# Patient Record
Sex: Male | Born: 1950
Health system: Southern US, Community
[De-identification: ages and names within clinical notes are randomized; demographics above are authoritative.]

## PROBLEM LIST (undated history)

## (undated) DIAGNOSIS — Z8709 Personal history of other diseases of the respiratory system: Secondary | ICD-10-CM

## (undated) DIAGNOSIS — I700 Atherosclerosis of aorta: Secondary | ICD-10-CM

## (undated) DIAGNOSIS — I10 Essential (primary) hypertension: Secondary | ICD-10-CM

## (undated) DIAGNOSIS — I8390 Asymptomatic varicose veins of unspecified lower extremity: Secondary | ICD-10-CM

## (undated) DIAGNOSIS — K7689 Other specified diseases of liver: Secondary | ICD-10-CM

## (undated) DIAGNOSIS — K458 Other specified abdominal hernia without obstruction or gangrene: Secondary | ICD-10-CM

## (undated) DIAGNOSIS — K219 Gastro-esophageal reflux disease without esophagitis: Secondary | ICD-10-CM

## (undated) DIAGNOSIS — I219 Acute myocardial infarction, unspecified: Secondary | ICD-10-CM

## (undated) DIAGNOSIS — M1610 Unilateral primary osteoarthritis, unspecified hip: Secondary | ICD-10-CM

## (undated) DIAGNOSIS — I255 Ischemic cardiomyopathy: Secondary | ICD-10-CM

## (undated) DIAGNOSIS — I2510 Atherosclerotic heart disease of native coronary artery without angina pectoris: Secondary | ICD-10-CM

## (undated) DIAGNOSIS — M4306 Spondylolysis, lumbar region: Secondary | ICD-10-CM

## (undated) DIAGNOSIS — K5730 Diverticulosis of large intestine without perforation or abscess without bleeding: Secondary | ICD-10-CM

## (undated) DIAGNOSIS — C801 Malignant (primary) neoplasm, unspecified: Secondary | ICD-10-CM

## (undated) DIAGNOSIS — M5136 Other intervertebral disc degeneration, lumbar region: Secondary | ICD-10-CM

## (undated) DIAGNOSIS — L719 Rosacea, unspecified: Secondary | ICD-10-CM

## (undated) DIAGNOSIS — E785 Hyperlipidemia, unspecified: Secondary | ICD-10-CM

## (undated) HISTORY — PX: OTHER SURGICAL HISTORY: SHX169

## (undated) HISTORY — PX: CHOLECYSTECTOMY: SHX55

## (undated) HISTORY — DX: Rosacea, unspecified: L71.9

## (undated) HISTORY — DX: Hyperlipidemia, unspecified: E78.5

## (undated) HISTORY — DX: Essential (primary) hypertension: I10

## (undated) HISTORY — PX: TONSILLECTOMY: SUR1361

## (undated) HISTORY — DX: Atherosclerotic heart disease of native coronary artery without angina pectoris: I25.10

## (undated) HISTORY — PX: HAND SURGERY: SHX662

---

## 1998-04-05 ENCOUNTER — Ambulatory Visit (HOSPITAL_COMMUNITY): Admission: RE | Admit: 1998-04-05 | Discharge: 1998-04-05 | Payer: Self-pay | Admitting: Specialist

## 1998-04-05 ENCOUNTER — Encounter: Payer: Self-pay | Admitting: Specialist

## 1998-10-31 ENCOUNTER — Encounter: Payer: Self-pay | Admitting: Family Medicine

## 1998-10-31 ENCOUNTER — Encounter: Admission: RE | Admit: 1998-10-31 | Discharge: 1998-10-31 | Payer: Self-pay | Admitting: Family Medicine

## 2001-07-29 ENCOUNTER — Other Ambulatory Visit: Admission: RE | Admit: 2001-07-29 | Discharge: 2001-07-29 | Payer: Self-pay | Admitting: Obstetrics and Gynecology

## 2002-01-03 ENCOUNTER — Ambulatory Visit (HOSPITAL_BASED_OUTPATIENT_CLINIC_OR_DEPARTMENT_OTHER): Admission: RE | Admit: 2002-01-03 | Discharge: 2002-01-03 | Payer: Self-pay | Admitting: *Deleted

## 2002-09-10 ENCOUNTER — Encounter: Payer: Self-pay | Admitting: Emergency Medicine

## 2002-09-10 ENCOUNTER — Inpatient Hospital Stay (HOSPITAL_COMMUNITY): Admission: EM | Admit: 2002-09-10 | Discharge: 2002-09-11 | Payer: Self-pay | Admitting: Emergency Medicine

## 2002-09-10 ENCOUNTER — Encounter: Payer: Self-pay | Admitting: General Surgery

## 2002-09-10 ENCOUNTER — Encounter (INDEPENDENT_AMBULATORY_CARE_PROVIDER_SITE_OTHER): Payer: Self-pay | Admitting: *Deleted

## 2005-06-08 ENCOUNTER — Emergency Department (HOSPITAL_COMMUNITY): Admission: EM | Admit: 2005-06-08 | Discharge: 2005-06-09 | Payer: Self-pay | Admitting: Emergency Medicine

## 2007-01-08 DIAGNOSIS — I219 Acute myocardial infarction, unspecified: Secondary | ICD-10-CM

## 2007-01-08 HISTORY — DX: Acute myocardial infarction, unspecified: I21.9

## 2007-06-08 HISTORY — PX: CORONARY ANGIOPLASTY WITH STENT PLACEMENT: SHX49

## 2007-06-19 ENCOUNTER — Inpatient Hospital Stay (HOSPITAL_COMMUNITY): Admission: EM | Admit: 2007-06-19 | Discharge: 2007-06-23 | Payer: Self-pay | Admitting: Emergency Medicine

## 2007-06-19 ENCOUNTER — Ambulatory Visit: Payer: Self-pay | Admitting: Cardiology

## 2007-06-23 ENCOUNTER — Encounter: Payer: Self-pay | Admitting: Cardiology

## 2007-06-23 ENCOUNTER — Ambulatory Visit: Payer: Self-pay | Admitting: Vascular Surgery

## 2007-07-07 ENCOUNTER — Ambulatory Visit: Payer: Self-pay | Admitting: Cardiology

## 2007-07-09 ENCOUNTER — Encounter (HOSPITAL_COMMUNITY): Admission: RE | Admit: 2007-07-09 | Discharge: 2007-10-14 | Payer: Self-pay | Admitting: Cardiology

## 2007-08-18 ENCOUNTER — Ambulatory Visit: Payer: Self-pay | Admitting: Cardiology

## 2007-08-18 LAB — CONVERTED CEMR LAB
ALT: 21 units/L (ref 0–53)
AST: 20 units/L (ref 0–37)
Albumin: 4 g/dL (ref 3.5–5.2)
Alkaline Phosphatase: 59 units/L (ref 39–117)
Bilirubin, Direct: 0.1 mg/dL (ref 0.0–0.3)
Cholesterol: 123 mg/dL (ref 0–200)
HDL: 40 mg/dL (ref >39.0)
LDL Cholesterol: 69 mg/dL (ref 0–99)
Total Bilirubin: 1 mg/dL (ref 0.3–1.2)
Total CHOL/HDL Ratio: 3.1
Total Protein: 6.4 g/dL (ref 6.0–8.3)
Triglycerides: 68 mg/dL (ref 0–149)
VLDL: 14 mg/dL (ref 0–40)

## 2007-10-13 ENCOUNTER — Encounter: Payer: Self-pay | Admitting: Cardiology

## 2007-10-13 ENCOUNTER — Ambulatory Visit: Payer: Self-pay

## 2007-10-15 ENCOUNTER — Encounter (HOSPITAL_COMMUNITY): Admission: RE | Admit: 2007-10-15 | Discharge: 2007-11-07 | Payer: Self-pay | Admitting: Cardiology

## 2008-03-08 ENCOUNTER — Encounter: Payer: Self-pay | Admitting: Cardiology

## 2008-03-08 ENCOUNTER — Ambulatory Visit: Payer: Self-pay | Admitting: Cardiology

## 2008-03-08 DIAGNOSIS — I10 Essential (primary) hypertension: Secondary | ICD-10-CM | POA: Insufficient documentation

## 2008-03-08 DIAGNOSIS — E785 Hyperlipidemia, unspecified: Secondary | ICD-10-CM | POA: Insufficient documentation

## 2008-03-08 DIAGNOSIS — L719 Rosacea, unspecified: Secondary | ICD-10-CM | POA: Insufficient documentation

## 2008-03-08 DIAGNOSIS — I2510 Atherosclerotic heart disease of native coronary artery without angina pectoris: Secondary | ICD-10-CM | POA: Insufficient documentation

## 2008-04-20 ENCOUNTER — Encounter: Payer: Self-pay | Admitting: Cardiology

## 2008-09-06 ENCOUNTER — Telehealth: Payer: Self-pay | Admitting: Cardiology

## 2008-10-03 ENCOUNTER — Encounter (INDEPENDENT_AMBULATORY_CARE_PROVIDER_SITE_OTHER): Payer: Self-pay | Admitting: *Deleted

## 2009-02-01 ENCOUNTER — Encounter (INDEPENDENT_AMBULATORY_CARE_PROVIDER_SITE_OTHER): Payer: Self-pay | Admitting: *Deleted

## 2009-02-19 IMAGING — CR DG CHEST 1V PORT
1 series · 1 of 1 positions shown · non-contrast
Comparison: None available.

CLINICAL DATA: Chest pain.

PORTABLE CHEST - 1 VIEW

[view not recorded]
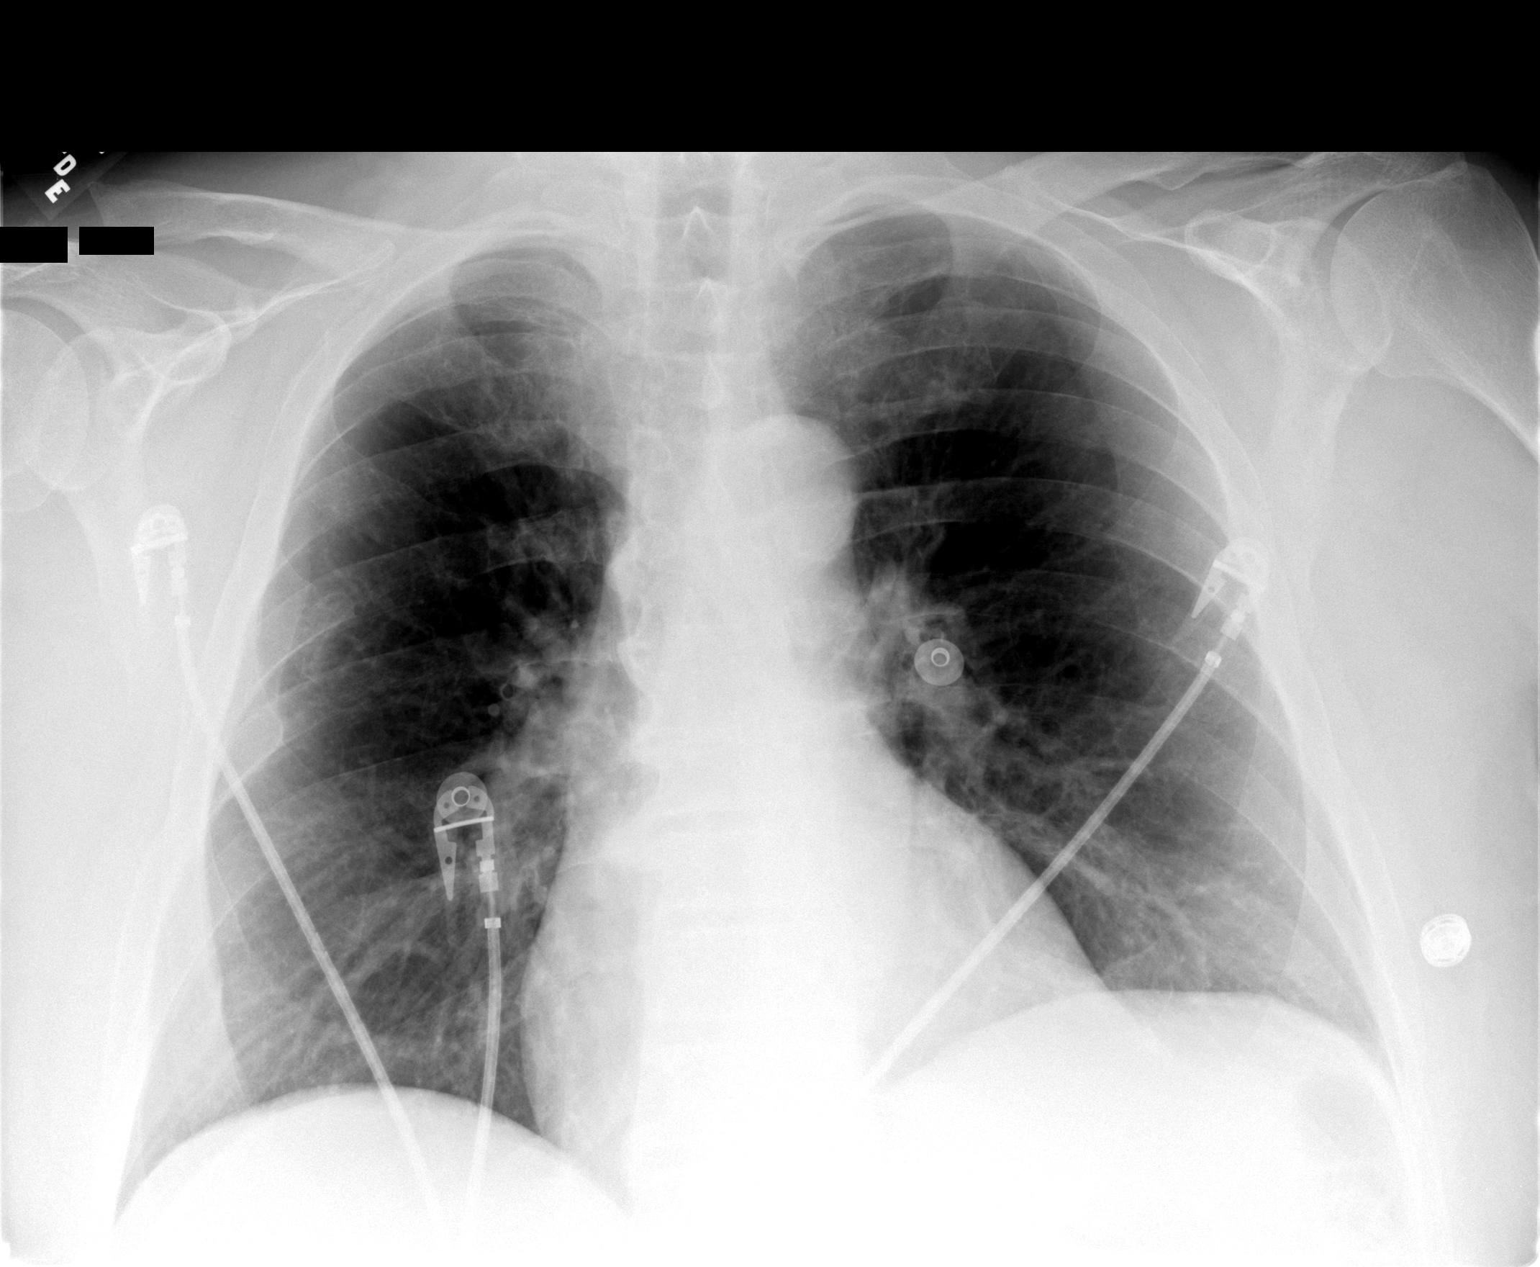

[1 of 1 positions shown; findings below may reference images not displayed]

FINDINGS: Lungs are clear.  Heart size is normal.  No pleural
effusion or focal bony abnormality.
IMPRESSION: No acute disease.

## 2009-03-14 ENCOUNTER — Telehealth: Payer: Self-pay | Admitting: Cardiology

## 2009-05-17 ENCOUNTER — Encounter (INDEPENDENT_AMBULATORY_CARE_PROVIDER_SITE_OTHER): Payer: Self-pay | Admitting: *Deleted

## 2009-05-30 ENCOUNTER — Ambulatory Visit: Payer: Self-pay | Admitting: Cardiology

## 2009-05-31 ENCOUNTER — Encounter (INDEPENDENT_AMBULATORY_CARE_PROVIDER_SITE_OTHER): Payer: Self-pay | Admitting: *Deleted

## 2009-05-31 LAB — CONVERTED CEMR LAB
ALT: 25 units/L (ref 0–53)
AST: 24 units/L (ref 0–37)
Albumin: 4.2 g/dL (ref 3.5–5.2)
Alkaline Phosphatase: 61 units/L (ref 39–117)
BUN: 19 mg/dL (ref 6–23)
Bilirubin, Direct: 0.1 mg/dL (ref 0.0–0.3)
CO2: 28 meq/L (ref 19–32)
Calcium: 9.3 mg/dL (ref 8.4–10.5)
Chloride: 107 meq/L (ref 96–112)
Cholesterol: 132 mg/dL (ref 0–200)
Creatinine, Ser: 0.9 mg/dL (ref 0.4–1.5)
GFR calc non Af Amer: 94.38 mL/min (ref >60)
Glucose, Bld: 110 mg/dL — ABNORMAL HIGH (ref 70–99)
HDL: 54 mg/dL (ref >39.00)
LDL Cholesterol: 70 mg/dL (ref 0–99)
Potassium: 5 meq/L (ref 3.5–5.1)
Sodium: 141 meq/L (ref 135–145)
Total Bilirubin: 0.8 mg/dL (ref 0.3–1.2)
Total CHOL/HDL Ratio: 2
Total Protein: 6.5 g/dL (ref 6.0–8.3)
Triglycerides: 39 mg/dL (ref 0.0–149.0)
VLDL: 7.8 mg/dL (ref 0.0–40.0)

## 2009-06-01 ENCOUNTER — Ambulatory Visit: Payer: Self-pay | Admitting: Cardiology

## 2009-06-12 ENCOUNTER — Ambulatory Visit: Payer: Self-pay | Admitting: Cardiology

## 2009-06-14 LAB — CONVERTED CEMR LAB
BUN: 19 mg/dL (ref 6–23)
CO2: 31 meq/L (ref 19–32)
Calcium: 9.3 mg/dL (ref 8.4–10.5)
Chloride: 106 meq/L (ref 96–112)
Creatinine, Ser: 0.9 mg/dL (ref 0.4–1.5)
GFR calc non Af Amer: 87.45 mL/min (ref >60)
Glucose, Bld: 83 mg/dL (ref 70–99)
Potassium: 4.3 meq/L (ref 3.5–5.1)
Sodium: 142 meq/L (ref 135–145)

## 2009-08-10 ENCOUNTER — Telehealth: Payer: Self-pay | Admitting: Cardiology

## 2009-11-24 ENCOUNTER — Telehealth: Payer: Self-pay | Admitting: Cardiology

## 2009-12-15 ENCOUNTER — Telehealth: Payer: Self-pay | Admitting: Cardiology

## 2010-01-22 ENCOUNTER — Telehealth: Payer: Self-pay | Admitting: Cardiology

## 2010-02-06 NOTE — Progress Notes (Signed)
Summary: rx refill   Phone Note Refill Request Call back at [phone] Message from:  Patient  Refills Requested: Medication #1:  PLAVIX 75 MG TABS 1 tab qd   Supply Requested: 6 months needs refill   [facility]   Method Requested: Fax to Local Pharmacy Initial call taken by: Burnard Leigh,  September 06, 2008 9:47 AM  Follow-up for Phone Call        called pt to let him no i received the mess Follow-up by: Kem Parkinson,  September 06, 2008 12:14 PM    Prescriptions: PLAVIX 75 MG TABS (CLOPIDOGREL BISULFATE) 1 tab qd  #30 x 12   Entered by:   Kem Parkinson   Authorized by:   Ferman Hamming, MD, [facility]   Signed by:   Kem Parkinson on 09/06/2008   Method used:   [facility]        [facility] [address] [phone]* (retail)       [address]       [place], [place]  [address]       Ph: [phone] or [phone]       Fax: [phone]   RxID:   [id number]

## 2010-02-06 NOTE — Progress Notes (Signed)
Summary: medication   Phone Note Call from Patient Call back at Home Phone [phone]   Caller: Patient Summary of Call: pt needs to know if he can take a medication. wouldnt tell me which one. Initial call taken by: Edman Circle,  December 15, 2009 4:59 PM  Follow-up for Phone Call        spoke to pt, he has a severe contusion of his left leg. it is swollen and dr Lequita Halt is unsure of what to give him because of the plavix. wanted to know what dr Jens Som would recommend. will discuss with dr Jens Som Deliah Goody, RN  December 15, 2009 5:54 PM  discussed with dr Jens Som, pt given okay to take aleve twice daily for 7 days at the most. pt voiced understanding Deliah Goody, RN  December 15, 2009 6:00 PM

## 2010-02-06 NOTE — Assessment & Plan Note (Signed)
Summary: Cardiology Office Visit   Chief Complaint:  6 month f/u..no complaints.  History of Present Illness: 60 year old male with past medical history of coronary artery disease. In June of 2009 the patient had drug-eluting stents to his LAD and right coronary artery. His ejection fraction at that time was 40%. His last echocardiogram was performed on October 13, 2007. His LV function was normal. Since he was last seen he is doing well symptomatically. He denies any dyspnea, chest pain, palpitations or syncope no pedal edema.    Updated Prior Medication List: [facility] ASP[facility]IN EC 325 MG TBEC (ASPIRIN) 1 tab qd PLAVIX 75 MG TABS (CLOPIDOGREL BISULFATE) 1 tab qd LISINOPRIL 2.5 MG TABS (LISINOPRIL) 1 tab qd LIPITOR 40 MG TABS (ATORVASTATIN CALCIUM) 1 tab qd COREG 3.125 MG TABS (CARVEDILOL) 1 by mouth bid   Past Medical History:    Reviewed history from 03/08/2008 and no changes required:       CAD       Hyperlipidemia       Myocardial Infarction (S/P DES to LAD and RCA 06/22/07)       rosacea  Family History:    Reviewed history from 03/08/2008 and no changes required:       Father:alive coronary artery disease  stent       Mother:decease dementia       Siblings:alive and well    Vital Signs:  Patient Profile:   60 Years Old Male Height:     70 inches Weight:      229 pounds Pulse rate:   58 / minute BP sitting:   158 / 82  (left arm)  Vitals Entered By: Danielle Rankin, CMA (March 08, 2008 11:24 AM)                Physical Exam  Head:     HEENT normal with normal eyelids. Neck:     supple Lungs:     clear to auscultation Heart:     regular rate and rhythm Abdomen:     soft Extremities:     no edema    Impression & Recommendations:  Problem # 1:  CAD, NATIVE VESSEL (ICD-414.01) the patient has had no chest pain or shortness of breath. We will continue with medical therapy including his aspirin but I will decrease this to 81 mg p.o. daily. He will continue on his  Plavix, beta blocker, ACE inhibitor and statin.    [facility] Aspirin Ec 325 Mg Tbec (Aspirin) .[name]... 1 tab qd    Plavix 75 Mg Tabs (Clopidogrel bisulfate) .[name]... 1 tab qd    Lisinopril 2.5 Mg Tabs (Lisinopril) .[name]... 1 tab qd    Coreg 3.125 Mg Tabs (Carvedilol) .[name]... 1 by mouth bid   Problem # 2:  HYPERLIPIDEMIA-MIXED (ICD-272.4) the patient will continue on his Lipitor and we will check lipids and liver and adjust as indicated. His updated medication list for this problem includes:    Lipitor 40 Mg Tabs (Atorvastatin calcium) .[name]... 1 tab qd   Problem # 3:  HYPERTENSION, BENIGN (ICD-401.1) the patient's blood pressure is elevated today. We will increase his lisinopril to 10 mg p.o. daily. We will check a BMET in one week to follow his potassium and renal function. His updated medication list for this problem includes:    [facility] Aspirin Ec 325 Mg Tbec (Aspirin) .[name]... 1 tab qd    Lisinopril 2.5 Mg Tabs (Lisinopril) .[name]... 1 tab qd    Coreg 3.125 Mg Tabs (Carvedilol) .Marland KitchenMarland KitchenMarland KitchenMarland Kitchen 1  by mouth bid
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
## 2010-02-06 NOTE — Miscellaneous (Signed)
  Clinical Lists Changes  Medications: Changed medication from LISINOPRIL 2.5 MG TABS (LISINOPRIL) 1 tab qd to LISINOPRIL 10 MG TABS (LISINOPRIL) take one tablet  once daily - Signed Rx of LISINOPRIL 10 MG TABS (LISINOPRIL) take one tablet  once daily;  #90 x 1;  Signed;  Entered by: Oswald Hillock;  Authorized by: Ferman Hamming, MD, [facility];  Method used: Faxed to [facility]  [facility] [address] [phone]*, [address], [place], [place], [place]  [address], Ph: [phone] or [phone], Fax: [phone]    Prescriptions: LISINOPRIL 10 MG TABS (LISINOPRIL) take one tablet  once daily  #90 x 1   Entered by:   Oswald Hillock   Authorized by:   Ferman Hamming, MD, [facility]   Signed by:   Oswald Hillock on 02/01/2009   Method used:   Faxed to ...       [facility]  [facility] [address] [phone]* (retail)       [address]       [place], [place]  [address]       Ph: [phone] or [phone]       Fax: [phone]   RxID:   [id number]

## 2010-02-06 NOTE — Letter (Signed)
Summary: Appointment - Reminder 2  [facility], Main Office  [address]   [place], [place] [address]   Phone: [phone]  Fax: [phone]     October 03, 2008 MRN: [id number]   [facility] [address] [place], [place]  [address]   Dear Jeffery [name],  Our records indicate that it is time to schedule a follow-up appointment.Dr.CRENSHAW recommended that you follow up with [place] in DECEMBER,2010. It is very important that we reach you to schedule this appointment. We look forward to participating in your health care needs. Please contact us at the number listed above at your earliest convenience to schedule your appointment.  If you are unable to make an appointment at this time, give [place] a call so we can update our records.     Sincerely, GESILA,DAVIS  Glass blower/designer

## 2010-02-06 NOTE — Progress Notes (Signed)
Summary: meds   Phone Note Call from Patient Call back at Home Phone [phone] Call back at [phone]   Caller: Patient Reason for Call: Talk to Nurse Summary of Call: pt going deep sea fishing and needs script for patches for sea sickness [facility]  Initial call taken by: Edman Circle,  August 10, 2009 1:40 PM  Follow-up for Phone Call        spoke with pt, he is going fishing and dr Jens Som had mentioned he would give him the patches for motion sickness. will ask dr Jens Som Deliah Goody, RN  August 10, 2009 5:28 PM  Pt goes fishing on the 11th Follow-up by: Kem Parkinson,  August 11, 2009 1:48 PM  Additional Follow-up for Phone Call Additional follow up Details #1::        scopolamine patch as directed (1 patch only) Ferman Hamming, MD, [facility]  August 13, 2009 4:04 PM      Appended Document: meds Left message to call back

## 2010-02-06 NOTE — Letter (Signed)
Summary: Custom - Lipid  [facility], Main Office  [address]   [place], [place] [address]   Phone: [phone]  Fax: [phone]     May 31, 2009 MRN: [id number]   [facility] [address] [place], [place]  [address]   Dear Mr. Maugeri,  We have reviewed your cholesterol results.  They are as follows:     Total Cholesterol:    132 (Desirable: less than 200)       HDL  Cholesterol:     54.00  (Desirable: greater than 40 for men and 50 for women)       LDL Cholesterol:       70  (Desirable: less than 100 for low risk and less than 70 for moderate to high risk)       Triglycerides:       39.0  (Desirable: less than 150)  Our recommendations include:These numbers look good. Continue on the same medicine. Sodium, potassium, kidney and Liver function are normal. Take care, Dr. Darel Hong.    Call our office at the number listed above if you have any questions.  Lowering your LDL cholesterol is important, but it is only one of a large number of "risk factors" that may indicate that you are at risk for heart disease, stroke or other complications of hardening of the arteries.  Other risk factors include:   A.  Cigarette Smoking* B.  High Blood Pressure* C.  Obesity* D.   Low HDL Cholesterol (see yours above)* E.   Diabetes Mellitus (higher risk if your is uncontrolled) F.  Family history of premature heart disease G.  Previous history of stroke or cardiovascular disease    *These are risk factors YOU HAVE CONTROL OVER.  For more information, visit .  There is now evidence that lowering the TOTAL CHOLESTEROL AND LDL CHOLESTEROL can reduce the risk of heart disease.  The American Heart Association recommends the following guidelines for the treatment of elevated cholesterol:  1.  If there is now current heart disease and less than two risk factors, TOTAL CHOLESTEROL should be less than 200 and LDL CHOLESTEROL should be less than 100. 2.  If there is  current heart disease or two or more risk factors, TOTAL CHOLESTEROL should be less than 200 and LDL CHOLESTEROL should be less than 70.  A diet low in cholesterol, saturated fat, and calories is the cornerstone of treatment for elevated cholesterol.  Cessation of smoking and exercise are also important in the management of elevated cholesterol and preventing vascular disease.  Studies have shown that 30 to 60 minutes of physical activity most days can help lower blood pressure, lower cholesterol, and keep your weight at a healthy level.  Drug therapy is used when cholesterol levels do not respond to therapeutic lifestyle changes (smoking cessation, diet, and exercise) and remains unacceptably high.  If medication is started, it is important to have you levels checked periodically to evaluate the need for further treatment options.  Thank you,    [facility] Team

## 2010-02-06 NOTE — Progress Notes (Signed)
Summary: ok to take alleve?   Phone Note Call from Patient   Caller: Patient [phone] Reason for Call: Talk to Nurse Summary of Call: pt calling to see if ok to take alleve to due med he is taking ,takes 1 every twelve hours as directed Initial call taken by: Glynda Jaeger,  November 24, 2009 4:53 PM  Follow-up for Phone Call        Called patient back. He states that he is seeing Dr. Lequita Halt for knee problems and the PA who saw him thought he should not take Aleve due to possible bleeding risks because he takes Plavix and Asa. I advised him that he should not take Aleve as stated by Pa at [facility]. Advised he can take EX Strength Tylenol or Tylenol arthritis instead. Patient verbalized understanding.     Layne Benton, RN, BSN  November 24, 2009 5:16 PM

## 2010-02-06 NOTE — Letter (Signed)
Summary: Appointment - Missed  [facility], Main Office  [address]   [place], [place] [address]   Phone: [phone]  Fax: [phone]     May 17, 2009 MRN: [id number]   [facility] [address] [place], [place]  [address]   Dear [name],  Our records indicate you missed your appointment on 05/11/09  with Dr. Daleen Squibb  . It is very important that we reach you to reschedule this appointment. We look forward to participating in your health care needs. Please contact us at the number listed above at your earliest convenience to reschedule this appointment.     Sincerely,  [name]  [facility] Scheduling Team

## 2010-02-06 NOTE — Progress Notes (Signed)
Summary: lipitor from 80 mg to 40 mg   Medications Added LIPITOR 80 MG TABS (ATORVASTATIN CALCIUM) Take one tablet by mouth daily.       Phone Note Call from Patient Call back at Home Phone [phone] Call back at [phone]   Caller: Patient Reason for Call: Talk to Nurse Details for Reason: Per pt calling, why was lipitor was changed from 80 mg to 40 mg.   Initial call taken by: [name],  March 14, 2009 2:03 PM  Follow-up for Phone Call        spoke with pt, he has been taking 80mg  of lipitor. he will have labs prior to appt in may with dr Jens Som. Deliah Goody, RN  March 14, 2009 3:53 PM     New/Updated Medications: LIPITOR 80 MG TABS (ATORVASTATIN CALCIUM) Take one tablet by mouth daily. Prescriptions: LIPITOR 80 MG TABS (ATORVASTATIN CALCIUM) Take one tablet by mouth daily.  #30 x 12   Entered by:   Deliah Goody, RN   Authorized by:   Ferman Hamming, MD, [facility]   Signed by:   Deliah Goody, RN on 03/14/2009   Method used:   Electronically to        [facility] [address] [phone]* (retail)       [address]       [place], [place]  [address]       Ph: [phone] or [phone]       Fax: [phone]   RxID:   [id number]

## 2010-02-08 NOTE — Progress Notes (Signed)
Summary:  can take generic lipitor?-out of med and needs to pick up rx    Phone Note Call from Patient   Caller: Patient [phone] Reason for Call: Talk to Nurse Summary of Call: lipitor was changed to generic-pt states pharmacy wanted him to ok this with dr that he take generic-pt out of med and needs to know asap so he can pick it up today Initial call taken by: Glynda Jaeger,  January 22, 2010 3:43 PM  Follow-up for Phone Call        ok per D mathis to give generic  lipitor which is Atorvastatin Calcium..called and spoke with pharmacist gave his 1 year supply Follow-up by: Kem Parkinson,  January 22, 2010 4:07 PM
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
## 2010-05-22 NOTE — Cardiovascular Report (Signed)
NAME:  Jeffery [name], Jeffery [name] NO.:  [id number]   MEDICAL RECORD NO.:  [id number]          PATIENT TYPE:  INP   LOCATION:  6531                         FACILITY:  [facility]   PHYSICIAN:  Arturo Morton. Riley Kill, MD, FACCDATE OF BIRTH:  August 24, 1950   DATE OF PROCEDURE:  06/22/2007  DATE OF DISCHARGE:                            CARDIAC CATHETERIZATION   INDICATIONS:  Jeffery [name] is a 60 year old who underwent stenting of the  LAD for ruptured plaque in the mid LAD.  He had residual disease in the  right coronary.  We discussed medical therapy versus percutaneous  intervention, but the lesion appeared to be hazy and disrupted.  As a  result, he was brought back to the catheterization laboratory for relook  and possible percutaneous coronary intervention.  Risks, benefits, and  alternatives were discussed extensively with the patient and his family.   PROCEDURE:  1. Percutaneous stenting of the distal right coronary artery.  2. Selective coronary angiography of the left coronary artery.   DESCRIPTION OF PROCEDURE:  The patient was brought to the cath lab and  prepped and draped in the usual fashion.  Through an anterior puncture  using a Smart needle, the left femoral artery was entered.  A 6-French  sheath was then placed.  Following this, bivalirudin was given according  to protocol.  A left diagnostic L4 diagnostic catheter was utilized to  reevaluate the LAD.  The stent appeared excellent without any recoil, or  residual haziness.  There was no evidence of edge tear.  As a result, we  proceeded on to the right coronary.  The right coronary was initially  engaged using a JR-4 guiding catheter with side holes.  We used a  Prowater wire to get down into the distal right coronary.  We then  placed a 20 mm x 2.25 balloon distally to try to measure the length and  distance of the stent required.  Predilatation was then done at the most  severe lesion.  Following this, we attempted to pass a  28 x 2.5 Promus  drug-eluting stent.  We could not get down beyond the calcified  midvessel.  A buddy wire, BMW wire was placed into the distal right  coronary artery.  Using the buddy wire, we were able to get the stent  down to the distal lesion.  We spent considerable time trying to place  the stent appropriately to cover both the distal and more proximal  lesions adequately.  The distal lesion appeared to be 2.5 at maximum,  and it was felt that 2.75 would be oversized.  The stent was then  deployed at approximately 14-15 atmospheres.  There was marked  improvement in the appearance of the artery.  A 3.0 Quantum Maverick was  then passed, and we ultimately were able to get down into the distal  vessel at which time we did post-dilatations throughout.  We made  especially sure to stay proximal to the distal edge of the stent to  avoid oversizing.  Post-dilatations were done up to 12-13 atmospheres  using 3-0 semi-compliant balloon.  There  was improvement in the  appearance of the artery.  After this, we gave intracoronary  nitroglycerin.  There was evidence of disease at the distal end of the  stent as it bifurcates into the PDA and PLA, both of which have some  disease as well.  However, the lumen does not appear to be compromised  and the taper from the stent appears to be satisfactory.  As a result,  the procedure was completed.  The wire was then removed and final  angiographic views were obtained.  There were no major complications.  Following this, we prepped the left groin and gowns and gloves were  changed.  Angio-Seal device was deployed in the left femoral artery with  excellent hemostasis.  Bivalirudin had been discontinued.   ANGIOGRAPHIC DATA:  The right coronary artery is a moderately diseased  vessel.  At the proximal mid junction, there was about 20%-30% eccentric  plaquing.  This was less prominent after administration of intracoronary  nitroglycerin.  There was a  calcified focal mid lesion that appears to  be about 50%.  It does not appear to be highly obstructive.  There was a  third lesion of about 50% which is eccentric in the distal portion of  the midvessel.  Following this, there appears to be a disrupted lesion  with probably 80% narrowing and then a second lesion with about 80%  narrowing.  The drug-eluting stent was placed across both of these  areas.  There is disease distal to the stent placement site as it leads  into the posterolateral and posterior descending branches.  The area of  diffuse disease was reduced from 80% to 0%.   The left anterior descending artery courses to the apex.  The previous  area of high-grade thrombotic eccentric plaque distal to the septal  perforator now was widely patent without evidence of significant  renarrowing.  There was 30%-40% narrowing in the LAD just past the  diagonal branch and the distal LAD wraps the apex and the area of  previously noted distal disease appears to be somewhat improved.   CONCLUSION:  1. Continued patency of the LAD stent from the Friday procedure.  2. Successful percutaneous stenting of the distal right coronary      artery.   DISPOSITION:  The patient will be treated with aspirin and Plavix.  Given the two-vessel disease, I would be inclined to keep him on this.  The patient does have residual disease in the circumflex coronary artery  as well.  He will be followed by Dr. Jens Som.      Arturo Morton. Riley Kill, MD, [facility]  Electronically Signed     TDS/MEDQ  D:  06/22/2007  T:  06/23/2007  Job:  [id number]

## 2010-05-22 NOTE — Cardiovascular Report (Signed)
NAME:  Jeffery [name], AHLERS NO.:  [id number]   MEDICAL RECORD NO.:  [id number]          PATIENT TYPE:  INP   LOCATION:  6531                         FACILITY:  [facility]   PHYSICIAN:  Arturo Morton. Riley Kill, MD, FACCDATE OF BIRTH:  [birth year]   DATE OF PROCEDURE:  06/22/2007  DATE OF DISCHARGE:                            CARDIAC CATHETERIZATION   INDICATIONS:  The patient is a 60 year old who was brought from the  emergency room with a non-ST-elevation myocardial infarction.  Enzymes  were positive.  His pain is now relieved.  He was brought to the cath  lab for further evaluation.   PROCEDURES:  1. Left heart catheterization.  2. Selective coronary arteriography.  3. Selective left ventriculography.  4. Intravascular ultrasound-guided percutaneous coronary intervention      of left anterior descending artery with a drug-eluting stent.   DESCRIPTION OF PROCEDURE:  The patient was brought to the cath lab and  prepped and draped in usual fashion.  Through an anterior puncture, the  femoral artery was entered.  A 5-French sheath was then placed.  Following this, views of the left and right coronary arteries were  obtained in multiple angiographic projections.  Central aortic and left  ventricular pressures were measured.  Pigtail ventriculography was  performed in the RAO projection.  There was disease of the distal right  and moderate disease of the circumflex marginal and disrupted lesion in  the mid left anterior descending artery.  There was also evidence of  wall motion abnormality in this territory.  After discussion of the  options, percutaneous coronary intervention was recommended.  With this,  we then used a JL-4 guiding catheter, eptifibatide and heparin were  utilized.  ACT was checked and found to be appropriate.  The lesion was  crossed with a Promus wire and then predilated.  Intravascular  ultrasound was then used to guide both the length and the size of the  stent.  Following this, a 3 x 18 Promus drug-eluting stent was deployed  at high pressure.  The stent was deployed at about 15-16 atmospheres.  Following this, we used a 3.5 Quantum Maverick to post-dilate the stent.  Following this, intravascular ultrasound was performed which  demonstrated excellent apposition.  There was 1 small strap proximally  extending into a larger angiographic area, but the overall stent  appeared to be well deployed.  There was an excellent angiographic  result.  All catheters were then subsequently removed and the femoral  sheath was sewn into place.  There were no major complications.   HEMODYNAMIC DATA:  1. Central aortic pressure was 130-121/70, mean 90.  2. Left ventricular pressure was 133/40.  3. There was no gradient pullback across the aortic valve.   ANGIOGRAPHIC DATA:  1. The left main was free of critical disease.  2. The LAD tapered and then there was a 90% eccentric shelf after a      septal perforator.  This appeared to be disrupted.  Distal to the      major diagonal is about 40% narrowing and the second area of about  50% narrowing.  3. The circumflex provides a first marginal that has some segmental      plaque of about 75%.  This is a small-to-moderate sized branch.      The remainder of the AV circumflex is without critical disease.  4. The right coronary artery has about 20% narrowing proximally.      There is a 50% area with calcified focal disease in the midportion      and about 50% to 60% eccentric plaque distally at the distal      portion of the mid vessel.  Distally, there are tandem lesions in      the midportion of the distal vessel and also just before the PDA      that appeared to be potentially disrupted.  These measured between      50% and 70%.  There is also about 50% to 70% narrowing leading into      the posterolateral branch.  There is a small amount of posterior      descending disease as well.   Ventriculography  in the RAO projection reveals a wall motion abnormality  involving the mid distal anterolateral apical segment.  Ejection  fraction can be estimated at 40%.   Following percutaneous stenting, the LAD is reduced from 90% to 0%.  There is no evidence of edge tear.  There is smooth angiographic result.   Intracoronary ultrasound demonstrated a disrupted lesion that was  measured, this was post-PCI.  Following stent deployment, there was no  evidence of significant stent mal-apposition.  There was 1 small strap  at the proximal end of the stent noted, but otherwise the stent is fully  expanded.      Arturo Morton. Riley Kill, MD, [facility]  Electronically Signed     TDS/MEDQ  D:  06/22/2007  T:  06/23/2007  Job:  [id number]

## 2010-05-22 NOTE — Assessment & Plan Note (Signed)
[name]                            CARDIOLOGY OFFICE NOTE   NAME:Jeffery, [name]                        MRN:          [id number]  DATE:07/07/2007                            DOB:          05/16/1950    [name] is a very pleasant 60 year old gentleman who was recently  admitted to [name] on June 19, 2007, with chest pain.  The  patient ruled in for myocardial infarction with a peak troponin I of  17.46.  He was taken urgently to cardiac catheterization lab.  He was  found to have a 50-70% stenosis in the right coronary artery.  He had  initial PCI of his LAD with a promise drug-eluting stent.  His ejection  fraction was 40%.  He was placed on medications and ultimately underwent  stage procedure to the right coronary lesion with a drug-eluting stent  as well.  Since then, he has done well.  There is no dyspnea, chest  pain, palpitations, or syncope.  There is no pedal edema.  Note, he does  not smoke.   MEDICATIONS:  1. Aspirin 325 mg p.o. daily.  2. Plavix 75 mg p.o. daily.  3. Lisinopril 2.5 mg p.o. daily.  4. Coreg 3.125 mg p.o. b.i.d.   PHYSICAL EXAMINATION:  VITAL SIGNS:  Blood pressure 132/73 and his pulse  is 61.  He weighs 239 pounds.  HEENT:  Normal.  NECK:  Supple with no bruits.  CHEST:  Clear.  CARDIOVASCULAR:  Regular rate and rhythm.  ABDOMEN:  No tenderness.  His groin showed no hematoma and no bruit  bilaterally.  EXTREMITIES:  No edema.   His electrocardiogram shows sinus rhythm at a rate of 59.  There is left  axis deviation.  There are nonspecific lateral T-wave changes.   DIAGNOSES:  1. Coronary artery disease - [name] is doing well with no chest pain      or shortness of breath.  He will continue on his aspirin, Plavix,      Coreg, and ACE inhibitor.  I am hesitant to advance his Coreg as he      was bradycardic in the [name].  Note, there is some confusion      about his statin.  We will begin Zocor  40 mg p.o. daily as well.      We will check lipids and liver in 6 weeks and adjust with a goal      LDL of less than 70.  2. Status post drug-eluting stent to the right coronary artery as well      as his LAD - He will need indefinite aspirin and Plavix use.  3. Remote history of tobacco use.  4. Hyperlipidemia - We will add Zocor and check lipids and liver in 6      weeks.  5. History of rosacea.   We will also plan to repeat an echocardiogram in 3 months to re-quantify  his LV function.  I have cleared him to return back to work.  I have  also stressed him diet and exercise.  We  will see him back in 6 months.     Madolyn Frieze Jens Som, MD, [facility]  Electronically Signed    BSC/MedQ  DD: 07/07/2007  DT: 07/08/2007  Job #: [id number]   cc:   C. Duane Lope, M.D.
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
## 2010-05-25 NOTE — Op Note (Signed)
NAME:  Jeffery [name], Jeffery [name] NO.:  [id number]   MEDICAL RECORD NO.:  [id number]                   PATIENT TYPE:  INP   LOCATION:  5733                                 FACILITY:  [facility]   PHYSICIAN:  Sharlet Salina T. Hoxworth, M.D.          DATE OF BIRTH:  04/04/1950   DATE OF PROCEDURE:  09/10/2002  DATE OF DISCHARGE:                                 OPERATIVE REPORT   PREOPERATIVE DIAGNOSIS:  Cholelithiasis and acute cholecystitis.   POSTOPERATIVE DIAGNOSIS:  Cholelithiasis and acute cholecystitis.   PROCEDURE:  Laparoscopic cholecystectomy.   SURGEON:  [name]. Hoxworth, M.D.   ASSISTANT:  Adolph Pollack, M.D.   ANESTHESIA:  General.   INDICATIONS FOR PROCEDURE:  [name] is a 60 year old white male who  presents with 24 hours of persistent, severe epigastric  pain. He has had a  workup including  a gallbladder  ultrasound showing cholelithiasis and a  thickened gallbladder wall. He is quite tender in the right upper quadrant.  A laparoscopic cholecystectomy  for acute cholecystitis has been recommended  and accepted. The nature of the procedure, indications, risks of  bleeding,  infection, bile leak and bile duct injury were discussed  and understood  preoperatively. He is now brought to the operating room for this procedure.   DESCRIPTION OF PROCEDURE:  The patient was brought to the operating room and  placed in the supine position on the operating table and general  endotracheal anesthesia was induced. The abdomen was sterilely prepped and  draped. He was already on broad-spectrum antibiotics. Local anesthesia was  used to infiltrate the trocar sites prior to the incision.   A 1-cm incision was made at the umbilicus and dissection was carried down to  the midline  fascia which was sharply incised for 1 cm and the peritoneum  was entered under direct vision. Through a mattress suture of 0 Vicryl the  Hasson trocar was placed and  pneumoperitoneum was established. Under direct  vision a 10-mm trocar was placed in the subxiphoid  area and two 5-mm  trocars along the right subcostal margin.   The gallbladder was visualized which was tensely distended and edematous and  acutely inflamed. Some omental adhesions were taken down and the gallbladder  was  decompressed with the needle aspirator. The fundus was grasped and  elevated out of the liver. Fibrofatty tissue  was stripped down from the  neck  of the gallbladder to the porta hepatis. The distal gallbladder  was  thoroughly dissected. Calot's triangle was completely dissected.   The cystic duct and cystic artery were identified  and a nice window was  created in Calot's triangle. When the anatomy was cleared the cystic artery  and cystic duct were doubly clipped, proximally clipped distally and  divided. The gallbladder  was then dissected free from its bed using hook  cautery and removed through the umbilicus.  The right upper quadrant was thoroughly irrigated and suctioned and complete  hemostasis was obtained in the gallbladder  bed. The trocars were removed  under direct vision. All CO2 was evacuated from the peritoneal cavity. The  mattress suture was secured to the umbilicus. The skin incisions were closed  with another subcuticular Monocryl and Steri-Strips.   All sponge, instrument and needle counts were correct. A dry sterile  dressing was applied.  The patient was taken to the recovery room in good condition.                                                [name]. Hoxworth, M.D.    Tory Emerald  D:  09/10/2002  T:  09/11/2002  Job:  [id number]

## 2010-06-04 ENCOUNTER — Other Ambulatory Visit: Payer: Self-pay | Admitting: Cardiology

## 2010-06-06 NOTE — Telephone Encounter (Signed)
Lisinopril  20 [facility] [address] [phone].

## 2010-08-18 ENCOUNTER — Other Ambulatory Visit: Payer: Self-pay | Admitting: Cardiology

## 2010-09-18 ENCOUNTER — Other Ambulatory Visit: Payer: Self-pay | Admitting: *Deleted

## 2010-09-18 MED ORDER — CLOPIDOGREL BISULFATE 75 MG PO TABS: 75.0000 mg | ORAL_TABLET | Freq: Every day | ORAL | Status: DC

## 2010-10-04 LAB — DIFFERENTIAL
Basophils Absolute: 0
Basophils Relative: 0
Eosinophils Absolute: 0.3
Eosinophils Relative: 4
Lymphocytes Relative: 22
Lymphs Abs: 1.7
Monocytes Absolute: 0.5
Monocytes Relative: 6
Neutro Abs: 5.3
Neutrophils Relative %: 68

## 2010-10-04 LAB — POCT I-STAT, CHEM 8
BUN: 13
Calcium, Ion: 1.25
Chloride: 104
Creatinine, Ser: 0.9
Glucose, Bld: 97
HCT: 34 — ABNORMAL LOW
Hemoglobin: 11.6 — ABNORMAL LOW
Potassium: 3.9
Sodium: 139
TCO2: 25

## 2010-10-04 LAB — BASIC METABOLIC PANEL
BUN: 13
BUN: 19
BUN: 9
CO2: 23
CO2: 27
CO2: 28
Calcium: 8.4
Calcium: 9.7
Calcium: 8.7
Chloride: 102
Chloride: 107
Chloride: 107
Creatinine, Ser: 0.75
Creatinine, Ser: 0.96
Creatinine, Ser: 0.85
GFR calc Af Amer: >60
GFR calc Af Amer: >60
GFR calc Af Amer: >60
GFR calc non Af Amer: >60
GFR calc non Af Amer: >60
GFR calc non Af Amer: >60
Glucose, Bld: 130 — ABNORMAL HIGH
Glucose, Bld: 131 — ABNORMAL HIGH
Glucose, Bld: 99
Potassium: 3.5
Potassium: 3.9
Potassium: 4
Sodium: 138
Sodium: 138
Sodium: 141

## 2010-10-04 LAB — POCT CARDIAC MARKERS
CKMB, poc: 2.2
CKMB, poc: 6.7
Myoglobin, poc: 228
Myoglobin, poc: 375
Operator id: 234501
Operator id: 234501
Troponin i, poc: 0.36 — ABNORMAL HIGH
Troponin i, poc: <0.05

## 2010-10-04 LAB — CK TOTAL AND CKMB (NOT AT ARMC)
CK, MB: 45.8 — ABNORMAL HIGH
CK, MB: 71 — ABNORMAL HIGH
CK, MB: 75.7 — ABNORMAL HIGH
CK, MB: 98.3 — ABNORMAL HIGH
Relative Index: 17.8 — ABNORMAL HIGH
Relative Index: 19.3 — ABNORMAL HIGH
Relative Index: 19.4 — ABNORMAL HIGH
Relative Index: 20.1 — ABNORMAL HIGH
Total CK: 257 — ABNORMAL HIGH
Total CK: 367 — ABNORMAL HIGH
Total CK: 391 — ABNORMAL HIGH
Total CK: 490 — ABNORMAL HIGH

## 2010-10-04 LAB — CBC
HCT: 35.5 — ABNORMAL LOW
HCT: 43.2
HCT: 33.9 — ABNORMAL LOW
Hemoglobin: 12.2 — ABNORMAL LOW
Hemoglobin: 14.8
Hemoglobin: 12.2 — ABNORMAL LOW
MCHC: 34.3
MCHC: 34.3
MCHC: 35.9
MCV: 92.3
MCV: 92.5
MCV: 91
Platelets: 176
Platelets: 245
Platelets: 188
RBC: 3.85 — ABNORMAL LOW
RBC: 4.67
RBC: 3.72 — ABNORMAL LOW
RDW: 12.5
RDW: 12.9
RDW: 12.8
WBC: 6.9
WBC: 7.8
WBC: 6.8

## 2010-10-04 LAB — TROPONIN I
Troponin I: 11.43
Troponin I: 17.46
Troponin I: 4.26
Troponin I: 9.51

## 2010-10-04 LAB — RAPID URINE DRUG SCREEN, HOSP PERFORMED
Amphetamines: NOT DETECTED
Barbiturates: NOT DETECTED
Benzodiazepines: POSITIVE — AB
Cocaine: NOT DETECTED
Opiates: NOT DETECTED
Tetrahydrocannabinol: NOT DETECTED

## 2010-10-04 LAB — LIPID PANEL
Cholesterol: 170
HDL: 43
LDL Cholesterol: 113 — ABNORMAL HIGH
Total CHOL/HDL Ratio: 4
Triglycerides: 68
VLDL: 14

## 2010-10-04 LAB — PROTIME-INR
INR: 1
Prothrombin Time: 13.4

## 2010-10-04 LAB — B-NATRIURETIC PEPTIDE (CONVERTED LAB): Pro B Natriuretic peptide (BNP): 68

## 2010-10-04 LAB — TSH: TSH: 0.713

## 2010-10-04 LAB — APTT: aPTT: 179 — ABNORMAL HIGH

## 2010-10-11 ENCOUNTER — Telehealth: Payer: Self-pay | Admitting: Cardiology

## 2010-10-11 DIAGNOSIS — Z79899 Other long term (current) drug therapy: Secondary | ICD-10-CM

## 2010-10-11 DIAGNOSIS — E782 Mixed hyperlipidemia: Secondary | ICD-10-CM

## 2010-10-11 DIAGNOSIS — I10 Essential (primary) hypertension: Secondary | ICD-10-CM

## 2010-10-11 DIAGNOSIS — I2510 Atherosclerotic heart disease of native coronary artery without angina pectoris: Secondary | ICD-10-CM

## 2010-10-11 NOTE — Telephone Encounter (Signed)
Pt set up appt for Nov. 14 and wants to know if he will need to fast and get blood work. Please return pt call to discuss further.

## 2010-10-11 NOTE — Telephone Encounter (Signed)
Patient has a yearly office visit on 11/21/10 with Dr. Jens Som. Patient states has blood work done prior visit. An appointment was made for 11/19/10 for fasting lipid, LFT, BMET. Patient aware.

## 2010-10-12 ENCOUNTER — Other Ambulatory Visit: Payer: Self-pay | Admitting: *Deleted

## 2010-10-12 MED ORDER — CLOPIDOGREL BISULFATE 75 MG PO TABS: 75.0000 mg | ORAL_TABLET | Freq: Every day | ORAL | Status: DC

## 2010-10-15 ENCOUNTER — Telehealth: Payer: Self-pay | Admitting: Cardiology

## 2010-10-15 MED ORDER — CLOPIDOGREL BISULFATE 75 MG PO TABS: 75.0000 mg | ORAL_TABLET | Freq: Every day | ORAL | Status: DC

## 2010-10-15 NOTE — Telephone Encounter (Signed)
Pt needs a refill on plavix [facility] [address] he is completely out

## 2010-10-16 ENCOUNTER — Other Ambulatory Visit: Payer: Self-pay | Admitting: *Deleted

## 2010-10-16 MED ORDER — CLOPIDOGREL BISULFATE 75 MG PO TABS: 75.0000 mg | ORAL_TABLET | Freq: Every day | ORAL | Status: DC

## 2010-11-12 ENCOUNTER — Telehealth: Payer: Self-pay | Admitting: Cardiology

## 2010-11-12 ENCOUNTER — Other Ambulatory Visit: Payer: Self-pay | Admitting: Cardiology

## 2010-11-12 MED ORDER — CLOPIDOGREL BISULFATE 75 MG PO TABS: 75.0000 mg | ORAL_TABLET | Freq: Every day | ORAL | Status: DC

## 2010-11-12 NOTE — Telephone Encounter (Signed)
Pt needs refill generic plavix 75mg  pt out needs asap, uses [facility] [address]

## 2010-11-19 ENCOUNTER — Encounter: Payer: Self-pay | Admitting: Cardiology

## 2010-11-19 ENCOUNTER — Encounter: Payer: Self-pay | Admitting: *Deleted

## 2010-11-19 ENCOUNTER — Other Ambulatory Visit (INDEPENDENT_AMBULATORY_CARE_PROVIDER_SITE_OTHER): Payer: BC Managed Care – PPO | Admitting: *Deleted

## 2010-11-19 DIAGNOSIS — Z79899 Other long term (current) drug therapy: Secondary | ICD-10-CM

## 2010-11-19 DIAGNOSIS — I2510 Atherosclerotic heart disease of native coronary artery without angina pectoris: Secondary | ICD-10-CM

## 2010-11-19 DIAGNOSIS — E782 Mixed hyperlipidemia: Secondary | ICD-10-CM

## 2010-11-19 DIAGNOSIS — I10 Essential (primary) hypertension: Secondary | ICD-10-CM

## 2010-11-20 ENCOUNTER — Encounter: Payer: Self-pay | Admitting: *Deleted

## 2010-11-20 ENCOUNTER — Other Ambulatory Visit: Payer: Self-pay

## 2010-11-20 LAB — BASIC METABOLIC PANEL
BUN: 17 mg/dL (ref 6–23)
CO2: 30 mEq/L (ref 19–32)
Calcium: 8.9 mg/dL (ref 8.4–10.5)
Chloride: 108 mEq/L (ref 96–112)
Creatinine, Ser: 0.9 mg/dL (ref 0.4–1.5)
GFR: 97.74 mL/min (ref >60.00)
Glucose, Bld: 98 mg/dL (ref 70–99)
Potassium: 4.4 mEq/L (ref 3.5–5.1)
Sodium: 142 mEq/L (ref 135–145)

## 2010-11-20 LAB — LIPID PANEL
Cholesterol: 112 mg/dL (ref 0–200)
HDL: 47.2 mg/dL (ref >39.00)
LDL Cholesterol: 53 mg/dL (ref 0–99)
Total CHOL/HDL Ratio: 2
Triglycerides: 58 mg/dL (ref 0.0–149.0)
VLDL: 11.6 mg/dL (ref 0.0–40.0)

## 2010-11-20 LAB — HEPATIC FUNCTION PANEL
ALT: 21 U/L (ref 0–53)
AST: 23 U/L (ref 0–37)
Albumin: 3.7 g/dL (ref 3.5–5.2)
Alkaline Phosphatase: 74 U/L (ref 39–117)
Bilirubin, Direct: 0 mg/dL (ref 0.0–0.3)
Total Bilirubin: 0.7 mg/dL (ref 0.3–1.2)
Total Protein: 6.4 g/dL (ref 6.0–8.3)

## 2010-11-21 ENCOUNTER — Encounter: Payer: Self-pay | Admitting: Cardiology

## 2010-11-21 ENCOUNTER — Ambulatory Visit (INDEPENDENT_AMBULATORY_CARE_PROVIDER_SITE_OTHER): Payer: BC Managed Care – PPO | Admitting: Cardiology

## 2010-11-21 DIAGNOSIS — E785 Hyperlipidemia, unspecified: Secondary | ICD-10-CM

## 2010-11-21 DIAGNOSIS — I10 Essential (primary) hypertension: Secondary | ICD-10-CM

## 2010-11-21 DIAGNOSIS — I2510 Atherosclerotic heart disease of native coronary artery without angina pectoris: Secondary | ICD-10-CM

## 2010-11-21 MED ORDER — ATORVASTATIN CALCIUM 80 MG PO TABS: 80.0000 mg | ORAL_TABLET | Freq: Every day | ORAL | Status: DC

## 2010-11-21 NOTE — Assessment & Plan Note (Signed)
Continue aspirin and statin but discontinue Plavix. Schedule stress Myoview for risk stratification.

## 2010-11-21 NOTE — Assessment & Plan Note (Signed)
Continue statin. 

## 2010-11-21 NOTE — Assessment & Plan Note (Signed)
Blood pressure controlled. Continue present medications. 

## 2010-11-21 NOTE — Progress Notes (Signed)
ZOX:WRUEAVWU gentleman with past medical history of coronary artery disease. In June of 2009 the patient had drug-eluting stents to his LAD and right coronary artery. His ejection fraction at that time was 40%. His last echocardiogram was performed on October 13, 2007. His LV function was normal. I last saw him in May of 2011. Since then, he denies any dyspnea, chest pain, palpitations or syncope no pedal edema.   Current Outpatient Prescriptions  Medication Sig Dispense Refill  . aspirin 81 MG tablet Take 81 mg by mouth daily.        [name] atorvastatin (LIPITOR) 80 MG tablet Take 80 mg by mouth daily.        . carvedilol (COREG) 3.125 MG tablet TAKE 1 TABLET TWICE DAILY  180 tablet  2  . clopidogrel (PLAVIX) 75 MG tablet Take 1 tablet (75 mg total) by mouth daily.  30 tablet  12  . lisinopril (PRINIVIL,ZESTRIL) 20 MG tablet TAKE 1 TABLET BY MOUTH EVERY DAY  30 tablet  11  . NITROSTAT 0.4 MG SL tablet 1 TABLET UNDER TONGUE AT ONSET OF CHEST PAIN YOU MAY REPEAT EVERY 5 MINUTES FOR UP TO 3 DOSES.  25 tablet  4     Past Medical History  Diagnosis Date  . HYPERTENSION, BENIGN   . HYPERLIPIDEMIA-MIXED   . CAD, NATIVE VESSEL   . Rosacea     Past Surgical History  Procedure Date  . Cholecystectomy   . Hand surgery     History   Social History  . Marital Status: Married    Spouse Name: N/A    Number of Children: N/A  . Years of Education: N/A   Occupational History  . Not on file.   Social History Main Topics  . Smoking status: Former Games developer  . Smokeless tobacco: Not on file  . Alcohol Use: Not on file  . Drug Use: Not on file  . Sexually Active: Not on file   Other Topics Concern  . Not on file   Social History Narrative  . No narrative on file    ROS: no fevers or chills, productive cough, hemoptysis, dysphasia, odynophagia, melena, hematochezia, dysuria, hematuria, rash, seizure activity, orthopnea, PND, pedal edema, claudication. Remaining systems are negative.  Physical  Exam: Well-developed obese in no acute distress.  Skin is warm and dry.  HEENT is normal.  Neck is supple. No thyromegaly.  Chest is clear to auscultation with normal expansion.  Cardiovascular exam is regular rate and rhythm.  Abdominal exam nontender or distended. No masses palpated. Extremities show no edema. neuro grossly intact  ECG sinus bradycardia at a rate of 56. Incomplete right bundle branch block.

## 2010-11-21 NOTE — Patient Instructions (Signed)
Your physician has requested that you have en exercise stress myoview. For further information please visit [email or web address] Please follow instruction sheet, as given.  Your physician has recommended you make the following change in your medication:  1) Stop Plavix.  Your physician wants you to follow-up in: 1 year with Dr. Jens Som. You will receive a reminder letter in the mail two months in advance. If you don't receive a letter, please call our office to schedule the follow-up appointment.

## 2010-12-05 ENCOUNTER — Ambulatory Visit (HOSPITAL_COMMUNITY): Payer: BC Managed Care – PPO | Attending: Cardiology | Admitting: Radiology

## 2010-12-05 DIAGNOSIS — E669 Obesity, unspecified: Secondary | ICD-10-CM | POA: Insufficient documentation

## 2010-12-05 DIAGNOSIS — I10 Essential (primary) hypertension: Secondary | ICD-10-CM

## 2010-12-05 DIAGNOSIS — E785 Hyperlipidemia, unspecified: Secondary | ICD-10-CM

## 2010-12-05 DIAGNOSIS — I4510 Unspecified right bundle-branch block: Secondary | ICD-10-CM | POA: Insufficient documentation

## 2010-12-05 DIAGNOSIS — I2510 Atherosclerotic heart disease of native coronary artery without angina pectoris: Secondary | ICD-10-CM

## 2010-12-05 DIAGNOSIS — Z87891 Personal history of nicotine dependence: Secondary | ICD-10-CM | POA: Insufficient documentation

## 2010-12-05 DIAGNOSIS — I4949 Other premature depolarization: Secondary | ICD-10-CM

## 2010-12-05 MED ORDER — TECHNETIUM TC 99M TETROFOSMIN IV KIT
33.0000 | PACK | Freq: Once | INTRAVENOUS | Status: AC | PRN
  Administered 2010-12-05: 33 via INTRAVENOUS

## 2010-12-05 MED ORDER — TECHNETIUM TC 99M TETROFOSMIN IV KIT
11.0000 | PACK | Freq: Once | INTRAVENOUS | Status: AC | PRN
  Administered 2010-12-05: 11 via INTRAVENOUS

## 2010-12-05 NOTE — Progress Notes (Signed)
[facility] SITE 3 NUCLEAR MED [address] [place] [address] [phone]  Cardiology Nuclear Med Jeffery [name] is a 60 y.o. male [id number] [birth year]   Nuclear Med Background Indication for Stress Test:  Evaluation for Ischemia and PTCA/Stent Patency  History:  6/09 NSTEMI>PTCA/Stent-LAD/RCA, EF=40%; 10/09 Echo:EF=60% Cardiac Risk Factors: History of Smoking, Hypertension, Lipids, Obesity and RBBB  Symptoms:  No cardiac complaints.   Nuclear Pre-Procedure Caffeine/Decaff Intake:  None NPO After: 6:30pm   Lungs:  Clear. IV 0.9% NS with Angio Cath:  20g  IV Site: R Antecubital x 1, tolerated well IV Started by:  Irean Hong, RN  Chest Size (in):  50 Cup Size: n/a  Height: 5' 9.5" (1.765 m)  Weight:  272 lb (123.378 kg)  BMI:  Body mass index is 39.59 kg/(m^2). Tech Comments:  Held Coreg x 24 hours    Nuclear Med Study 1 or 2 day study: 1 day  Stress Test Type:  Stress  Reading MD: Arvilla Meres, MD  Order Authorizing Provider:  Olga Millers, MD  Resting Radionuclide: Technetium 35m Tetrofosmin  Resting Radionuclide Dose: 11 mCi   Stress Radionuclide:  Technetium 4m Tetrofosmin  Stress Radionuclide Dose: 33 mCi           Stress Protocol Rest HR: 52 Stress HR: 176  Rest BP: Sitting:116/59   Standing:127/68 Stress BP:  <203/?  Exercise Time (min): 8:04 METS: 10.1   Predicted Max HR: 160 bpm % Max HR: 110 bpm Rate Pressure Product: 46962   Dose of Adenosine (mg):  n/a Dose of Lexiscan: n/a mg  Dose of Atropine (mg): n/a Dose of Dobutamine: n/a mcg/kg/min (at max HR)  Stress Test Technologist: Smiley Houseman, CMA-N  Nuclear Technologist:  Domenic Polite, CNMT     Rest Procedure:  Myocardial perfusion imaging was performed at rest 45 minutes following the intravenous administration of Technetium 62m Tetrofosmin.  Rest ECG: IRBBB  Stress Procedure:  The patient exercised for 8:04 on the treadmill utilizing the Bruce protocol.   The patient stopped due to leg fatigue and denied any chest pain.  There were nonspecific ST-T wave changes and occasional PVC's with couplets.  Technetium 69m Tetrofosmin was injected at peak exercise and myocardial perfusion imaging was performed after a brief delay.  Stress ECG: 1 mm ST depression in lateral leads at peak and into recovery.  QPS Raw Data Images:  Normal; no motion artifact; normal heart/lung ratio. Stress Images:  There is decreased uptake in the inferior wall. Rest Images:  There is decreased uptake in the inferior wall. Subtraction (SDS):  There is mild to moderately decreased uptake in the inferior and inferolateral walls which may be slightly worse with stress on some images. Transient Ischemic Dilatation (Normal <1.22):  .66 Lung/Heart Ratio (Normal <0.45):  .37  Quantitative Gated Spect Images QGS EDV:  125 ml QGS ESV:  62 ml QGS cine images:  Mild inferior HK QGS EF: 50%  Impression Exercise Capacity:  Fair exercise capacity. BP Response:  Hypertensive blood pressure response. Clinical Symptoms:  There is dyspnea. ECG Impression:  Significant ST abnormalities consistent with ischemia. 1mm ST depression in lateral leads at peak and into recovery Comparison with Prior Nuclear Study: No previous nuclear study performed  Overall Impression:  Abnormal exercise stress test. The exercise ECG is positive. Perfusion images show decreased uptake in the inferior and infero-lateral walls at rest and stress. Slightly worse with stress on some images suggestive of previous infract with possible very  mild peri-infarct ischemia.    Jeffery [name] BensimhonMD 4:07 PM

## 2011-01-28 ENCOUNTER — Other Ambulatory Visit: Payer: Self-pay | Admitting: Gastroenterology

## 2011-05-06 ENCOUNTER — Telehealth: Payer: Self-pay | Admitting: Cardiology

## 2011-05-06 NOTE — Telephone Encounter (Signed)
New msg Pt wants to talk to you about his history. He is filling out form for bcbs and needs some info. Please call him back

## 2011-05-06 NOTE — Telephone Encounter (Signed)
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 Information given to pt. Pt wanted Dr. Jens Som to know how much he appreciated him as a doctor! Mylo Red RN

## 2011-05-30 ENCOUNTER — Encounter (HOSPITAL_COMMUNITY): Payer: Self-pay | Admitting: General Practice

## 2011-05-30 ENCOUNTER — Inpatient Hospital Stay (HOSPITAL_COMMUNITY)
Admission: AD | Admit: 2011-05-30 | Discharge: 2011-05-31 | DRG: 124 | Disposition: A | Discharge status: Home / Self Care | Payer: BC Managed Care – PPO | Source: Other Acute Inpatient Hospital | Attending: Internal Medicine | Admitting: Internal Medicine

## 2011-05-30 ENCOUNTER — Telehealth: Payer: Self-pay | Admitting: Cardiology

## 2011-05-30 DIAGNOSIS — I200 Unstable angina: Secondary | ICD-10-CM

## 2011-05-30 DIAGNOSIS — I10 Essential (primary) hypertension: Secondary | ICD-10-CM | POA: Insufficient documentation

## 2011-05-30 DIAGNOSIS — E785 Hyperlipidemia, unspecified: Secondary | ICD-10-CM | POA: Insufficient documentation

## 2011-05-30 DIAGNOSIS — I249 Acute ischemic heart disease, unspecified: Secondary | ICD-10-CM

## 2011-05-30 DIAGNOSIS — E782 Mixed hyperlipidemia: Secondary | ICD-10-CM | POA: Diagnosis present

## 2011-05-30 DIAGNOSIS — I2510 Atherosclerotic heart disease of native coronary artery without angina pectoris: Principal | ICD-10-CM | POA: Insufficient documentation

## 2011-05-30 HISTORY — DX: Personal history of other diseases of the respiratory system: Z87.09

## 2011-05-30 HISTORY — DX: Ischemic cardiomyopathy: I25.5

## 2011-05-30 LAB — CARDIAC PANEL(CRET KIN+CKTOT+MB+TROPI)
CK, MB: 2.6 ng/mL (ref 0.3–4.0)
Relative Index: 0.4 (ref 0.0–2.5)
Total CK: 580 U/L — ABNORMAL HIGH (ref 7–232)
Troponin I: <0.3 ng/mL (ref <0.30)

## 2011-05-30 MED ORDER — ONDANSETRON HCL 4 MG/2ML IJ SOLN: 4.0000 mg | Freq: Four times a day (QID) | INTRAMUSCULAR | Status: DC | PRN

## 2011-05-30 MED ORDER — HEPARIN BOLUS VIA INFUSION
4000.0000 [IU] | Freq: Once | INTRAVENOUS | Status: AC
  Administered 2011-05-30: 4000 [IU] via INTRAVENOUS
  Filled 2011-05-30: qty 4000

## 2011-05-30 MED ORDER — LISINOPRIL 20 MG PO TABS
20.0000 mg | ORAL_TABLET | Freq: Every day | ORAL | Status: DC
  Administered 2011-05-31: 20 mg via ORAL
  Filled 2011-05-30: qty 1

## 2011-05-30 MED ORDER — ASPIRIN EC 81 MG PO TBEC
81.0000 mg | DELAYED_RELEASE_TABLET | Freq: Every day | ORAL | Status: DC
  Filled 2011-05-30: qty 1

## 2011-05-30 MED ORDER — ACETAMINOPHEN 325 MG PO TABS
650.0000 mg | ORAL_TABLET | ORAL | Status: DC | PRN
  Administered 2011-05-30 (×2): 650 mg via ORAL
  Filled 2011-05-30 (×2): qty 2

## 2011-05-30 MED ORDER — TRAZODONE HCL 100 MG PO TABS
100.0000 mg | ORAL_TABLET | Freq: Every day | ORAL | Status: DC
  Administered 2011-05-30: 100 mg via ORAL
  Filled 2011-05-30 (×2): qty 1

## 2011-05-30 MED ORDER — ALPRAZOLAM 0.25 MG PO TABS: 0.2500 mg | ORAL_TABLET | Freq: Two times a day (BID) | ORAL | Status: DC | PRN

## 2011-05-30 MED ORDER — SODIUM CHLORIDE 0.9 % IV SOLN: 250.0000 mL | INTRAVENOUS | Status: DC | PRN

## 2011-05-30 MED ORDER — NITROGLYCERIN 0.4 MG SL SUBL: 0.4000 mg | SUBLINGUAL_TABLET | SUBLINGUAL | Status: DC | PRN

## 2011-05-30 MED ORDER — HEPARIN (PORCINE) IN NACL 100-0.45 UNIT/ML-% IJ SOLN
1350.0000 [IU]/h | INTRAMUSCULAR | Status: DC
  Administered 2011-05-30: 1350 [IU]/h via INTRAVENOUS
  Filled 2011-05-30 (×5): qty 250

## 2011-05-30 MED ORDER — OMEGA-3-ACID ETHYL ESTERS 1 G PO CAPS
2.0000 g | ORAL_CAPSULE | Freq: Every day | ORAL | Status: DC
  Administered 2011-05-31: 2 g via ORAL
  Filled 2011-05-30: qty 2

## 2011-05-30 MED ORDER — CARVEDILOL 3.125 MG PO TABS
3.1250 mg | ORAL_TABLET | Freq: Two times a day (BID) | ORAL | Status: DC
  Administered 2011-05-31 (×2): 3.125 mg via ORAL
  Filled 2011-05-30 (×4): qty 1

## 2011-05-30 MED ORDER — SODIUM CHLORIDE 0.9 % IJ SOLN: 3.0000 mL | INTRAMUSCULAR | Status: DC | PRN

## 2011-05-30 MED ORDER — OMEGA-3 FATTY ACIDS 1000 MG PO CAPS: 2.0000 g | ORAL_CAPSULE | Freq: Every day | ORAL | Status: DC

## 2011-05-30 MED ORDER — SODIUM CHLORIDE 0.9 % IJ SOLN
3.0000 mL | Freq: Two times a day (BID) | INTRAMUSCULAR | Status: DC
  Administered 2011-05-31: 3 mL via INTRAVENOUS

## 2011-05-30 MED ORDER — ATORVASTATIN CALCIUM 80 MG PO TABS
80.0000 mg | ORAL_TABLET | Freq: Every day | ORAL | Status: DC
  Administered 2011-05-30: 80 mg via ORAL
  Filled 2011-05-30 (×2): qty 1

## 2011-05-30 NOTE — Telephone Encounter (Signed)
New problem:  Patient calling C/O not feeling right, tightness in chest. May be ingestion. Pt would like to be seen today if possible.

## 2011-05-30 NOTE — Telephone Encounter (Signed)
Patient called because he is having some tightness on his chest started  last night and and again  today. He has some sweating , feels anxious, nervous like feelings. These symptoms are  like when he started having the heart attack before, not as bad, But he does not feel right. He would like to be seen today. He is on the road driving to [place]. Patient has not taken NTG. Dr.Crenshaw recommended for pt. To go to the closes hospital ER to be seen. Patient states he is now in [facility]. He will go to a Hospital ER there.
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
## 2011-05-31 ENCOUNTER — Encounter (HOSPITAL_COMMUNITY): Payer: Self-pay | Admitting: Nurse Practitioner

## 2011-05-31 ENCOUNTER — Encounter (HOSPITAL_COMMUNITY)
Admission: AD | Disposition: A | Discharge status: Home / Self Care | Payer: Self-pay | Source: Other Acute Inpatient Hospital | Attending: Internal Medicine

## 2011-05-31 ENCOUNTER — Ambulatory Visit (HOSPITAL_COMMUNITY): Admit: 2011-05-31 | Payer: Self-pay | Admitting: Cardiovascular Disease

## 2011-05-31 ENCOUNTER — Encounter (HOSPITAL_COMMUNITY): Payer: Self-pay

## 2011-05-31 DIAGNOSIS — I200 Unstable angina: Secondary | ICD-10-CM

## 2011-05-31 DIAGNOSIS — I2510 Atherosclerotic heart disease of native coronary artery without angina pectoris: Secondary | ICD-10-CM

## 2011-05-31 HISTORY — PX: LEFT HEART CATHETERIZATION WITH CORONARY ANGIOGRAM: SHX5451

## 2011-05-31 HISTORY — PX: CARDIAC CATHETERIZATION: SHX172

## 2011-05-31 LAB — PROTIME-INR
INR: 1.04 (ref 0.00–1.49)
Prothrombin Time: 13.8 seconds (ref 11.6–15.2)

## 2011-05-31 LAB — CARDIAC PANEL(CRET KIN+CKTOT+MB+TROPI)
CK, MB: 2.2 ng/mL (ref 0.3–4.0)
CK, MB: 2.1 ng/mL (ref 0.3–4.0)
Relative Index: 0.5 (ref 0.0–2.5)
Relative Index: 0.6 (ref 0.0–2.5)
Total CK: 437 U/L — ABNORMAL HIGH (ref 7–232)
Total CK: 363 U/L — ABNORMAL HIGH (ref 7–232)
Troponin I: <0.3 ng/mL (ref <0.30)
Troponin I: <0.3 ng/mL (ref <0.30)

## 2011-05-31 LAB — LIPID PANEL
Cholesterol: 90 mg/dL (ref 0–200)
HDL: 46 mg/dL (ref >39)
LDL Cholesterol: 37 mg/dL (ref 0–99)
Total CHOL/HDL Ratio: 2 RATIO
Triglycerides: 35 mg/dL (ref <150)
VLDL: 7 mg/dL (ref 0–40)

## 2011-05-31 LAB — CBC
HCT: 40.2 % (ref 39.0–52.0)
Hemoglobin: 13.6 g/dL (ref 13.0–17.0)
MCH: 31.6 pg (ref 26.0–34.0)
MCHC: 33.8 g/dL (ref 30.0–36.0)
MCV: 93.3 fL (ref 78.0–100.0)
Platelets: 163 10*3/uL (ref 150–400)
RBC: 4.31 MIL/uL (ref 4.22–5.81)
RDW: 12.8 % (ref 11.5–15.5)
WBC: 8.4 10*3/uL (ref 4.0–10.5)

## 2011-05-31 LAB — BASIC METABOLIC PANEL
BUN: 18 mg/dL (ref 6–23)
CO2: 25 mEq/L (ref 19–32)
Calcium: 8.8 mg/dL (ref 8.4–10.5)
Chloride: 104 mEq/L (ref 96–112)
Creatinine, Ser: 0.86 mg/dL (ref 0.50–1.35)
GFR calc Af Amer: >90 mL/min (ref >90)
GFR calc non Af Amer: >90 mL/min (ref >90)
Glucose, Bld: 117 mg/dL — ABNORMAL HIGH (ref 70–99)
Potassium: 3.7 mEq/L (ref 3.5–5.1)
Sodium: 139 mEq/L (ref 135–145)

## 2011-05-31 LAB — HEPARIN LEVEL (UNFRACTIONATED)
Heparin Unfractionated: 0.46 IU/mL (ref 0.30–0.70)
Heparin Unfractionated: 0.64 IU/mL (ref 0.30–0.70)

## 2011-05-31 SURGERY — LEFT HEART CATHETERIZATION WITH CORONARY ANGIOGRAM
Anesthesia: LOCAL

## 2011-05-31 MED ORDER — ASPIRIN 81 MG PO CHEW
324.0000 mg | CHEWABLE_TABLET | ORAL | Status: DC
  Filled 2011-05-31: qty 4

## 2011-05-31 MED ORDER — DIAZEPAM 2 MG PO TABS
2.0000 mg | ORAL_TABLET | ORAL | Status: AC
  Administered 2011-05-31: 2 mg via ORAL
  Filled 2011-05-31: qty 1

## 2011-05-31 MED ORDER — FENTANYL CITRATE 0.05 MG/ML IJ SOLN
INTRAMUSCULAR | Status: AC
  Filled 2011-05-31: qty 2

## 2011-05-31 MED ORDER — NITROGLYCERIN 0.2 MG/ML ON CALL CATH LAB
INTRAVENOUS | Status: AC
  Filled 2011-05-31: qty 1

## 2011-05-31 MED ORDER — LIDOCAINE HCL (PF) 1 % IJ SOLN
INTRAMUSCULAR | Status: AC
  Filled 2011-05-31: qty 30

## 2011-05-31 MED ORDER — ACETAMINOPHEN 325 MG PO TABS: 650.0000 mg | ORAL_TABLET | ORAL | Status: DC | PRN

## 2011-05-31 MED ORDER — ONDANSETRON HCL 4 MG/2ML IJ SOLN: 4.0000 mg | Freq: Four times a day (QID) | INTRAMUSCULAR | Status: DC | PRN

## 2011-05-31 MED ORDER — SODIUM CHLORIDE 0.9 % IJ SOLN
3.0000 mL | Freq: Two times a day (BID) | INTRAMUSCULAR | Status: DC
  Administered 2011-05-31: 3 mL via INTRAVENOUS

## 2011-05-31 MED ORDER — HEPARIN (PORCINE) IN NACL 2-0.9 UNIT/ML-% IJ SOLN
INTRAMUSCULAR | Status: AC
  Filled 2011-05-31: qty 2000

## 2011-05-31 MED ORDER — SODIUM CHLORIDE 0.9 % IV SOLN: 250.0000 mL | INTRAVENOUS | Status: DC | PRN

## 2011-05-31 MED ORDER — HEPARIN SODIUM (PORCINE) 5000 UNIT/ML IJ SOLN: 5000.0000 [IU] | Freq: Three times a day (TID) | INTRAMUSCULAR | Status: DC

## 2011-05-31 MED ORDER — SODIUM CHLORIDE 0.9 % IJ SOLN: 3.0000 mL | INTRAMUSCULAR | Status: DC | PRN

## 2011-05-31 MED ORDER — ASPIRIN 81 MG PO CHEW
324.0000 mg | CHEWABLE_TABLET | ORAL | Status: AC
  Administered 2011-05-31: 324 mg via ORAL

## 2011-05-31 MED ORDER — MIDAZOLAM HCL 2 MG/2ML IJ SOLN
INTRAMUSCULAR | Status: AC
  Filled 2011-05-31: qty 2

## 2011-05-31 MED ORDER — SODIUM CHLORIDE 0.9 % IV SOLN: 1.0000 mL/kg/h | INTRAVENOUS | Status: DC

## 2011-05-31 MED ORDER — SODIUM CHLORIDE 0.9 % IV SOLN
INTRAVENOUS | Status: DC
  Administered 2011-05-31: 16:00:00 via INTRAVENOUS

## 2011-05-31 NOTE — Progress Notes (Signed)
ANTICOAGULATION CONSULT NOTE - Follow Up Consult  Pharmacy Consult for heparin Indication: chest pain/ACS  Labs:  Basename 05/31/11 0631 05/31/11 0005 05/31/11 0004 05/30/11 1839  HGB 13.6 -- -- --  HCT 40.2 -- -- --  PLT 163 -- -- --  APTT -- -- -- --  LABPROT -- -- -- --  INR -- -- -- --  HEPARINUNFRC 0.64 0.46 -- --  CREATININE -- 0.86 -- --  CKTOTAL 363* -- 437* 580*  CKMB 2.1 -- 2.2 2.6  TROPONINI <0.30 -- <0.30 <0.30    Assessment/Plan: 61yo male therapeutic on heparin.  Plan cath today. Continue heparin at current rate.  F/U after cath   Woodfin Ganja PharmD  05/31/2011,8:34 AM

## 2011-05-31 NOTE — Interval H&P Note (Signed)
History and Physical Interval Note:  05/31/2011 12:10 PM  Jeffery [name]  has presented today for surgery, with the diagnosis of chest pain  The various methods of treatment have been discussed with the patient and family. After consideration of risks, benefits and other options for treatment, the patient has consented to  Procedure(s) (LRB): LEFT HEART CATHETERIZATION WITH CORONARY ANGIOGRAM (N/A) as a surgical intervention .  The patients' history has been reviewed, patient examined, no change in status, stable for surgery.  I have reviewed the patients' chart and labs.  Questions were answered to the patient's satisfaction.     [name] [employer]

## 2011-05-31 NOTE — Discharge Instructions (Signed)

## 2011-05-31 NOTE — Progress Notes (Signed)
ANTICOAGULATION CONSULT NOTE - Follow Up Consult  Pharmacy Consult for heparin Indication: chest pain/ACS  Labs:  Basename 05/31/11 0005 05/30/11 1839  HGB -- --  HCT -- --  PLT -- --  APTT -- --  LABPROT -- --  INR -- --  HEPARINUNFRC 0.46 --  CREATININE -- --  CKTOTAL -- 580*  CKMB -- 2.6  TROPONINI -- <0.30    Assessment/Plan: 61yo male therapeutic on heparin with initial dosing for CP.  Will continue gtt at current rate and confirm stable with am labs.  Colleen Can PharmD BCPS 05/31/2011,1:05 AM

## 2011-05-31 NOTE — Care Management Note (Unsigned)
    Page 1 of 1   05/31/2011     10:34:44 AM   CARE MANAGEMENT NOTE 05/31/2011  Patient:  Jeffery [name], Jeffery [name]   Account Number:  [id number]  Date Initiated:  05/31/2011  Documentation initiated by:  SIMMONS,Anyssa Sharpless  Subjective/Objective Assessment:   ADMITTED WITH C/P; LIVES AT HOME WITH SON; IPTA;  OBTAINS MED FROM [facility] [address] WITHOUT DIFF.     Action/Plan:   DISCHARGE PLANNING INITIATED.  NO NEEDS IDENTIFIED AT THIS TIME.   Anticipated DC Date:  06/01/2011   Anticipated DC Plan:  HOME/SELF CARE      DC Planning Services  CM consult      Choice offered to / List presented to:             Status of service:  In process, will continue to follow Medicare Important Message given?   (If response is "NO", the following Medicare IM given date fields will be blank) Date Medicare IM given:   Date Additional Medicare IM given:    Discharge Disposition:    Per UR Regulation:  Reviewed for med. necessity/level of care/duration of stay  If discussed at Long Length of Stay Meetings, dates discussed:    Comments:  05/31/11  1034  Jafeth Mustin SIMMONS RN, BSN [phone]

## 2011-05-31 NOTE — Discharge Summary (Signed)
See progress notes and cath report. Jeffery [name]

## 2011-05-31 NOTE — Progress Notes (Signed)
Bedrest complete. Pt received fluids post-op and ready for discharge. D/ced Iv's tolerated well, no bleeding noted. Discharge instructions given to pt and pt verbalized understanding.

## 2011-05-31 NOTE — Discharge Summary (Signed)
Patient ID: Jeffery [name],  MRN: [id number], DOB/AGE: 1950-02-24 61 y.o.  Admit date: 05/30/2011 Discharge date: 05/31/2011  Primary Cardiologist: B. Crenshaw, MD  Discharge Diagnoses Principal Problem:  *Unstable angina Active Problems:  CAD, NATIVE VESSEL  HYPERLIPIDEMIA-MIXED  HYPERTENSION, BENIGN   Allergies No Known Allergies  Procedures  Cardiac Catheterization 05/31/2011  Hemodynamics:  AO 108/60  LV 118/18   Coronary angiography:  Coronary dominance: right  Left mainstem: Luminal irregularities.  Left anterior descending (LAD): Patent proximal LAD stent with 20% instent restenosis.  Left circumflex (LCx): 30-40% proximal OM1, otherwise luminal irregularities.  Right coronary artery (RCA): Patent stent. 30-40% mid RCA stenosis.  Left ventriculography: Left ventricular systolic function is normal, LVEF is estimated at 60-65%, there is no significant mitral regurgitation  Final Conclusions: No obstructive CAD, patent stents _____________  History of Present Illness  61 y/o male with prior h/o CAD s/p prior LAD and RCA stenting who was in his USOH until the night prior to admission when he had an episode of "indigestion" and chest tightness relieved with belching.  On the morning of admission, pt had recurrent chest tightness associated with mild dyspnea and diaphoresis.  He drove himself to the [facility] ER where ECG was non-acute and troponin was normal.  Given his history, he was transferred to [facility] for further evaluation.  He was pain free upon arrival.  Hospital Course  Following admission, pt r/o for MI.  He had no further chest pain.  His story was felt to be concerning for angina and decision was made to pursue diagnostic catheterization.  This was performed this AM and has shown non-obstructive dzs with patent LAD and RCA stents.  His EF is normal.  Post-procedure, he has been ambulating without recurrent symptoms or limitations and he will be discharged home  today in good condition.  Discharge Vitals Blood pressure 117/62, pulse 58, temperature 97.8 F (36.6 C), temperature source Oral, resp. rate 20, height 5\' 10"  (1.778 m), weight 244 lb 14.9 oz (111.1 kg), SpO2 97.00%.  Filed Weights   05/30/11 1621 05/31/11 0500 05/31/11 0609  Weight: 244 lb 0.8 oz (110.7 kg) 244 lb 14.9 oz (111.1 kg) 244 lb 14.9 oz (111.1 kg)   Labs  CBC  Basename 05/31/11 0631  WBC 8.4  NEUTROABS --  HGB 13.6  HCT 40.2  MCV 93.3  PLT 163   Basic Metabolic Panel  Basename 05/31/11 0005  NA 139  K 3.7  CL 104  CO2 25  GLUCOSE 117*  BUN 18  CREATININE 0.86  CALCIUM 8.8  MG --  PHOS --   Cardiac Enzymes  Basename 05/31/11 0631 05/31/11 0004 05/30/11 1839  CKTOTAL 363* 437* 580*  CKMB 2.1 2.2 2.6  CKMBINDEX -- -- --  TROPONINI <0.30 <0.30 <0.30   Fasting Lipid Panel  Basename 05/31/11 0631  CHOL 90  HDL 46  LDLCALC 37  TRIG 35  CHOLHDL 2.0  LDLDIRECT --   Disposition  Pt is being discharged home today in good condition.  Follow-up Plans & Appointments  Follow-up Information    Follow up with Nicolasa Ducking, NP on 06/17/2011. (8:00 AM - Dr. Ludwig Clarks Nurse Practitioner)    Contact information:   [address] [place] [address] [phone]          Discharge Medications  Medication List  As of 05/31/2011  3:02 PM   TAKE these medications         aspirin EC 81 MG tablet  Take 81 mg by mouth daily.      atorvastatin 80 MG tablet   Commonly known as: LIPITOR   Take 80 mg by mouth at bedtime.      carvedilol 3.125 MG tablet   Commonly known as: COREG   Take 3.125 mg by mouth 2 (two) times daily with a meal.      fish oil-omega-3 fatty acids 1000 MG capsule   Take 2 g by mouth daily.      lisinopril 20 MG tablet   Commonly known as: PRINIVIL,ZESTRIL   Take 20 mg by mouth daily.      nitroGLYCERIN 0.4 MG SL tablet   Commonly known as: NITROSTAT   Place 0.4 mg under the tongue  every 5 (five) minutes as needed. For chest pain.      traZODone 50 MG tablet   Commonly known as: DESYREL   Take 100 mg by mouth at bedtime.            Outstanding Labs/Studies  None  Duration of Discharge Encounter   Greater than 30 minutes including physician time.  Signed, Nicolasa Ducking NP 05/31/2011, 3:02 PM

## 2011-05-31 NOTE — H&P (View-Only) (Signed)
@   Subjective:  Denies CP or dyspnea   Objective:  Filed Vitals:   05/30/11 1945 05/30/11 2147 05/31/11 0500 05/31/11 0609  BP: 111/64 100/53  117/62  Pulse: 66 58  74  Temp: 98.4 F (36.9 C)   97.8 F (36.6 C)  TempSrc: Oral   Oral  Resp: 20   20  Height:      Weight:   111.1 kg (244 lb 14.9 oz) 111.1 kg (244 lb 14.9 oz)  SpO2: 97%   97%    Intake/Output from previous day:  Intake/Output Summary (Last 24 hours) at 05/31/11 0718 Last data filed at 05/30/11 2149  Gross per 24 hour  Intake    603 ml  Output      0 ml  Net    603 ml    Physical Exam: Physical exam: Well-developed well-nourished in no acute distress.  Skin is warm and dry.  HEENT is normal.  Neck is supple.  Chest is clear to auscultation with normal expansion.  Cardiovascular exam is regular rate and rhythm. 1/6 SEM Abdominal exam nontender or distended. No masses palpated. Extremities show no edema. neuro grossly intact    Lab Results: Basic Metabolic Panel:  Basename 05/31/11 0005  NA 139  K 3.7  CL 104  CO2 25  GLUCOSE 117*  BUN 18  CREATININE 0.86  CALCIUM 8.8  MG --  PHOS --   CBC:  Basename 05/31/11 0631  WBC 8.4  NEUTROABS --  HGB 13.6  HCT 40.2  MCV 93.3  PLT 163   Cardiac Enzymes:  Basename 05/31/11 0004 05/30/11 1839  CKTOTAL 437* 580*  CKMB 2.2 2.6  CKMBINDEX -- --  TROPONINI <0.30 <0.30     Assessment/Plan:  1) CP - Symptoms concerning although could also be GI related. Enzymes negative. Plan cath today (risks and benefits discussed and patient agrees to proceed). Continue present meds. 2) CAD - Continue ASA and statin. 3) Hyperlipidemia - continue statin.   Phylis Javed 05/31/2011, 7:18 AM    

## 2011-05-31 NOTE — CV Procedure (Signed)
   Cardiac Catheterization Procedure Note  Name: WADELL CRADDOCK MRN: [id number] DOB: [birth year]  Procedure: Left Heart Cath, Selective Coronary Angiography, LV angiography  Indication: Unstable angina, known CAD.    Procedural Details: Allen's test was positive.  The right wrist was prepped, draped, and anesthetized with 1% lidocaine. Using the modified Seldinger technique, a 5 French sheath was introduced into the right radial artery. 3 mg of verapamil was administered through the sheath, weight-based unfractionated heparin was administered intravenously. Standard Judkins catheters were used for selective coronary angiography and left ventriculography. Catheter exchanges were performed over an exchange length guidewire. There were no immediate procedural complications. A TR band was used for radial hemostasis at the completion of the procedure.  The patient was transferred to the post catheterization recovery area for further monitoring.  Procedural Findings: Hemodynamics: AO 108/60 LV 118/18  Coronary angiography: Coronary dominance: right  Left mainstem: Luminal irregularities.   Left anterior descending (LAD): Patent proximal LAD stent with 20% instent restenosis.    Left circumflex (LCx): 30-40% proximal OM1, otherwise luminal irregularities.   Right coronary artery (RCA): Patent stent.  30-40% mid RCA stenosis.   Left ventriculography: Left ventricular systolic function is normal, LVEF is estimated at 60-65%, there is no significant mitral regurgitation   Final Conclusions:  No obstructive CAD, patent stents.    Recommendations: Suspect noncardiac chest pain, perhaps GI.  Send home on previous regimen, followup Crenshaw in 2 wks.  Needs note for work => no lifting > 15 lbs with right arm for 2 wks.   Marca Ancona 05/31/2011, 12:45 PM

## 2011-05-31 NOTE — Progress Notes (Signed)
Nurse did not give Coreg last night due to BP 100/53, and HR in the 40's. Harmon Pier

## 2011-05-31 NOTE — Progress Notes (Signed)
@   Subjective:  Denies CP or dyspnea   Objective:  Filed Vitals:   05/30/11 1945 05/30/11 2147 05/31/11 0500 05/31/11 0609  BP: 111/64 100/53  117/62  Pulse: 66 58  74  Temp: 98.4 F (36.9 C)   97.8 F (36.6 C)  TempSrc: Oral   Oral  Resp: 20   20  Height:      Weight:   111.1 kg (244 lb 14.9 oz) 111.1 kg (244 lb 14.9 oz)  SpO2: 97%   97%    Intake/Output from previous day:  Intake/Output Summary (Last 24 hours) at 05/31/11 0865 Last data filed at 05/30/11 2149  Gross per 24 hour  Intake    603 ml  Output      0 ml  Net    603 ml    Physical Exam: Physical exam: Well-developed well-nourished in no acute distress.  Skin is warm and dry.  HEENT is normal.  Neck is supple.  Chest is clear to auscultation with normal expansion.  Cardiovascular exam is regular rate and rhythm. 1/6 SEM Abdominal exam nontender or distended. No masses palpated. Extremities show no edema. neuro grossly intact    Lab Results: Basic Metabolic Panel:  Basename 05/31/11 0005  NA 139  K 3.7  CL 104  CO2 25  GLUCOSE 117*  BUN 18  CREATININE 0.86  CALCIUM 8.8  MG --  PHOS --   CBC:  Basename 05/31/11 0631  WBC 8.4  NEUTROABS --  HGB 13.6  HCT 40.2  MCV 93.3  PLT 163   Cardiac Enzymes:  Basename 05/31/11 0004 05/30/11 1839  CKTOTAL 437* 580*  CKMB 2.2 2.6  CKMBINDEX -- --  TROPONINI <0.30 <0.30     Assessment/Plan:  1) CP - Symptoms concerning although could also be GI related. Enzymes negative. Plan cath today (risks and benefits discussed and patient agrees to proceed). Continue present meds. 2) CAD - Continue ASA and statin. 3) Hyperlipidemia - continue statin.   Olga Millers 05/31/2011, 7:18 AM

## 2011-06-01 ENCOUNTER — Other Ambulatory Visit: Payer: Self-pay | Admitting: Cardiology

## 2011-06-03 MED FILL — Nicardipine HCl IV Soln 2.5 MG/ML: INTRAVENOUS | Qty: 1 | Status: AC

## 2011-06-17 ENCOUNTER — Encounter: Payer: BC Managed Care – PPO | Admitting: Physician Assistant

## 2011-06-20 ENCOUNTER — Telehealth: Payer: Self-pay | Admitting: Cardiology

## 2011-06-20 NOTE — Telephone Encounter (Signed)
Spoke with pt, number to [place] billing given to pt

## 2011-06-20 NOTE — Telephone Encounter (Signed)
New Problem:    Patient called in because the claim's for his cardiac testing, that was ordered by Dr. Jens Som, was denied and he received a bill form [facility] for the test and he would like to know why his claim was denied.  Please call back.

## 2011-06-24 ENCOUNTER — Encounter: Payer: BC Managed Care – PPO | Admitting: Nurse Practitioner

## 2011-06-25 ENCOUNTER — Other Ambulatory Visit: Payer: Self-pay | Admitting: *Deleted

## 2011-06-25 MED ORDER — CARVEDILOL 3.125 MG PO TABS: 3.1250 mg | ORAL_TABLET | Freq: Two times a day (BID) | ORAL | Status: DC

## 2011-07-01 ENCOUNTER — Encounter: Payer: Self-pay | Admitting: Physician Assistant

## 2011-07-01 ENCOUNTER — Ambulatory Visit (INDEPENDENT_AMBULATORY_CARE_PROVIDER_SITE_OTHER): Payer: BC Managed Care – PPO | Admitting: Physician Assistant

## 2011-07-01 VITALS — BP 136/72 | HR 55 | Ht 70.0 in | Wt 239.0 lb

## 2011-07-01 DIAGNOSIS — I2510 Atherosclerotic heart disease of native coronary artery without angina pectoris: Secondary | ICD-10-CM

## 2011-07-01 DIAGNOSIS — K219 Gastro-esophageal reflux disease without esophagitis: Secondary | ICD-10-CM

## 2011-07-01 DIAGNOSIS — E785 Hyperlipidemia, unspecified: Secondary | ICD-10-CM

## 2011-07-01 DIAGNOSIS — R079 Chest pain, unspecified: Secondary | ICD-10-CM

## 2011-07-01 DIAGNOSIS — I10 Essential (primary) hypertension: Secondary | ICD-10-CM

## 2011-07-01 MED ORDER — FAMOTIDINE 20 MG PO TABS: 20.0000 mg | ORAL_TABLET | Freq: Two times a day (BID) | ORAL | Status: DC
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
## 2011-07-01 NOTE — Patient Instructions (Signed)
Your physician recommends that you schedule a follow-up appointment in: DR. Jens Som IN November  START PEPCID 20 MG 1 TABLET TWICE DAILY

## 2011-08-21 ENCOUNTER — Telehealth: Payer: Self-pay | Admitting: Cardiology

## 2011-08-21 NOTE — Telephone Encounter (Signed)
Please return call to patient regarding medication questions, he can be reached at [phone] or [phone]

## 2011-08-21 NOTE — Telephone Encounter (Signed)
Pt was prescribed by S.Weaver PA  Pepcid for GERD, has not been working for him and tried old med of his wife/ Pantoprazole 40 mg and it works very well, pt requests switch in meds. Also, was told when exercising to get pulse to 120-125 bpm range and is wanting to know if he can go to 130-135 bpm? With diet and exercise pt has lost 50 lbs/ FYI, Please advise.

## 2011-08-22 MED ORDER — PANTOPRAZOLE SODIUM 40 MG PO TBEC: 40.0000 mg | DELAYED_RELEASE_TABLET | Freq: Every day | ORAL | Status: DC

## 2011-08-22 NOTE — Telephone Encounter (Signed)
D/c Pepcid. Start Protonix 40 mg QD. Ok to get HR to max 130. Tereso Newcomer, PA-C  1:18 PM 08/22/2011

## 2011-08-22 NOTE — Telephone Encounter (Signed)
Med ordered, pt informed of Max HR 130 bpm, pt agreed to plan.

## 2011-08-22 NOTE — Addendum Note (Signed)
Addended by: Antony Odea on: 08/22/2011 04:11 PM   Modules accepted: Orders

## 2011-11-13 ENCOUNTER — Other Ambulatory Visit: Payer: Self-pay | Admitting: Cardiology

## 2011-11-29 ENCOUNTER — Encounter: Payer: Self-pay | Admitting: Cardiology

## 2011-11-29 ENCOUNTER — Ambulatory Visit (INDEPENDENT_AMBULATORY_CARE_PROVIDER_SITE_OTHER): Payer: BC Managed Care – PPO | Admitting: Cardiology

## 2011-11-29 VITALS — BP 140/82 | HR 66 | Wt 214.0 lb

## 2011-11-29 DIAGNOSIS — I2510 Atherosclerotic heart disease of native coronary artery without angina pectoris: Secondary | ICD-10-CM

## 2011-11-29 MED ORDER — NITROGLYCERIN 0.4 MG SL SUBL: 0.4000 mg | SUBLINGUAL_TABLET | SUBLINGUAL | Status: DC | PRN
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
## 2011-11-29 NOTE — Assessment & Plan Note (Signed)
Blood pressure controlled. Continue present medications. Check potassium and renal function. 

## 2011-11-29 NOTE — Patient Instructions (Addendum)
Your physician wants you to follow-up in: ONE YEAR WITH DR CRENSHAW You will receive a reminder letter in the mail two months in advance. If you don't receive a letter, please call our office to schedule the follow-up appointment.   Your physician recommends that you return for lab work in: WHEN FASTING 

## 2011-11-29 NOTE — Assessment & Plan Note (Signed)
Continue aspirin and statin. 

## 2011-11-29 NOTE — Assessment & Plan Note (Signed)
Continue statin. Check lipids and liver. 

## 2011-12-04 ENCOUNTER — Other Ambulatory Visit (INDEPENDENT_AMBULATORY_CARE_PROVIDER_SITE_OTHER): Payer: BC Managed Care – PPO

## 2011-12-04 ENCOUNTER — Encounter: Payer: Self-pay | Admitting: *Deleted

## 2011-12-04 DIAGNOSIS — I2510 Atherosclerotic heart disease of native coronary artery without angina pectoris: Secondary | ICD-10-CM

## 2011-12-04 LAB — LIPID PANEL
Cholesterol: 92 mg/dL (ref 0–200)
HDL: 43.2 mg/dL (ref >39.00)
LDL Cholesterol: 39 mg/dL (ref 0–99)
Total CHOL/HDL Ratio: 2
Triglycerides: 50 mg/dL (ref 0.0–149.0)
VLDL: 10 mg/dL (ref 0.0–40.0)

## 2011-12-04 LAB — BASIC METABOLIC PANEL
BUN: 12 mg/dL (ref 6–23)
CO2: 30 mEq/L (ref 19–32)
Calcium: 9.3 mg/dL (ref 8.4–10.5)
Chloride: 104 mEq/L (ref 96–112)
Creatinine, Ser: 1 mg/dL (ref 0.4–1.5)
GFR: 83.63 mL/min (ref >60.00)
Glucose, Bld: 94 mg/dL (ref 70–99)
Potassium: 3.6 mEq/L (ref 3.5–5.1)
Sodium: 140 mEq/L (ref 135–145)

## 2011-12-04 LAB — HEPATIC FUNCTION PANEL
ALT: 23 U/L (ref 0–53)
AST: 23 U/L (ref 0–37)
Albumin: 3.8 g/dL (ref 3.5–5.2)
Alkaline Phosphatase: 52 U/L (ref 39–117)
Bilirubin, Direct: 0.1 mg/dL (ref 0.0–0.3)
Total Bilirubin: 0.9 mg/dL (ref 0.3–1.2)
Total Protein: 6.2 g/dL (ref 6.0–8.3)

## 2011-12-12 ENCOUNTER — Telehealth: Payer: Self-pay | Admitting: Cardiology

## 2011-12-12 NOTE — Telephone Encounter (Signed)
New problem:  Test results.  

## 2011-12-12 NOTE — Telephone Encounter (Signed)
Spoke with pt, aware of lab results. Another copy mailed to pt home.

## 2012-03-10 ENCOUNTER — Telehealth: Payer: Self-pay | Admitting: Cardiology

## 2012-03-10 NOTE — Telephone Encounter (Signed)
Spoke with pt, stents from 2009 mailed to his home address.

## 2012-03-10 NOTE — Telephone Encounter (Signed)
New problem    Patient has a card that showing only having one stent.  Patient stated that he had 2 stents. Can This be update.

## 2012-05-25 ENCOUNTER — Other Ambulatory Visit: Payer: Self-pay | Admitting: Cardiology

## 2012-07-02 ENCOUNTER — Other Ambulatory Visit: Payer: Self-pay | Admitting: Cardiology

## 2012-08-24 ENCOUNTER — Other Ambulatory Visit: Payer: Self-pay | Admitting: Physician Assistant

## 2012-10-30 ENCOUNTER — Other Ambulatory Visit: Payer: Self-pay | Admitting: *Deleted

## 2012-10-30 DIAGNOSIS — I10 Essential (primary) hypertension: Secondary | ICD-10-CM

## 2012-10-30 DIAGNOSIS — I2510 Atherosclerotic heart disease of native coronary artery without angina pectoris: Secondary | ICD-10-CM

## 2012-10-30 DIAGNOSIS — E785 Hyperlipidemia, unspecified: Secondary | ICD-10-CM

## 2012-11-12 ENCOUNTER — Other Ambulatory Visit: Payer: Self-pay | Admitting: Urology

## 2012-11-19 ENCOUNTER — Telehealth: Payer: Self-pay | Admitting: Cardiology

## 2012-11-19 NOTE — Telephone Encounter (Signed)
New problem     Pt was told by Dr.Gr[facility] to call this office about his procedure and needs to speak you about his meds today if possible.

## 2012-11-19 NOTE — Telephone Encounter (Signed)
Spoke with pt, Aware of dr crenshaw's recommendations.  °

## 2012-11-19 NOTE — Telephone Encounter (Signed)
Spoke with pt, he is having a radical prostatectomy 12-09-12 and needs clearance.They want him to hold his aspirin 5 days prior to the procedure. Will forward for dr Jens Som review

## 2012-11-19 NOTE — Telephone Encounter (Signed)
If possible, would continue asa; if absolutely necessary, DC 5 days prior to procedure and resume day after. Olga Millers'

## 2012-11-22 ENCOUNTER — Other Ambulatory Visit: Payer: Self-pay | Admitting: Cardiology

## 2012-12-01 ENCOUNTER — Encounter (HOSPITAL_COMMUNITY): Payer: Self-pay | Admitting: Pharmacy Technician

## 2012-12-02 ENCOUNTER — Encounter (HOSPITAL_COMMUNITY)
Admission: RE | Admit: 2012-12-02 | Discharge: 2012-12-02 | Disposition: A | Discharge status: Home / Self Care | Payer: BC Managed Care – PPO | Source: Ambulatory Visit | Attending: Urology | Admitting: Urology

## 2012-12-02 ENCOUNTER — Ambulatory Visit (HOSPITAL_COMMUNITY)
Admission: RE | Admit: 2012-12-02 | Discharge: 2012-12-02 | Disposition: A | Discharge status: Home / Self Care | Payer: BC Managed Care – PPO | Source: Ambulatory Visit | Attending: Urology | Admitting: Urology

## 2012-12-02 ENCOUNTER — Encounter (HOSPITAL_COMMUNITY): Payer: Self-pay

## 2012-12-02 DIAGNOSIS — C61 Malignant neoplasm of prostate: Secondary | ICD-10-CM | POA: Insufficient documentation

## 2012-12-02 DIAGNOSIS — Z01812 Encounter for preprocedural laboratory examination: Secondary | ICD-10-CM | POA: Insufficient documentation

## 2012-12-02 DIAGNOSIS — Z01818 Encounter for other preprocedural examination: Secondary | ICD-10-CM | POA: Insufficient documentation

## 2012-12-02 DIAGNOSIS — Z01810 Encounter for preprocedural cardiovascular examination: Secondary | ICD-10-CM | POA: Insufficient documentation

## 2012-12-02 DIAGNOSIS — Z0183 Encounter for blood typing: Secondary | ICD-10-CM | POA: Insufficient documentation

## 2012-12-02 HISTORY — DX: Malignant (primary) neoplasm, unspecified: C80.1

## 2012-12-02 HISTORY — DX: Asymptomatic varicose veins of unspecified lower extremity: I83.90

## 2012-12-02 HISTORY — DX: Acute myocardial infarction, unspecified: I21.9

## 2012-12-02 HISTORY — DX: Gastro-esophageal reflux disease without esophagitis: K21.9

## 2012-12-02 LAB — ABO/RH: ABO/RH(D): B POS

## 2012-12-02 LAB — CBC
HCT: 39.5 % (ref 39.0–52.0)
Hemoglobin: 13.4 g/dL (ref 13.0–17.0)
MCH: 32 pg (ref 26.0–34.0)
MCHC: 33.9 g/dL (ref 30.0–36.0)
MCV: 94.3 fL (ref 78.0–100.0)
Platelets: 174 10*3/uL (ref 150–400)
RBC: 4.19 MIL/uL — ABNORMAL LOW (ref 4.22–5.81)
RDW: 12.4 % (ref 11.5–15.5)
WBC: 7.1 10*3/uL (ref 4.0–10.5)

## 2012-12-02 LAB — BASIC METABOLIC PANEL
BUN: 20 mg/dL (ref 6–23)
CO2: 29 mEq/L (ref 19–32)
Calcium: 9.8 mg/dL (ref 8.4–10.5)
Chloride: 103 mEq/L (ref 96–112)
Creatinine, Ser: 0.92 mg/dL (ref 0.50–1.35)
GFR calc Af Amer: >90 mL/min (ref >90)
GFR calc non Af Amer: 89 mL/min — ABNORMAL LOW (ref >90)
Glucose, Bld: 110 mg/dL — ABNORMAL HIGH (ref 70–99)
Potassium: 4.5 mEq/L (ref 3.5–5.1)
Sodium: 139 mEq/L (ref 135–145)

## 2012-12-02 NOTE — Pre-Procedure Instructions (Signed)
EKG AND CXR WERE DONE TODAY - PREOP - AT [facility]. PT HAS CARDIOLOGY OFFICE NOTE IN EPIC FROM DR. CRENSHAW 11/29/11.

## 2012-12-02 NOTE — Patient Instructions (Signed)
YOUR SURGERY IS SCHEDULED AT [facility]  ON:  Wednesday  12/3  REPORT TO  SHORT STAY CENTER AT:  6:30 AM      PHONE # FOR SHORT STAY IS [phone]  FOLLOW YOUR BOWEL PREP INSTRUCTIONS DAY BEFORE YOUR SURGERY - INSTRUCTIONS WERE GIVEN BY DR. Ellin Goodie OFFICE  DO NOT EAT OR DRINK ANYTHING AFTER MIDNIGHT THE NIGHT BEFORE YOUR SURGERY.  YOU MAY BRUSH YOUR TEETH, RINSE OUT YOUR MOUTH--BUT NO WATER, NO FOOD, NO CHEWING GUM, NO MINTS, NO CANDIES, NO CHEWING TOBACCO.  PLEASE TAKE THE FOLLOWING MEDICATIONS THE AM OF YOUR SURGERY WITH A FEW SIPS OF WATER:  CARVEDILOL, PANTOPRAZOLE   DO NOT BRING VALUABLES, MONEY, CREDIT CARDS.  DO NOT WEAR JEWELRY, MAKE-UP, NAIL POLISH AND NO METAL PINS OR CLIPS IN YOUR HAIR. CONTACT LENS, DENTURES / PARTIALS, GLASSES SHOULD NOT BE WORN TO SURGERY AND IN MOST CASES-HEARING AIDS WILL NEED TO BE REMOVED.  BRING YOUR GLASSES CASE, ANY EQUIPMENT NEEDED FOR YOUR CONTACT LENS. FOR PATIENTS ADMITTED TO THE HOSPITAL--CHECK OUT TIME THE DAY OF DISCHARGE IS 11:00 AM.  ALL INPATIENT ROOMS ARE PRIVATE - WITH BATHROOM, TELEPHONE, TELEVISION AND WIFI INTERNET.                                                     PLEASE READ OVER ANY  FACT SHEETS THAT YOU WERE GIVEN: BLOOD TRANSFUSION INFORMATION.  FAILURE TO FOLLOW THESE INSTRUCTIONS MAY RESULT IN THE CANCELLATION OF YOUR SURGERY. PLEASE BE AWARE THAT YOU MAY NEED ADDITIONAL BLOOD DRAWN DAY OF YOUR SURGERY  PATIENT SIGNATURE_________________________________

## 2012-12-08 ENCOUNTER — Encounter (HOSPITAL_COMMUNITY): Payer: Self-pay | Admitting: Anesthesiology

## 2012-12-08 NOTE — Anesthesia Preprocedure Evaluation (Addendum)
Anesthesia Evaluation  Patient identified by MRN, date of birth, ID band Patient awake    Reviewed: Allergy & Precautions, H&P , NPO status , Patient's Chart, lab work & pertinent test results  Airway Mallampati: II TM Distance: >3 FB Neck ROM: Full    Dental no notable dental hx.    Pulmonary neg pulmonary ROS, former smoker,  CXR: Mild chronic interstitial coarsening. breath sounds clear to auscultation  Pulmonary exam normal       Cardiovascular hypertension, Pt. on medications and Pt. on home beta blockers + angina + CAD and + Past MI negative cardio ROS  Rhythm:Regular Rate:Normal  ECHO: 10-13-07: EF normal. Valves normal.  ECG: SB 47, RBBB  Clearance given by Dr. Jens Som. Last visit 11-29-11 reviewed. Cath 2013.   Neuro/Psych negative neurological ROS  negative psych ROS   GI/Hepatic negative GI ROS, Neg liver ROS,   Endo/Other  negative endocrine ROS  Renal/GU negative Renal ROS  negative genitourinary   Musculoskeletal negative musculoskeletal ROS (+)   Abdominal   Peds negative pediatric ROS (+)  Hematology negative hematology ROS (+)   Anesthesia Other Findings   Reproductive/Obstetrics negative OB ROS                          Anesthesia Physical Anesthesia Plan  ASA: III  Anesthesia Plan: General   Post-op Pain Management:    Induction: Intravenous  Airway Management Planned: Oral ETT  Additional Equipment:   Intra-op Plan:   Post-operative Plan: Extubation in OR  Informed Consent: I have reviewed the patients History and Physical, chart, labs and discussed the procedure including the risks, benefits and alternatives for the proposed anesthesia with the patient or authorized representative who has indicated his/her understanding and acceptance.   Dental advisory given  Plan Discussed with: CRNA  Anesthesia Plan Comments:         Anesthesia Quick  Evaluation

## 2012-12-08 NOTE — H&P (Signed)
History of Present Illness    Jeffery [name] had an elevating PSA, which was 6.25 prior to his biopsy on 10/26/2012. Prostatic volume was 32.72 mL. There were no significant ultrasonographic abnormality of the patient's prostate. He had a minimal residual urine volume. 3/12 cores came back as positive for adenocarcinoma as follows:    Left apex lateral, Gleason 3+3, 10% of core  Right apex medial, Gleason 3+4, 40% of core  Right mid lateral, Gleason 3+3, 5% of core  Atypia was found onto the left-sided biopsies, high-grade PIN was present at the right apex.    He has minimal voiding symptoms. He has no significant issues with erectile dysfunction. SHIM 25/25  IPSS 8/1   Past Medical History Problems  1. History of Acute Myocardial Infarction (V12.59) 2. History of esophageal reflux (V12.79)  Surgical History Problems  1. History of Cath Stent Placement 2. History of Neuroplasty Median Nerve At Carpal Tunnel 3. History of Tonsillectomy  Current Meds 1. Aspirin 81 MG Oral Tablet;  Therapy: (Recorded:06Jun2013) to Recorded 2. Atorvastatin Calcium 80 MG Oral Tablet;  Therapy: (Recorded:06Jun2013) to Recorded 3. Carvedilol 3.125 MG Oral Tablet;  Therapy: (Recorded:06Jun2013) to Recorded 4. Fish Oil CAPS;  Therapy: (Recorded:06Jun2013) to Recorded 5. TraZODone HCl - 50 MG Oral Tablet;  Therapy: (Recorded:06Jun2013) to Recorded  Allergies Medication  1. No Known Drug Allergies  Family History Problems  1. Family history of Death In The Family Mother   mother deceased  about 89 2. Family history of Family Health Status Number Of Children   one son, one daughter 46. Family history of Prostate Cancer (V16.42)  Social History Problems  1. Denied: History of Alcohol Use 2. Caffeine Use   2 cups coffee daily- green tea 3. Former smoker (V15.82)   quit in 1983- smoked one haf pack per day 4. Marital History - Widowed 5. Occupation:   local truck driver  Review of  Systems Genitourinary, constitutional, skin, eye, otolaryngeal, hematologic/lymphatic, cardiovascular, pulmonary, endocrine, musculoskeletal, gastrointestinal, neurological and psychiatric system(s) were reviewed and pertinent findings if present are noted.  Genitourinary: urinary frequency and nocturia.    Vitals Vital Signs [Data Includes: Last 1 Day]  Recorded: 12Nov2014 08:07AM  Blood Pressure: 136 / 74 Temperature: 97.5 F Heart Rate: 57  Physical Exam Constitutional: Well nourished and well developed . No acute distress.  ENT:. The ears and nose are normal in appearance.  Neck: The appearance of the neck is normal and no neck mass is present.  Pulmonary: No respiratory distress and normal respiratory rhythm and effort.  Cardiovascular: Heart rate and rhythm are normal . No peripheral edema.  Abdomen: The abdomen is soft and nontender. No masses are palpated. No CVA tenderness. No hernias are palpable. No hepatosplenomegaly noted.  Genitourinary: Examination of the penis demonstrates no discharge, no masses, no lesions and a normal meatus. The scrotum is without lesions. The right epididymis is palpably normal and non-tender. The left epididymis is palpably normal and non-tender. The right testis is non-tender and without masses. The left testis is non-tender and without masses.  Skin: Normal skin turgor, no visible rash and no visible skin lesions.  Neuro/Psych:. Mood and affect are appropriate.    Assessment Assessed  1. Adenocarcinoma of prostate (185)  Adenocarcinoma prostate, stage TIc, Gleason 3+4 in 1 core, Gleason 3+3 in 2 cores. On the nomogram a 75% chance of having organ confined disease, 17% chance of having extracapsular and extension, a 4 and 2-1/2% chance, respectively, of having seminal vesicle and lymph  node involvement. With surgery, progression free probability is our 5 and 10 years, and with radiotherapy progression free probability at 5 years is just at 80%.  He has  a normal prostatic volume, and no significant urinary or erectile symptoms. He is 62 years of age. He would prefer surgical management over radiotherapy.   End of Encounter Meds  Medication Name Instruction  Aspirin 81 MG Oral Tablet   Atorvastatin Calcium 80 MG Oral Tablet   Carvedilol 3.125 MG Oral Tablet   Fish Oil CAPS   TraZODone HCl - 50 MG Oral Tablet    Plan Adenocarcinoma of prostate  1. Follow-up Keep Future Appt Office  Follow-up  Status: Complete  Done: 12Nov2014 Adenocarcinoma of prostate, Elevated prostate specific antigen (PSA)  2. PT/OT Referral Referral  Referral  Status: Hold For - PreCert,Date of Service,Physical  Therapy  Requested for: 20Nov2014  Discussion/Summary  The patient was counseled about the natural history of prostate cancer and the standard treatment options that are available for prostate cancer. It was explained to him how his age and life expectancy, clinical stage, Gleason score, and PSA affect his prognosis, the decision to proceed with additional staging studies, as well as how that information influences recommended treatment strategies. We discussed the roles for active surveillance, radiation therapy, surgical therapy, androgen deprivation, as well as ablative therapy options for the treatment of prostate cancer as appropriate to his individual cancer situation. We discussed the risks and benefits of these options with regard to their impact on cancer control and also in terms of potential adverse events, complications, and impact on quiality of life particularly related to urinary, bowel, and sexual function. The patient was encouraged to ask questions throughout the discussion today and all questions were answered to his stated satisfaction. In addition, the patient was provided with and/or directed to appropriate resources and literature for further education about prostate cancer and treatment options.   We discussed surgical therapy for prostate  cancer including the different available surgical approaches. We discussed, in detail, the risks and expectations of surgery with regard to cancer control, urinary control, and erectile function as well as the expected postoperative recovery process. The risks, potential complications/adverse events of radical prostatectomy as well as alternative options were explained to the patient.   We discussed surgical therapy for prostate cancer including the different available surgical approaches. We discussed, in detail, the risks and expectations of surgery with regard to cancer control, urinary control, and erectile function as well as the expected postoperative recovery process. Additional risks of surgery including but not limited to bleeding, infection, hernia formation, nerve damage, lymphocele formation, bowel/rectal injury potentially necessitating colostomy, damage to the urinary tract resulting in urine leakage, urethral stricture, and the cardiopulmonary risks such as myocardial infarction, stroke, death, venothromboembolism, etc. were explained. The risk of open surgical conversion for robotic/laparoscopic prostatectomy was also discussed.   45 minutes were spent in face to face consultation with patient today.    Amendmenthe would like to proceed with a surgical approach. He does appear to understand the advantages and disadvantages he is currently scheduled for early December. We will plan bilateral nerve spare with bilateral pelvic lymph node dissection.[name]

## 2012-12-09 ENCOUNTER — Encounter (HOSPITAL_COMMUNITY)
Admission: RE | Disposition: A | Discharge status: Home / Self Care | Payer: Self-pay | Source: Ambulatory Visit | Attending: Urology

## 2012-12-09 ENCOUNTER — Ambulatory Visit (HOSPITAL_COMMUNITY)
Admission: RE | Admit: 2012-12-09 | Discharge: 2012-12-10 | DRG: 708 | Disposition: A | Discharge status: Home / Self Care | Payer: BC Managed Care – PPO | Source: Ambulatory Visit | Attending: Urology | Admitting: Urology

## 2012-12-09 ENCOUNTER — Encounter (HOSPITAL_COMMUNITY): Payer: Self-pay | Admitting: *Deleted

## 2012-12-09 ENCOUNTER — Encounter (HOSPITAL_COMMUNITY): Payer: BC Managed Care – PPO | Admitting: Certified Registered Nurse Anesthetist

## 2012-12-09 ENCOUNTER — Ambulatory Visit (HOSPITAL_COMMUNITY): Payer: BC Managed Care – PPO | Admitting: Certified Registered Nurse Anesthetist

## 2012-12-09 DIAGNOSIS — Z79899 Other long term (current) drug therapy: Secondary | ICD-10-CM | POA: Insufficient documentation

## 2012-12-09 DIAGNOSIS — K219 Gastro-esophageal reflux disease without esophagitis: Secondary | ICD-10-CM | POA: Insufficient documentation

## 2012-12-09 DIAGNOSIS — Z7982 Long term (current) use of aspirin: Secondary | ICD-10-CM | POA: Insufficient documentation

## 2012-12-09 DIAGNOSIS — C61 Malignant neoplasm of prostate: Secondary | ICD-10-CM | POA: Insufficient documentation

## 2012-12-09 DIAGNOSIS — I252 Old myocardial infarction: Secondary | ICD-10-CM | POA: Insufficient documentation

## 2012-12-09 HISTORY — PX: LYMPHADENECTOMY: SHX5960

## 2012-12-09 HISTORY — PX: ROBOT ASSISTED LAPAROSCOPIC RADICAL PROSTATECTOMY: SHX5141

## 2012-12-09 LAB — TYPE AND SCREEN
ABO/RH(D): B POS
Antibody Screen: NEGATIVE

## 2012-12-09 LAB — HEMOGLOBIN AND HEMATOCRIT, BLOOD
HCT: 36.6 % — ABNORMAL LOW (ref 39.0–52.0)
Hemoglobin: 12.7 g/dL — ABNORMAL LOW (ref 13.0–17.0)

## 2012-12-09 SURGERY — ROBOTIC ASSISTED LAPAROSCOPIC RADICAL PROSTATECTOMY
Anesthesia: General

## 2012-12-09 MED ORDER — HYDROCODONE-ACETAMINOPHEN 5-325 MG PO TABS
1.0000 | ORAL_TABLET | ORAL | Status: DC | PRN
  Administered 2012-12-09: 2 via ORAL
  Filled 2012-12-09: qty 2

## 2012-12-09 MED ORDER — NITROGLYCERIN 0.4 MG SL SUBL: 0.4000 mg | SUBLINGUAL_TABLET | SUBLINGUAL | Status: DC | PRN

## 2012-12-09 MED ORDER — DEXTROSE-NACL 5-0.45 % IV SOLN
INTRAVENOUS | Status: DC
  Administered 2012-12-09 – 2012-12-10 (×3): via INTRAVENOUS

## 2012-12-09 MED ORDER — PANTOPRAZOLE SODIUM 40 MG PO TBEC
40.0000 mg | DELAYED_RELEASE_TABLET | Freq: Every day | ORAL | Status: DC
  Administered 2012-12-10: 40 mg via ORAL
  Filled 2012-12-09: qty 1

## 2012-12-09 MED ORDER — MIDAZOLAM HCL 2 MG/2ML IJ SOLN
INTRAMUSCULAR | Status: AC
  Filled 2012-12-09: qty 2

## 2012-12-09 MED ORDER — GLYCOPYRROLATE 0.2 MG/ML IJ SOLN
INTRAMUSCULAR | Status: DC | PRN
  Administered 2012-12-09: 0.6 mg via INTRAVENOUS
  Administered 2012-12-09: 0.2 mg via INTRAVENOUS

## 2012-12-09 MED ORDER — LISINOPRIL 20 MG PO TABS
20.0000 mg | ORAL_TABLET | Freq: Every day | ORAL | Status: DC
  Administered 2012-12-10: 20 mg via ORAL
  Filled 2012-12-09: qty 1

## 2012-12-09 MED ORDER — CEFAZOLIN SODIUM-DEXTROSE 2-3 GM-% IV SOLR
INTRAVENOUS | Status: AC
  Filled 2012-12-09: qty 50

## 2012-12-09 MED ORDER — ROCURONIUM BROMIDE 100 MG/10ML IV SOLN
INTRAVENOUS | Status: DC | PRN
  Administered 2012-12-09: 50 mg via INTRAVENOUS
  Administered 2012-12-09: 10 mg via INTRAVENOUS
  Administered 2012-12-09: 5 mg via INTRAVENOUS
  Administered 2012-12-09: 10 mg via INTRAVENOUS

## 2012-12-09 MED ORDER — SODIUM CHLORIDE 0.9 % IV BOLUS (SEPSIS)
500.0000 mL | Freq: Once | INTRAVENOUS | Status: AC
  Administered 2012-12-09: 500 mL via INTRAVENOUS

## 2012-12-09 MED ORDER — NEOSTIGMINE METHYLSULFATE 1 MG/ML IJ SOLN
INTRAMUSCULAR | Status: DC | PRN
  Administered 2012-12-09: 5 mg via INTRAVENOUS

## 2012-12-09 MED ORDER — PHENYLEPHRINE HCL 10 MG/ML IJ SOLN
INTRAMUSCULAR | Status: DC | PRN
  Administered 2012-12-09 (×2): 80 ug via INTRAVENOUS
  Administered 2012-12-09: 40 ug via INTRAVENOUS
  Administered 2012-12-09: 80 ug via INTRAVENOUS
  Administered 2012-12-09: 40 ug via INTRAVENOUS
  Administered 2012-12-09: 80 ug via INTRAVENOUS

## 2012-12-09 MED ORDER — ONDANSETRON HCL 4 MG/2ML IJ SOLN
4.0000 mg | INTRAMUSCULAR | Status: DC | PRN
  Administered 2012-12-09: 4 mg via INTRAVENOUS
  Filled 2012-12-09: qty 2

## 2012-12-09 MED ORDER — ONDANSETRON HCL 4 MG/2ML IJ SOLN
INTRAMUSCULAR | Status: DC | PRN
  Administered 2012-12-09: 4 mg via INTRAVENOUS

## 2012-12-09 MED ORDER — ATORVASTATIN CALCIUM 80 MG PO TABS
80.0000 mg | ORAL_TABLET | Freq: Every day | ORAL | Status: DC
  Administered 2012-12-09: 80 mg via ORAL
  Filled 2012-12-09 (×2): qty 1

## 2012-12-09 MED ORDER — PROPOFOL 10 MG/ML IV BOLUS
INTRAVENOUS | Status: AC
  Filled 2012-12-09: qty 20

## 2012-12-09 MED ORDER — CEFAZOLIN SODIUM-DEXTROSE 2-3 GM-% IV SOLR
2.0000 g | INTRAVENOUS | Status: AC
  Administered 2012-12-09: 2 g via INTRAVENOUS

## 2012-12-09 MED ORDER — NEOSTIGMINE METHYLSULFATE 1 MG/ML IJ SOLN
INTRAMUSCULAR | Status: AC
  Filled 2012-12-09: qty 10

## 2012-12-09 MED ORDER — EPHEDRINE SULFATE 50 MG/ML IJ SOLN
INTRAMUSCULAR | Status: DC | PRN
  Administered 2012-12-09: 10 mg via INTRAVENOUS
  Administered 2012-12-09 (×3): 5 mg via INTRAVENOUS

## 2012-12-09 MED ORDER — BUPIVACAINE-EPINEPHRINE PF 0.25-1:200000 % IJ SOLN
INTRAMUSCULAR | Status: AC
  Filled 2012-12-09: qty 30

## 2012-12-09 MED ORDER — HYDROMORPHONE HCL PF 1 MG/ML IJ SOLN
INTRAMUSCULAR | Status: DC | PRN
  Administered 2012-12-09 (×2): 0.5 mg via INTRAVENOUS

## 2012-12-09 MED ORDER — TRAZODONE HCL 100 MG PO TABS
100.0000 mg | ORAL_TABLET | Freq: Every evening | ORAL | Status: DC | PRN
  Filled 2012-12-09: qty 1

## 2012-12-09 MED ORDER — HYDROMORPHONE HCL PF 1 MG/ML IJ SOLN
INTRAMUSCULAR | Status: AC
  Filled 2012-12-09: qty 1

## 2012-12-09 MED ORDER — EPHEDRINE SULFATE 50 MG/ML IJ SOLN
INTRAMUSCULAR | Status: AC
  Filled 2012-12-09: qty 1

## 2012-12-09 MED ORDER — LACTATED RINGERS IV SOLN
INTRAVENOUS | Status: DC | PRN
  Administered 2012-12-09: 08:00:00 via INTRAVENOUS

## 2012-12-09 MED ORDER — FENTANYL CITRATE 0.05 MG/ML IJ SOLN
INTRAMUSCULAR | Status: AC
  Filled 2012-12-09: qty 5

## 2012-12-09 MED ORDER — ROCURONIUM BROMIDE 100 MG/10ML IV SOLN
INTRAVENOUS | Status: AC
  Filled 2012-12-09: qty 1

## 2012-12-09 MED ORDER — SODIUM CHLORIDE 0.9 % IJ SOLN
INTRAMUSCULAR | Status: AC
  Filled 2012-12-09: qty 10

## 2012-12-09 MED ORDER — LIDOCAINE HCL (CARDIAC) 20 MG/ML IV SOLN
INTRAVENOUS | Status: AC
  Filled 2012-12-09: qty 5

## 2012-12-09 MED ORDER — HYDROCODONE-ACETAMINOPHEN 5-325 MG PO TABS: 1.0000 | ORAL_TABLET | Freq: Four times a day (QID) | ORAL | Status: DC | PRN

## 2012-12-09 MED ORDER — GLYCOPYRROLATE 0.2 MG/ML IJ SOLN
INTRAMUSCULAR | Status: AC
  Filled 2012-12-09: qty 1

## 2012-12-09 MED ORDER — LACTATED RINGERS IR SOLN: Status: DC | PRN

## 2012-12-09 MED ORDER — ONDANSETRON HCL 4 MG/2ML IJ SOLN
INTRAMUSCULAR | Status: AC
  Filled 2012-12-09: qty 2

## 2012-12-09 MED ORDER — CIPROFLOXACIN HCL 500 MG PO TABS: 500.0000 mg | ORAL_TABLET | Freq: Two times a day (BID) | ORAL | Status: DC

## 2012-12-09 MED ORDER — HYDROMORPHONE HCL PF 1 MG/ML IJ SOLN
0.2500 mg | INTRAMUSCULAR | Status: DC | PRN
  Administered 2012-12-09: 0.5 mg via INTRAVENOUS

## 2012-12-09 MED ORDER — SUCCINYLCHOLINE CHLORIDE 20 MG/ML IJ SOLN
INTRAMUSCULAR | Status: AC
  Filled 2012-12-09: qty 1

## 2012-12-09 MED ORDER — SODIUM CHLORIDE 0.9 % IR SOLN
Status: DC | PRN
  Administered 2012-12-09: 1000 mL via INTRAVESICAL

## 2012-12-09 MED ORDER — DEXAMETHASONE SODIUM PHOSPHATE 10 MG/ML IJ SOLN
INTRAMUSCULAR | Status: AC
  Filled 2012-12-09: qty 1

## 2012-12-09 MED ORDER — HEPARIN SODIUM (PORCINE) 1000 UNIT/ML IJ SOLN
INTRAMUSCULAR | Status: AC
  Filled 2012-12-09: qty 1

## 2012-12-09 MED ORDER — LACTATED RINGERS IV SOLN
INTRAVENOUS | Status: DC | PRN
  Administered 2012-12-09: 09:00:00

## 2012-12-09 MED ORDER — PROPOFOL 10 MG/ML IV BOLUS
INTRAVENOUS | Status: DC | PRN
  Administered 2012-12-09: 150 mg via INTRAVENOUS

## 2012-12-09 MED ORDER — ACETAMINOPHEN 325 MG PO TABS: 650.0000 mg | ORAL_TABLET | ORAL | Status: DC | PRN

## 2012-12-09 MED ORDER — SUCCINYLCHOLINE CHLORIDE 20 MG/ML IJ SOLN
INTRAMUSCULAR | Status: DC | PRN
  Administered 2012-12-09: 100 mg via INTRAVENOUS

## 2012-12-09 MED ORDER — FENTANYL CITRATE 0.05 MG/ML IJ SOLN
INTRAMUSCULAR | Status: DC | PRN
  Administered 2012-12-09 (×5): 50 ug via INTRAVENOUS

## 2012-12-09 MED ORDER — BUPIVACAINE-EPINEPHRINE 0.25% -1:200000 IJ SOLN
INTRAMUSCULAR | Status: DC | PRN
  Administered 2012-12-09: 24 mL

## 2012-12-09 MED ORDER — DEXAMETHASONE SODIUM PHOSPHATE 10 MG/ML IJ SOLN
INTRAMUSCULAR | Status: DC | PRN
  Administered 2012-12-09: 10 mg via INTRAVENOUS

## 2012-12-09 MED ORDER — CARVEDILOL 3.125 MG PO TABS
3.1250 mg | ORAL_TABLET | Freq: Two times a day (BID) | ORAL | Status: DC
  Administered 2012-12-09 – 2012-12-10 (×3): 3.125 mg via ORAL
  Filled 2012-12-09 (×4): qty 1

## 2012-12-09 MED ORDER — MIDAZOLAM HCL 5 MG/5ML IJ SOLN
INTRAMUSCULAR | Status: DC | PRN
  Administered 2012-12-09: 2 mg via INTRAVENOUS

## 2012-12-09 MED ORDER — SODIUM CHLORIDE 0.9 % IV BOLUS (SEPSIS)
1000.0000 mL | Freq: Once | INTRAVENOUS | Status: AC
  Administered 2012-12-09: 1000 mL via INTRAVENOUS

## 2012-12-09 MED ORDER — PHENYLEPHRINE 40 MCG/ML (10ML) SYRINGE FOR IV PUSH (FOR BLOOD PRESSURE SUPPORT)
PREFILLED_SYRINGE | INTRAVENOUS | Status: AC
  Filled 2012-12-09: qty 10

## 2012-12-09 MED ORDER — KETOROLAC TROMETHAMINE 15 MG/ML IJ SOLN
15.0000 mg | Freq: Four times a day (QID) | INTRAMUSCULAR | Status: DC
  Administered 2012-12-09 – 2012-12-10 (×4): 15 mg via INTRAVENOUS
  Filled 2012-12-09 (×6): qty 1

## 2012-12-09 MED ORDER — LIDOCAINE HCL (CARDIAC) 20 MG/ML IV SOLN
INTRAVENOUS | Status: DC | PRN
  Administered 2012-12-09: 100 mg via INTRAVENOUS

## 2012-12-09 MED ORDER — HYDROMORPHONE HCL PF 2 MG/ML IJ SOLN
INTRAMUSCULAR | Status: AC
  Filled 2012-12-09: qty 1

## 2012-12-09 MED ORDER — MORPHINE SULFATE 2 MG/ML IJ SOLN
2.0000 mg | INTRAMUSCULAR | Status: DC | PRN
  Administered 2012-12-09: 2 mg via INTRAVENOUS
  Filled 2012-12-09: qty 1

## 2012-12-09 MED ORDER — PROMETHAZINE HCL 25 MG/ML IJ SOLN: 6.2500 mg | INTRAMUSCULAR | Status: DC | PRN

## 2012-12-09 SURGICAL SUPPLY — 47 items
CANISTER SUCTION 2500CC (MISCELLANEOUS) ×3 IMPLANT
CATH 18FR 3 WAY 30 (CATHETERS) ×1
CATH 18FR 3WAY 30CC (CATHETERS) IMPLANT
CATH FOLEY 2WAY SLVR 18FR 30CC (CATHETERS) ×3 IMPLANT
CATH ROBINSON RED A/P 16FR (CATHETERS) ×3 IMPLANT
CATH ROBINSON RED A/P 8FR (CATHETERS) ×3 IMPLANT
CATH TIEMANN FOLEY 18FR 5CC (CATHETERS) ×3 IMPLANT
CHLORAPREP W/TINT 26ML (MISCELLANEOUS) ×3 IMPLANT
CLIP LIGATING HEM O LOK PURPLE (MISCELLANEOUS) IMPLANT
CORD HIGH FREQUENCY UNIPOLAR (ELECTROSURGICAL) ×3 IMPLANT
COVER SURGICAL LIGHT HANDLE (MISCELLANEOUS) ×3 IMPLANT
COVER TIP SHEARS 8 DVNC (MISCELLANEOUS) ×2 IMPLANT
COVER TIP SHEARS 8MM DA VINCI (MISCELLANEOUS) ×1
CUTTER ECHEON FLEX ENDO 45 340 (ENDOMECHANICALS) ×3 IMPLANT
DECANTER SPIKE VIAL GLASS SM (MISCELLANEOUS) ×2 IMPLANT
DRAPE SURG IRRIG POUCH 19X23 (DRAPES) ×3 IMPLANT
DRSG TEGADERM 2-3/8X2-3/4 SM (GAUZE/BANDAGES/DRESSINGS) ×12 IMPLANT
DRSG TEGADERM 4X4.75 (GAUZE/BANDAGES/DRESSINGS) ×6 IMPLANT
DRSG TEGADERM 6X8 (GAUZE/BANDAGES/DRESSINGS) ×9 IMPLANT
ELECT REM PT RETURN 9FT ADLT (ELECTROSURGICAL) ×3
ELECTRODE REM PT RTRN 9FT ADLT (ELECTROSURGICAL) ×2 IMPLANT
GAUZE SPONGE 2X2 8PLY STRL LF (GAUZE/BANDAGES/DRESSINGS) ×2 IMPLANT
GLOVE BIO SURGEON STRL SZ 6.5 (GLOVE) ×3 IMPLANT
GLOVE BIOGEL M STRL SZ7.5 (GLOVE) ×10 IMPLANT
GOWN PREVENTION PLUS LG XLONG (DISPOSABLE) ×2 IMPLANT
GOWN STRL REIN XL XLG (GOWN DISPOSABLE) ×12 IMPLANT
HOLDER FOLEY CATH W/STRAP (MISCELLANEOUS) ×3 IMPLANT
IV LACTATED RINGERS 1000ML (IV SOLUTION) ×3 IMPLANT
KIT ACCESSORY DA VINCI DISP (KITS) ×1
KIT ACCESSORY DVNC DISP (KITS) ×2 IMPLANT
NDL SAFETY ECLIPSE 18X1.5 (NEEDLE) ×2 IMPLANT
NEEDLE HYPO 18GX1.5 SHARP (NEEDLE) ×3
PACK ROBOT UROLOGY CUSTOM (CUSTOM PROCEDURE TRAY) ×3 IMPLANT
RELOAD GREEN ECHELON 45 (STAPLE) ×3 IMPLANT
SEALER TISSUE G2 CVD JAW 45CM (ENDOMECHANICALS) IMPLANT
SET TUBE IRRIG SUCTION NO TIP (IRRIGATION / IRRIGATOR) ×3 IMPLANT
SOLUTION ELECTROLUBE (MISCELLANEOUS) ×3 IMPLANT
SPONGE GAUZE 2X2 STER 10/PKG (GAUZE/BANDAGES/DRESSINGS) ×1
SUT ETHILON 3 0 PS 1 (SUTURE) ×1 IMPLANT
SUT VIC AB 2-0 SH 27 (SUTURE) ×6
SUT VIC AB 2-0 SH 27X BRD (SUTURE) ×2 IMPLANT
SUT VICRYL 0 UR6 27IN ABS (SUTURE) ×3 IMPLANT
SUT VLOC BARB 180 ABS3/0GR12 (SUTURE) ×6
SUTURE VLOC BRB 180 ABS3/0GR12 (SUTURE) ×2 IMPLANT
SYR 27GX1/2 1ML LL SAFETY (SYRINGE) ×3 IMPLANT
TOWEL OR NON WOVEN STRL DISP B (DISPOSABLE) ×3 IMPLANT
WATER STERILE IRR 1500ML POUR (IV SOLUTION) ×6 IMPLANT

## 2012-12-09 NOTE — Transfer of Care (Signed)
Immediate Anesthesia Transfer of Care Note  Patient: Jeffery [name]  Procedure(s) Performed: Procedure(s) with comments: ROBOTIC ASSISTED LAPAROSCOPIC RADICAL PROSTATECTOMY (N/A) - 3.5 HRS [id number] LYMPHADENECTOMY  BILATERAL PELVIC LYMPH NODE DISSECTION (Bilateral)  Patient Location: PACU  Anesthesia Type:General  Level of Consciousness: awake, alert , oriented and patient cooperative  Airway & Oxygen Therapy: Patient Spontanous Breathing and Patient connected to face mask oxygen  Post-op Assessment: Report given to PACU RN, Post -op Vital signs reviewed and stable and Patient moving all extremities X 4  Post vital signs: stable  Complications: No apparent anesthesia complications

## 2012-12-09 NOTE — Anesthesia Postprocedure Evaluation (Signed)
Anesthesia Post Note  Patient: Jeffery [name]  Procedure(s) Performed: Procedure(s) (LRB): ROBOTIC ASSISTED LAPAROSCOPIC RADICAL PROSTATECTOMY (N/A) LYMPHADENECTOMY  BILATERAL PELVIC LYMPH NODE DISSECTION (Bilateral)  Anesthesia type: General  Patient location: PACU  Post pain: Pain level controlled  Post assessment: Post-op Vital signs reviewed  Last Vitals: BP 136/61  Pulse 51  Temp(Src) 36.6 C (Oral)  Resp 11  Ht 5\' 9"  (1.753 m)  Wt 200 lb (90.719 kg)  BMI 29.52 kg/m2  SpO2 100%  Post vital signs: Reviewed  Level of consciousness: sedated  Complications: No apparent anesthesia complications

## 2012-12-09 NOTE — Preoperative (Addendum)
Beta Blockers   Reason not to administer Beta Blockers:Not Applicable Beta Blocker taken this AM 12-09-12 0530

## 2012-12-09 NOTE — Interval H&P Note (Signed)
History and Physical Interval Note:  12/09/2012 8:22 AM  Jeffery [name]  has presented today for surgery, with the diagnosis of PROSTATE CANCER  The various methods of treatment have been discussed with the patient and family. After consideration of risks, benefits and other options for treatment, the patient has consented to  Procedure(s) with comments: ROBOTIC ASSISTED LAPAROSCOPIC RADICAL PROSTATECTOMY (N/A) - 3.5 HRS [id number] LYMPHADENECTOMY  BILATERAL PELVIC LYMPH NODE DISSECTION (Bilateral) as a surgical intervention .  The patient's history has been reviewed, patient examined, no change in status, stable for surgery.  I have reviewed the patient's chart and labs.  Questions were answered to the patient's satisfaction.     Kyeshia Zinn S

## 2012-12-09 NOTE — Progress Notes (Signed)
Patient ID: HENDRYX RICKE, male   DOB: 20-May-1950, 62 y.o.   MRN: [id number] Post-op note  Subjective: The patient is doing well.  Has some mild nausea.  No vomiting.  Has not ambulated yet.  Low UO since surgery.  Did receive bolus in PACU.  Objective: Vital signs in last 24 hours: Temp:  [97.3 F (36.3 C)-98.5 F (36.9 C)] 97.3 F (36.3 C) (12/03 1440) Pulse Rate:  [45-62] 62 (12/03 1440) Resp:  [9-18] 15 (12/03 1440) BP: (117-136)/(53-71) 128/61 mmHg (12/03 1440) SpO2:  [100 %] 100 % (12/03 1440) Weight:  [90.719 kg (200 lb)] 90.719 kg (200 lb) (12/03 1335)  Intake/Output from previous day:   Intake/Output this shift: Total I/O In: 3000 [I.V.:2000; IV Piggyback:1000] Out: 205 [Urine:10; Drains:120; Blood:75]  Physical Exam:  General: Alert and oriented. Abdomen: Soft, Nondistended. Incisions: Clean and dry.  Lab Results:  Recent Labs  12/09/12 1220  HGB 12.7*  HCT 36.6*    Assessment/Plan: POD#0   1) Continue to monitor  2) Monitor UO.  Continue IVF at 125cc/hr.  If UO does not increase over the next 1-2 hours will give an additional bolus.   3) Amb, I.S., DVT prophy, clears, pain control      LOS: 0 days   YARBROUGH,Analisia Kingsford G. 12/09/2012, 3:49 PM

## 2012-12-09 NOTE — Op Note (Signed)
Preoperative diagnosis: Clinical stage T1c Adenocarcinoma prostate  Postoperative diagnosis: Same  Procedure: Robotic-assisted laparoscopic radical retropubic prostatectomy with bilateral pelvic lymph node dissection  Surgeon: Valetta Fuller, MD  Asst.: Pecola Leisure, PA Anesthesia: Gen. Endotracheal  Indications: Patient was diagnosed with clinical stage TIc Adenocarcinoma the prostate. He underwent extensive consultation with regard to treatment options. The patient decided on a surgical approach. He appeared to understand the distinct advantages as well as the disadvantages of this procedure. The patient has performed a mechanical bowel prep. He has had placement of PAS compression boots and has received perioperative antibiotics. The patient's preoperative PSA was 6.2. Ultrasound revealed a 33 g prostate.   Technique and findings:The patient was brought to the operating room and had successful induction of general endotracheal anesthesia.the patient was placed in a low lithotomy position with careful padding of all extremities. He was secured to the operative table and placed in the steep Trendelenburg position. He was prepped and draped in usual manner. A Foley catheter was placed sterilely on the field. Camera port site was chosen 18 cm above the pubic symphysis just to the left of the umbilicus. A standard open Hassan technique was utilized. A 12 mm trocar was placed without difficulty. The camera was then inserted and no abnormalities were noted within the pelvis. The trochars were placed with direct visual guidance. This included 3 8mm robotic trochars and a 12 mm and 5 mm assist ports. Once all the ports were placed the robot was docked. The bladder was filled and the space of Retzius was developed with electrocautery dissection as well as blunt dissection. Superficial fat off the endopelvic fascia and bladder neck was removed with electrocautery scissors. The endopelvic fascia was then incised  bilaterally from base to apex. Levator musculature was swept off the apex of the prostate isolating the dorsal venous complex which was then stapled with the ETS stapling device. The anterior bladder neck was identified with the aid of the Foley balloon. This was then transected down to the Foley catheter with electrocautery scissors. The Foley catheter was then retracted anteriorly. Indigo carmine was given and we appeared to be well away from the ureteral orifices. The posterior bladder neck was then transected and the dissection carried down to the adnexal structures. The seminal vesicles and vas deferens on both sides were then individually dissected free and retracted anteriorly. The posterior plane between the rectum and prostate was then established primarily with blunt dissection.  Attention was then turned towards nerve sparing. The patient was felt to be a candidate for bilateral nerve sparing. Superficial fascia along the anterior lateral aspect of the prostate was incised bilaterally. This tissue was then swept laterally until we were able to establish a groove between the neurovascular tissue and the posterior lateral aspect on the prostate bilaterally. This groove was then extended from the apex back to the base of the prostate. With the prostate retracted anteriorly the vascular pedicles of the prostate were taken with the Enseal device. The Foley catheter was then reinserted and the anterior urethra was transected. The posterior urethra was then transected as were some rectourethralis fibers. The prostate was then removed from the pelvis. The pelvis was then copiously irrigated. Rectal insufflation was performed and there was no evidence of rectal injury.  Attention was then turned towards bilateral pelvic lymph node dissection. The obturator node packets were removed I laterally and the dissection extended towards the bifurcation of the iliac artery. The obturator nerve was identified on both  sides and preserved. Hemalock clips were used for small veins and lymphatic channels. The node packets were sent for permanent analysis.  Attention was then turned towards reconstruction. The bladder neck did not require any reconstruction. The bladder neck and posterior urethra were reapproximated at the 6:00 position utilizing a 2-0 Vicryl suture. The rest of the anastomosis was done with a double-armed 3-0 V-lock suture in a 360 degree manner. Additional indigo carmine was given. A new catheter was placed and bladder irrigation revealed no evidence of leakage. A Blake drain was placed through one of the robotic trochars and positioned in the retropubic space above the anastomosis. This was then secured to the skin with a nylon suture. The prostate was placed in the Endopouch retrieval bag. The 12 mm trocar site was closed with a Vicryl suture with the aid of a suture passer. Our other trochars were taken out with direct visual guidance without evidence of any bleeding. The camera port incision was extended slightly to allow for removal of the specimen and then closed with a running Vicryl suture. All port sites were infiltrated with Marcaine and then closed with surgical clips. The patient was then taken to recovery room having had no obvious complications or problems. Sponge and needle counts were correct.

## 2012-12-10 ENCOUNTER — Encounter (HOSPITAL_COMMUNITY): Payer: Self-pay | Admitting: Urology

## 2012-12-10 LAB — BASIC METABOLIC PANEL
BUN: 17 mg/dL (ref 6–23)
CO2: 25 mEq/L (ref 19–32)
Calcium: 8.4 mg/dL (ref 8.4–10.5)
Chloride: 99 mEq/L (ref 96–112)
Creatinine, Ser: 0.93 mg/dL (ref 0.50–1.35)
GFR calc Af Amer: >90 mL/min (ref >90)
GFR calc non Af Amer: 88 mL/min — ABNORMAL LOW (ref >90)
Glucose, Bld: 158 mg/dL — ABNORMAL HIGH (ref 70–99)
Potassium: 4.3 mEq/L (ref 3.5–5.1)
Sodium: 130 mEq/L — ABNORMAL LOW (ref 135–145)

## 2012-12-10 LAB — CREATININE, FLUID (PLEURAL, PERITONEAL, JP DRAINAGE): Creat, Fluid: 1 mg/dL

## 2012-12-10 LAB — HEMOGLOBIN AND HEMATOCRIT, BLOOD
HCT: 32.7 % — ABNORMAL LOW (ref 39.0–52.0)
Hemoglobin: 11.3 g/dL — ABNORMAL LOW (ref 13.0–17.0)

## 2012-12-10 MED ORDER — BISACODYL 10 MG RE SUPP
10.0000 mg | Freq: Once | RECTAL | Status: AC
  Administered 2012-12-10: 10 mg via RECTAL
  Filled 2012-12-10: qty 1

## 2012-12-10 NOTE — Progress Notes (Signed)
Patient ID: Jeffery [name], male   DOB: 07-Sep-1950, 62 y.o.   MRN: [id number] 1 Day Post-Op Subjective: The patient is doing well.  No nausea or vomiting. Pain is adequately controlled. UO improved  Objective: Vital signs in last 24 hours: Temp:  [97.3 F (36.3 C)-98.7 F (37.1 C)] 98.3 F (36.8 C) (12/04 0640) Pulse Rate:  [45-62] 50 (12/04 0640) Resp:  [9-20] 20 (12/04 0640) BP: (117-141)/(53-65) 118/65 mmHg (12/04 0640) SpO2:  [95 %-100 %] 99 % (12/04 0640) Weight:  [90.719 kg (200 lb)] 90.719 kg (200 lb) (12/03 1335)  Intake/Output from previous day: 12/03 0701 - 12/04 0700 In: 5843.8 [P.O.:480; I.V.:4363.8; IV Piggyback:1000] Out: 2790 [Urine:2410; Drains:305; Blood:75] Intake/Output this shift:    Physical Exam:  General: Alert and oriented. CV: RRR Lungs: Clear bilaterally. GI: Soft, Nondistended. Incisions: Dressings intact. Urine: Clear Extremities: Nontender, no erythema, no edema.  Lab Results:  Recent Labs  12/09/12 1220 12/10/12 0440  HGB 12.7* 11.3*  HCT 36.6* 32.7*      Assessment/Plan: POD# 1 s/p robotic prostatectomy.  1) SL IVF 2) Ambulate, Incentive spirometry 3) Transition to oral pain medication 4) Dulcolax suppository 5) Send JP for Cr 6) Plan for likely discharge later today     LOS: 1 day   YARBROUGH,Cejay Cambre G. 12/10/2012, 7:37 AM

## 2012-12-10 NOTE — Discharge Summary (Signed)
  Date of admission: 12/09/2012  Date of discharge: 12/10/2012  Admission diagnosis: Prostate Cancer  Discharge diagnosis: Prostate Cancer  History and Physical: For full details, please see admission history and physical. Briefly, Jeffery [name] is a 62 y.o. gentleman with localized prostate cancer.  After discussing management/treatment options, he elected to proceed with surgical treatment.  Hospital Course: Jeffery [name] was taken to the operating room on 12/09/2012 and underwent a robotic assisted laparoscopic radical prostatectomy. He tolerated this procedure well and without complications. Postoperatively, he was able to be transferred to a regular hospital room following recovery from anesthesia.  He was able to begin ambulating the night of surgery. He remained hemodynamically stable overnight.  He had excellent urine output with appropriately minimal output from his pelvic drain and his pelvic drain was removed on POD #1.  He was transitioned to oral pain medication, tolerated a clear liquid diet, and had met all discharge criteria and was able to be discharged home later on POD#1.  Laboratory values:  Recent Labs  12/09/12 1220 12/10/12 0440  HGB 12.7* 11.3*  HCT 36.6* 32.7*    Disposition: Home  Discharge instruction: He was instructed to be ambulatory but to refrain from heavy lifting, strenuous activity, or driving. He was instructed on urethral catheter care.  Discharge medications:     Medication List    STOP taking these medications       aspirin EC 81 MG tablet     fish oil-omega-3 fatty acids 1000 MG capsule      TAKE these medications       atorvastatin 80 MG tablet  Commonly known as:  LIPITOR  Take 80 mg by mouth at bedtime.     carvedilol 3.125 MG tablet  Commonly known as:  COREG  Take 3.125 mg by mouth 2 (two) times daily with a meal.     ciprofloxacin 500 MG tablet  Commonly known as:  CIPRO  Take 1 tablet (500 mg total) by mouth 2 (two) times  daily. Start day prior to office visit for foley removal     HYDROcodone-acetaminophen 5-325 MG per tablet  Commonly known as:  NORCO  Take 1-2 tablets by mouth every 6 (six) hours as needed.     lisinopril 20 MG tablet  Commonly known as:  PRINIVIL,ZESTRIL  Take 20 mg by mouth daily with breakfast.     nitroGLYCERIN 0.4 MG SL tablet  Commonly known as:  NITROSTAT  Place 1 tablet (0.4 mg total) under the tongue every 5 (five) minutes as needed. For chest pain.     pantoprazole 40 MG tablet  Commonly known as:  PROTONIX  Take 40 mg by mouth daily.     traZODone 50 MG tablet  Commonly known as:  DESYREL  Take 100 mg by mouth at bedtime as needed.        Followup: He will followup in 1 week for catheter removal and to discuss his surgical pathology results.

## 2012-12-14 ENCOUNTER — Ambulatory Visit: Payer: BC Managed Care – PPO | Admitting: Cardiology

## 2012-12-14 ENCOUNTER — Other Ambulatory Visit: Payer: BC Managed Care – PPO

## 2012-12-19 ENCOUNTER — Other Ambulatory Visit: Payer: Self-pay | Admitting: Cardiology

## 2012-12-24 ENCOUNTER — Encounter: Payer: Self-pay | Admitting: *Deleted

## 2012-12-24 ENCOUNTER — Ambulatory Visit (INDEPENDENT_AMBULATORY_CARE_PROVIDER_SITE_OTHER): Payer: BC Managed Care – PPO | Admitting: Cardiology

## 2012-12-24 ENCOUNTER — Other Ambulatory Visit: Payer: BC Managed Care – PPO

## 2012-12-24 ENCOUNTER — Encounter: Payer: Self-pay | Admitting: Cardiology

## 2012-12-24 VITALS — BP 130/70 | HR 60 | Ht 69.5 in | Wt 205.0 lb

## 2012-12-24 DIAGNOSIS — I10 Essential (primary) hypertension: Secondary | ICD-10-CM

## 2012-12-24 DIAGNOSIS — E785 Hyperlipidemia, unspecified: Secondary | ICD-10-CM

## 2012-12-24 DIAGNOSIS — I2510 Atherosclerotic heart disease of native coronary artery without angina pectoris: Secondary | ICD-10-CM

## 2012-12-24 LAB — LIPID PANEL
Cholesterol: 117 mg/dL (ref 0–200)
HDL: 57.9 mg/dL (ref >39.00)
LDL Cholesterol: 54 mg/dL (ref 0–99)
Total CHOL/HDL Ratio: 2
Triglycerides: 26 mg/dL (ref 0.0–149.0)
VLDL: 5.2 mg/dL (ref 0.0–40.0)

## 2012-12-24 LAB — HEPATIC FUNCTION PANEL
ALT: 21 U/L (ref 0–53)
AST: 22 U/L (ref 0–37)
Albumin: 3.9 g/dL (ref 3.5–5.2)
Alkaline Phosphatase: 51 U/L (ref 39–117)
Bilirubin, Direct: 0.1 mg/dL (ref 0.0–0.3)
Total Bilirubin: 0.7 mg/dL (ref 0.3–1.2)
Total Protein: 6.6 g/dL (ref 6.0–8.3)

## 2012-12-24 LAB — BASIC METABOLIC PANEL
BUN: 19 mg/dL (ref 6–23)
CO2: 31 mEq/L (ref 19–32)
Calcium: 9.4 mg/dL (ref 8.4–10.5)
Chloride: 107 mEq/L (ref 96–112)
Creatinine, Ser: 0.8 mg/dL (ref 0.4–1.5)
GFR: 98.39 mL/min (ref >60.00)
Glucose, Bld: 103 mg/dL — ABNORMAL HIGH (ref 70–99)
Potassium: 4.7 mEq/L (ref 3.5–5.1)
Sodium: 142 mEq/L (ref 135–145)

## 2012-12-24 NOTE — Addendum Note (Signed)
Addended by: Williemae Area on: 12/24/2012 09:03 AM   Modules accepted: Orders

## 2012-12-24 NOTE — Assessment & Plan Note (Signed)
Blood pressure controlled. Continue present medications. Check potassium and renal function. 

## 2012-12-24 NOTE — Patient Instructions (Signed)
Your physician wants you to follow-up in: ONE YEAR WITH DR CRENSHAW You will receive a reminder letter in the mail two months in advance. If you don't receive a letter, please call our office to schedule the follow-up appointment.  
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
## 2012-12-24 NOTE — Assessment & Plan Note (Signed)
Continue statin. Check lipids and liver. 

## 2012-12-24 NOTE — Assessment & Plan Note (Signed)
Continue aspirin and statin. 

## 2013-01-23 ENCOUNTER — Other Ambulatory Visit: Payer: Self-pay | Admitting: Cardiology

## 2013-03-22 ENCOUNTER — Telehealth: Payer: Self-pay | Admitting: Cardiology

## 2013-03-22 DIAGNOSIS — I2510 Atherosclerotic heart disease of native coronary artery without angina pectoris: Secondary | ICD-10-CM

## 2013-03-22 NOTE — Telephone Encounter (Signed)
Spoke with pt, order placed for GXT. Sent to [facility] for scheduling.

## 2013-03-22 NOTE — Telephone Encounter (Signed)
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  Can Dr Stanford Breed order/schedule a stress test on a Monday?

## 2013-03-26 ENCOUNTER — Telehealth (HOSPITAL_COMMUNITY): Payer: Self-pay

## 2013-03-31 ENCOUNTER — Ambulatory Visit (HOSPITAL_COMMUNITY)
Admission: RE | Admit: 2013-03-31 | Discharge: 2013-03-31 | Disposition: A | Discharge status: Home / Self Care | Payer: BC Managed Care – PPO | Source: Ambulatory Visit | Attending: Cardiovascular Disease | Admitting: Cardiovascular Disease

## 2013-03-31 DIAGNOSIS — I2510 Atherosclerotic heart disease of native coronary artery without angina pectoris: Secondary | ICD-10-CM

## 2013-04-01 ENCOUNTER — Telehealth: Payer: Self-pay | Admitting: Cardiology

## 2013-04-01 DIAGNOSIS — R9431 Abnormal electrocardiogram [ECG] [EKG]: Secondary | ICD-10-CM

## 2013-04-01 NOTE — Telephone Encounter (Signed)
Pt advised Dr Stanford Breed had not reviewed GXT done yesterday at [facility]. Pt advised Dr Stanford Breed will review today and we will follow up with patient once it has been reviewed.

## 2013-04-01 NOTE — Telephone Encounter (Signed)
Schedule stress myovue Kirk Ruths

## 2013-04-01 NOTE — Telephone Encounter (Signed)
Called patient and advised that his GXT was abnormal per Dr.Crenshaw and that his MD is ordering a stress myoview test. Patient aware to hold Coreg prior to test and eating and drinking restrictions reviewed. He needs this completed prior to 4/12 because of driving issues. Advised will receive a call back from [facility] to schedule nuc stress test.

## 2013-04-01 NOTE — Telephone Encounter (Signed)
New message    Calling for test results  

## 2013-04-14 ENCOUNTER — Ambulatory Visit (HOSPITAL_COMMUNITY): Payer: BC Managed Care – PPO | Attending: Cardiovascular Disease | Admitting: Radiology

## 2013-04-14 VITALS — BP 140/78 | HR 48 | Ht 69.5 in | Wt 211.0 lb

## 2013-04-14 DIAGNOSIS — R9430 Abnormal result of cardiovascular function study, unspecified: Secondary | ICD-10-CM

## 2013-04-14 DIAGNOSIS — E785 Hyperlipidemia, unspecified: Secondary | ICD-10-CM | POA: Insufficient documentation

## 2013-04-14 DIAGNOSIS — I2510 Atherosclerotic heart disease of native coronary artery without angina pectoris: Secondary | ICD-10-CM

## 2013-04-14 DIAGNOSIS — Z8249 Family history of ischemic heart disease and other diseases of the circulatory system: Secondary | ICD-10-CM | POA: Insufficient documentation

## 2013-04-14 DIAGNOSIS — I4510 Unspecified right bundle-branch block: Secondary | ICD-10-CM | POA: Insufficient documentation

## 2013-04-14 DIAGNOSIS — R9439 Abnormal result of other cardiovascular function study: Secondary | ICD-10-CM | POA: Insufficient documentation

## 2013-04-14 DIAGNOSIS — Z87891 Personal history of nicotine dependence: Secondary | ICD-10-CM | POA: Insufficient documentation

## 2013-04-14 DIAGNOSIS — I252 Old myocardial infarction: Secondary | ICD-10-CM | POA: Insufficient documentation

## 2013-04-14 DIAGNOSIS — I10 Essential (primary) hypertension: Secondary | ICD-10-CM | POA: Insufficient documentation

## 2013-04-14 DIAGNOSIS — R9431 Abnormal electrocardiogram [ECG] [EKG]: Secondary | ICD-10-CM | POA: Insufficient documentation

## 2013-04-14 MED ORDER — TECHNETIUM TC 99M SESTAMIBI GENERIC - CARDIOLITE
30.0000 | Freq: Once | INTRAVENOUS | Status: AC | PRN
  Administered 2013-04-14: 30 via INTRAVENOUS

## 2013-04-14 MED ORDER — TECHNETIUM TC 99M SESTAMIBI GENERIC - CARDIOLITE
10.0000 | Freq: Once | INTRAVENOUS | Status: AC | PRN
  Administered 2013-04-14: 10 via INTRAVENOUS

## 2013-04-14 NOTE — Progress Notes (Signed)
[facility] [address] [place], [place] [address] [phone]    Cardiology Nuclear Med Jeffery [name] is a 63 y.o. male     MRN : [id number]     DOB: [birth year]  Procedure Date: 04/14/2013  Nuclear Med Background Indication for Stress Test:  Evaluation for Ischemia, Stent Patency, Abnormal EKG and Abnormal GXT History:  CAD, MI 2009, Cath 2013, Stent LAD/RCA 2009, Echo 2009 EF 60%, MPI 2012 (scar, mild ischemia), hx ICM Cardiac Risk Factors: Family History - CAD, History of Smoking, Hypertension, Lipids and RBBB  Symptoms:  None indicated   Nuclear Pre-Procedure Caffeine/Decaff Intake:  None NPO After: 8:00pm   Lungs:  clear O2 Sat: 97% on room air. IV 0.9% NS with Angio Cath:  22g  IV Site: R Hand  IV Started by:  Crissie Figures, RN  Chest Size (in):  44 Cup Size: n/a  Height: 5' 9.5" (1.765 m)  Weight:  211 lb (95.709 kg)  BMI:  Body mass index is 30.72 kg/(m^2). Tech Comments:  Coreg held >24 hrs    Nuclear Med Study 1 or 2 day study: 1 day  Stress Test Type:  Stress  Reading MD: N/A  Order Authorizing Provider:  Kirk Ruths, MD  Resting Radionuclide: Technetium 37m Sestamibi  Resting Radionuclide Dose: 11.0 mCi   Stress Radionuclide:  Technetium 78m Sestamibi  Stress Radionuclide Dose: 33.0 mCi           Stress Protocol Rest HR: 48 Stress HR: 148  Rest BP: 140/78 Stress BP: 208/85  Exercise Time (min): 9:00 METS: 10.1           Dose of Adenosine (mg):  n/a Dose of Lexiscan: n/a mg  Dose of Atropine (mg): n/a Dose of Dobutamine: n/a mcg/kg/min (at max HR)  Stress Test Technologist: Glade Lloyd, BS-ES  Nuclear Technologist:  Vedia Pereyra, CNMT     Rest Procedure:  Myocardial perfusion imaging was performed at rest 45 minutes following the intravenous administration of Technetium 25m Sestamibi. Rest ECG: NSR with rSR' c/w RV conduction delay  Stress Procedure:  The patient exercised on the treadmill utilizing the  Bruce Protocol for 9:00 minutes. The patient stopped due to fatigue and denied any chest pain.  Technetium 75m Sestamibi was injected at peak exercise and myocardial perfusion imaging was performed after a brief delay. Stress ECG: No significant change from baseline ECG  QPS Raw Data Images:  There is interference from nuclear activity from structures below the diaphragm. This does not affect the ability to read the study. Stress Images:  There is decreased uptake in the inferior wall. Rest Images:  There is decreased uptake in the inferior wall. Subtraction (SDS):  There is a fixed inferior defect that is most consistent with diaphragmatic attenuation. Transient Ischemic Dilatation (Normal <1.22):  0.78 Lung/Heart Ratio (Normal <0.45):  0.36  Quantitative Gated Spect Images QGS EDV:  163 ml QGS ESV:  82 ml  Impression Exercise Capacity:  Good exercise capacity. BP Response:  Hypertensive blood pressure response. Clinical Symptoms:  No significant symptoms noted. ECG Impression:  No significant ST segment change suggestive of ischemia. Comparison with Prior Nuclear Study: compared to study of 2012 the inferior defect now appears worse on rest than on stress imaging.   Overall Impression:  Low risk nuclear stress test with a fixed defect in the inferolateral wall much worse on resting images in the setting of low normal LVF with normal inferior wall motion.  This is most consistent with diaphragmatic attenuation.  No ischemia is noted.    LV Ejection Fraction: 50%.  LV Wall Motion:  Low normal LVF with no regional wall motion abnormalities  Signed: Fransico Him, MD

## 2013-04-16 ENCOUNTER — Telehealth: Payer: Self-pay | Admitting: Cardiology

## 2013-04-16 ENCOUNTER — Encounter: Payer: BC Managed Care – PPO | Admitting: Physician Assistant

## 2013-04-16 NOTE — Telephone Encounter (Signed)
Spoke with pt and reviewed stress test results with him. Will fax to [facility] per his request. Will also leave copy of stress test at front desk for him to pick up per his request. Stress test faxed to [facility]-[phone]

## 2013-04-16 NOTE — Telephone Encounter (Signed)
New message     Pt needs stress test results today.  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

## 2013-04-19 ENCOUNTER — Telehealth: Payer: Self-pay | Admitting: Cardiology

## 2013-04-19 NOTE — Telephone Encounter (Signed)
New message     Pt needs stress test results with OK to work for DOT written on it faxed to [facility]--[phone] attn [place].  Urgent care lost the stress test faxed last week.  Pt needs this fax ASAP because his DOT card expires tomorrow.  Please call pt when results has been faxed.

## 2013-04-19 NOTE — Telephone Encounter (Signed)
Spoke with tiffany, she already has all the information they need. Pt made aware

## 2013-05-21 ENCOUNTER — Other Ambulatory Visit: Payer: Self-pay | Admitting: *Deleted

## 2013-05-21 MED ORDER — LISINOPRIL 20 MG PO TABS: 20.0000 mg | ORAL_TABLET | Freq: Every day | ORAL | Status: DC

## 2013-07-05 ENCOUNTER — Ambulatory Visit: Payer: Self-pay

## 2013-07-05 ENCOUNTER — Encounter: Payer: Self-pay | Admitting: Podiatry

## 2013-07-05 ENCOUNTER — Ambulatory Visit: Payer: BC Managed Care – PPO

## 2013-07-05 ENCOUNTER — Ambulatory Visit (INDEPENDENT_AMBULATORY_CARE_PROVIDER_SITE_OTHER): Payer: BC Managed Care – PPO | Admitting: Podiatry

## 2013-07-05 VITALS — BP 135/78 | HR 84 | Resp 16

## 2013-07-05 DIAGNOSIS — M7750 Other enthesopathy of unspecified foot: Secondary | ICD-10-CM

## 2013-07-05 DIAGNOSIS — R52 Pain, unspecified: Secondary | ICD-10-CM

## 2013-07-05 NOTE — Progress Notes (Signed)
Subjective:     Patient ID: Jeffery [name], male   DOB: [birth year], 63 y.o.   MRN: [id number]  HPI patient presents stating that I'm having pain in the bottom of his right foot and I was just concerned about my digits on both feet   Review of Systems     Objective:   Physical Exam Neurovascular status intact and did have prostate cancer that was treated December of last year. Patient's found to have inflammation underneath the fifth metatarsal base of both feet with fluid buildup and is painful when pressed    Assessment:     Inflammatory capsulitis with tendinitis-like symptomatology bilateral    Plan:     H&P and x-rays reviewed. Injected the right plantar fifth metatarsal base 3 mg Kenalog 5 mg like Marcaine mixture and instructed on physical therapy and reappoint for possible new orthotics depending on response

## 2013-07-05 NOTE — Progress Notes (Signed)
   Subjective:    Patient ID: Jeffery [name] "JIMMY" L Beirne, male    DOB: 01/19/1950, 63 y.o.   MRN: [id number]  HPI  left foot swollen and slightly painful, has gotten worse over past 2 months, feel more pressure when walking. Callus on bottom of right foot and 2nd toe is malformed  Review of Systems     Objective:   Physical Exam        Assessment & Plan:

## 2013-07-13 ENCOUNTER — Other Ambulatory Visit: Payer: Self-pay | Admitting: *Deleted

## 2013-07-13 MED ORDER — ATORVASTATIN CALCIUM 80 MG PO TABS: 80.0000 mg | ORAL_TABLET | Freq: Every day | ORAL | Status: DC

## 2013-07-13 MED ORDER — CARVEDILOL 3.125 MG PO TABS: 3.1250 mg | ORAL_TABLET | Freq: Two times a day (BID) | ORAL | Status: DC

## 2013-07-14 ENCOUNTER — Other Ambulatory Visit: Payer: Self-pay

## 2013-07-14 MED ORDER — LISINOPRIL 20 MG PO TABS: 20.0000 mg | ORAL_TABLET | Freq: Every day | ORAL | Status: DC

## 2013-07-22 NOTE — Telephone Encounter (Signed)
Encounter complete. 

## 2013-09-06 ENCOUNTER — Other Ambulatory Visit: Payer: Self-pay | Admitting: *Deleted

## 2013-09-06 DIAGNOSIS — E785 Hyperlipidemia, unspecified: Secondary | ICD-10-CM

## 2013-12-06 ENCOUNTER — Ambulatory Visit: Payer: BC Managed Care – PPO | Admitting: Podiatry

## 2013-12-10 ENCOUNTER — Other Ambulatory Visit: Payer: Self-pay | Admitting: *Deleted

## 2013-12-10 MED ORDER — CARVEDILOL 3.125 MG PO TABS: 3.1250 mg | ORAL_TABLET | Freq: Two times a day (BID) | ORAL | Status: DC

## 2013-12-13 ENCOUNTER — Other Ambulatory Visit: Payer: BC Managed Care – PPO

## 2013-12-14 ENCOUNTER — Other Ambulatory Visit (INDEPENDENT_AMBULATORY_CARE_PROVIDER_SITE_OTHER): Payer: BC Managed Care – PPO | Admitting: *Deleted

## 2013-12-14 DIAGNOSIS — E785 Hyperlipidemia, unspecified: Secondary | ICD-10-CM

## 2013-12-14 DIAGNOSIS — I200 Unstable angina: Secondary | ICD-10-CM

## 2013-12-14 DIAGNOSIS — I10 Essential (primary) hypertension: Secondary | ICD-10-CM

## 2013-12-14 LAB — LIPID PANEL
Cholesterol: 127 mg/dL (ref 0–200)
HDL: 57.5 mg/dL
LDL Cholesterol: 62 mg/dL (ref 0–99)
NonHDL: 69.5
Total CHOL/HDL Ratio: 2
Triglycerides: 36 mg/dL (ref 0.0–149.0)
VLDL: 7.2 mg/dL (ref 0.0–40.0)

## 2013-12-14 LAB — HEPATIC FUNCTION PANEL
ALT: 26 U/L (ref 0–53)
AST: 27 U/L (ref 0–37)
Albumin: 3.8 g/dL (ref 3.5–5.2)
Alkaline Phosphatase: 52 U/L (ref 39–117)
Bilirubin, Direct: 0.1 mg/dL (ref 0.0–0.3)
Total Bilirubin: 0.9 mg/dL (ref 0.2–1.2)
Total Protein: 6.3 g/dL (ref 6.0–8.3)

## 2013-12-14 LAB — BASIC METABOLIC PANEL WITH GFR
BUN: 18 mg/dL (ref 6–23)
CO2: 30 meq/L (ref 19–32)
Calcium: 9.1 mg/dL (ref 8.4–10.5)
Chloride: 105 meq/L (ref 96–112)
Creatinine, Ser: 0.9 mg/dL (ref 0.4–1.5)
GFR: 95.45 mL/min
Glucose, Bld: 104 mg/dL — ABNORMAL HIGH (ref 70–99)
Potassium: 4.1 meq/L (ref 3.5–5.1)
Sodium: 140 meq/L (ref 135–145)

## 2013-12-14 LAB — CBC WITH DIFFERENTIAL/PLATELET
Basophils Absolute: 0 10*3/uL (ref 0.0–0.1)
Basophils Relative: 0.5 % (ref 0.0–3.0)
Eosinophils Absolute: 0.5 10*3/uL (ref 0.0–0.7)
Eosinophils Relative: 8.9 % — ABNORMAL HIGH (ref 0.0–5.0)
HCT: 41.5 % (ref 39.0–52.0)
Hemoglobin: 13.8 g/dL (ref 13.0–17.0)
Lymphocytes Relative: 19.9 % (ref 12.0–46.0)
Lymphs Abs: 1.2 10*3/uL (ref 0.7–4.0)
MCHC: 33.2 g/dL (ref 30.0–36.0)
MCV: 94.8 fl (ref 78.0–100.0)
Monocytes Absolute: 0.5 10*3/uL (ref 0.1–1.0)
Monocytes Relative: 8.9 % (ref 3.0–12.0)
Neutro Abs: 3.7 10*3/uL (ref 1.4–7.7)
Neutrophils Relative %: 61.8 % (ref 43.0–77.0)
Platelets: 199 10*3/uL (ref 150.0–400.0)
RBC: 4.38 Mil/uL (ref 4.22–5.81)
RDW: 12.9 % (ref 11.5–15.5)
WBC: 6 10*3/uL (ref 4.0–10.5)
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
## 2013-12-16 ENCOUNTER — Encounter (HOSPITAL_COMMUNITY): Payer: Self-pay | Admitting: Cardiology

## 2013-12-17 ENCOUNTER — Other Ambulatory Visit: Payer: Self-pay

## 2013-12-17 MED ORDER — ATORVASTATIN CALCIUM 80 MG PO TABS: 80.0000 mg | ORAL_TABLET | Freq: Every day | ORAL | Status: DC

## 2013-12-20 ENCOUNTER — Encounter: Payer: Self-pay | Admitting: Cardiology

## 2013-12-20 ENCOUNTER — Ambulatory Visit (INDEPENDENT_AMBULATORY_CARE_PROVIDER_SITE_OTHER): Payer: BC Managed Care – PPO | Admitting: Cardiology

## 2013-12-20 VITALS — BP 152/92 | HR 67 | Ht 69.5 in | Wt 237.0 lb

## 2013-12-20 DIAGNOSIS — I10 Essential (primary) hypertension: Secondary | ICD-10-CM

## 2013-12-20 DIAGNOSIS — I2510 Atherosclerotic heart disease of native coronary artery without angina pectoris: Secondary | ICD-10-CM

## 2013-12-20 MED ORDER — LISINOPRIL 40 MG PO TABS: 40.0000 mg | ORAL_TABLET | Freq: Every day | ORAL | Status: DC

## 2013-12-20 NOTE — Assessment & Plan Note (Signed)
Continue statin. 

## 2013-12-20 NOTE — Patient Instructions (Addendum)
Your physician wants you to follow-up in: [place] will receive a reminder letter in the mail two months in advance. If you don't receive a letter, please call our office to schedule the follow-up appointment.   INCREASE LISINOPRIL TO 40 MG ONCE DAILY= 2 OF THE 20 MG TABLETS ONCE DAILY  Your physician recommends that you HAVE LAB WORK ONE WEEK

## 2013-12-20 NOTE — Assessment & Plan Note (Signed)
Bp elevated; change lisinopril to 40 mg daily and follow K and renal function.

## 2013-12-20 NOTE — Assessment & Plan Note (Signed)
Continue aspirin and statin. 

## 2013-12-27 ENCOUNTER — Other Ambulatory Visit (INDEPENDENT_AMBULATORY_CARE_PROVIDER_SITE_OTHER): Payer: BC Managed Care – PPO | Admitting: *Deleted

## 2013-12-27 DIAGNOSIS — I10 Essential (primary) hypertension: Secondary | ICD-10-CM

## 2013-12-27 DIAGNOSIS — I2510 Atherosclerotic heart disease of native coronary artery without angina pectoris: Secondary | ICD-10-CM

## 2013-12-27 LAB — BASIC METABOLIC PANEL
BUN: 18 mg/dL (ref 6–23)
CO2: 29 mEq/L (ref 19–32)
Calcium: 8.9 mg/dL (ref 8.4–10.5)
Chloride: 106 mEq/L (ref 96–112)
Creat: 0.89 mg/dL (ref 0.50–1.35)
Glucose, Bld: 101 mg/dL — ABNORMAL HIGH (ref 70–99)
Potassium: 4 mEq/L (ref 3.5–5.3)
Sodium: 139 mEq/L (ref 135–145)

## 2014-01-05 ENCOUNTER — Encounter: Payer: Self-pay | Admitting: Podiatry

## 2014-01-05 ENCOUNTER — Ambulatory Visit (INDEPENDENT_AMBULATORY_CARE_PROVIDER_SITE_OTHER): Payer: BC Managed Care – PPO | Admitting: Podiatry

## 2014-01-05 ENCOUNTER — Other Ambulatory Visit: Payer: Self-pay | Admitting: Cardiology

## 2014-01-05 VITALS — BP 166/81 | HR 70 | Resp 16

## 2014-01-05 DIAGNOSIS — M205X2 Other deformities of toe(s) (acquired), left foot: Secondary | ICD-10-CM

## 2014-01-05 DIAGNOSIS — M779 Enthesopathy, unspecified: Secondary | ICD-10-CM

## 2014-01-05 DIAGNOSIS — L600 Ingrowing nail: Secondary | ICD-10-CM

## 2014-01-05 MED ORDER — TRIAMCINOLONE ACETONIDE 10 MG/ML IJ SUSP
10.0000 mg | Freq: Once | INTRAMUSCULAR | Status: AC
  Administered 2014-01-05: 10 mg

## 2014-01-05 MED ORDER — PANTOPRAZOLE SODIUM 40 MG PO TBEC: 40.0000 mg | DELAYED_RELEASE_TABLET | Freq: Every day | ORAL | Status: DC

## 2014-01-05 NOTE — Progress Notes (Signed)
Subjective:     Patient ID: Jeffery [name], male   DOB: [birth year], 63 y.o.   MRN: [id number]  HPI patient presents stating I'm getting a lot of pain in my feet in general I'm getting some discoloration second nail right I haven't ingrown toenail of my big toenail right and I'm having pain in the big toe joint left foot   Review of Systems     Objective:   Physical Exam Neurovascular status intact with muscle strength adequate and range of motion subtalar midtarsal joint within normal limits. Patient's noted to have an incurvated lateral border right hallux and has discomfort with inflammation and fluid around the first MPJ left with pain when pressed. Patient has nail disease second right and also has low grade inflammation across the metatarsophalangeal joints of both feet    Assessment:     H&P and x-rays reviewed and conditions discussed. Today I discussed correction of ingrown toenail and explained the risk and patient wants surgery and I infiltrated 60 mg Xylocaine Marcaine mixture remove the lateral border exposed the root and apply chemical phenol 3 applications 30 seconds followed by alcohol lavaged and sterile dressing. I did scanned for custom orthotics to reduce stress against both feet and also injected the left first MPJ 3 mg Kenalog 5 mg Xylocaine and advised that someday this may require open surgery    Plan:     Plan is reviewed above

## 2014-01-05 NOTE — Patient Instructions (Signed)

## 2014-02-07 ENCOUNTER — Telehealth: Payer: Self-pay | Admitting: *Deleted

## 2014-02-07 NOTE — Telephone Encounter (Signed)
PATIENT CAME IN TO PICK UP HIS 2ND PAIR OF ORTHOTICS.  REMINDED PATIENT OF BREAK IN INSTRUCTIONS CALL WITH ANY QUESTIONS OR CONCERNS.

## 2014-02-08 ENCOUNTER — Other Ambulatory Visit: Payer: Self-pay | Admitting: *Deleted

## 2014-02-08 MED ORDER — CARVEDILOL 3.125 MG PO TABS: 3.1250 mg | ORAL_TABLET | Freq: Two times a day (BID) | ORAL | Status: DC

## 2014-04-11 ENCOUNTER — Encounter: Payer: Self-pay | Admitting: Podiatry

## 2014-04-11 ENCOUNTER — Ambulatory Visit (INDEPENDENT_AMBULATORY_CARE_PROVIDER_SITE_OTHER): Payer: BLUE CROSS/BLUE SHIELD

## 2014-04-11 ENCOUNTER — Ambulatory Visit: Payer: Self-pay

## 2014-04-11 ENCOUNTER — Ambulatory Visit (INDEPENDENT_AMBULATORY_CARE_PROVIDER_SITE_OTHER): Payer: BLUE CROSS/BLUE SHIELD | Admitting: Podiatry

## 2014-04-11 VITALS — BP 87/45 | HR 40 | Resp 10

## 2014-04-11 DIAGNOSIS — M79673 Pain in unspecified foot: Secondary | ICD-10-CM | POA: Diagnosis not present

## 2014-04-11 DIAGNOSIS — M779 Enthesopathy, unspecified: Secondary | ICD-10-CM

## 2014-04-11 MED ORDER — TRIAMCINOLONE ACETONIDE 10 MG/ML IJ SUSP
10.0000 mg | Freq: Once | INTRAMUSCULAR | Status: AC
  Administered 2014-04-11: 10 mg

## 2014-04-13 NOTE — Progress Notes (Signed)
Subjective:     Patient ID: Jeffery [name], male   DOB: 08-18-1950, 64 y.o.   MRN: [id number]  HPI patient presents with pain in the left first MPJ which is started to bother him again and also complains of the right foot of a small bump underneath that he wanted checked   Review of Systems     Objective:   Physical Exam Neurovascular status intact muscle strength adequate with inflammation around the first MPJ left lateral side and on the plantar aspect of the right foot there is some irritated tissue but minimal pain    Assessment:     Hallux limitus rigidus deformity left with mild discomfort of the plantar right secondary to weightbearing    Plan:     Reviewed inflammatory capsulitis and hallux limitus condition and did inject the lateral side of the left first MPJ 3 mg Kenalog 5 mg Xylocaine and advised that 1 day this may require surgical intervention

## 2014-04-15 ENCOUNTER — Other Ambulatory Visit: Payer: Self-pay

## 2014-04-15 DIAGNOSIS — I2510 Atherosclerotic heart disease of native coronary artery without angina pectoris: Secondary | ICD-10-CM

## 2014-04-15 MED ORDER — NITROGLYCERIN 0.4 MG SL SUBL: 0.4000 mg | SUBLINGUAL_TABLET | SUBLINGUAL | Status: DC | PRN

## 2014-04-22 ENCOUNTER — Telehealth: Payer: Self-pay | Admitting: Cardiology

## 2014-04-22 DIAGNOSIS — I2510 Atherosclerotic heart disease of native coronary artery without angina pectoris: Secondary | ICD-10-CM

## 2014-04-22 MED ORDER — NITROGLYCERIN 0.4 MG SL SUBL: 0.4000 mg | SUBLINGUAL_TABLET | SUBLINGUAL | Status: DC | PRN

## 2014-04-22 NOTE — Telephone Encounter (Signed)
Spoke with pt, refill for NTG.

## 2014-04-22 NOTE — Telephone Encounter (Signed)
Pt called in stating that he would like to speak with Hilda Blades about his Nitrostat before it gets refilled. Please call  thanks

## 2014-05-18 ENCOUNTER — Other Ambulatory Visit: Payer: Self-pay | Admitting: *Deleted

## 2014-05-18 MED ORDER — ATORVASTATIN CALCIUM 80 MG PO TABS: 80.0000 mg | ORAL_TABLET | Freq: Every day | ORAL | Status: DC

## 2014-05-20 ENCOUNTER — Other Ambulatory Visit: Payer: Self-pay

## 2014-05-20 MED ORDER — ATORVASTATIN CALCIUM 80 MG PO TABS: 80.0000 mg | ORAL_TABLET | Freq: Every day | ORAL | Status: DC

## 2014-10-03 ENCOUNTER — Telehealth: Payer: Self-pay | Admitting: *Deleted

## 2014-10-03 NOTE — Telephone Encounter (Signed)
Pt left name, DOB and phone number. Pt states his right big toenail, is broken all the way across, no injury asked what to do.  I offered an appt so pt would not possible get the toenail torn off, but pt states it would have to be Monday next week, so I told him I would have a scheduler call him tomorrow, and schedule to have the toenail evaluated for possible removal.  I instructed pt to use epsom salt soaks as the package instructs 20 minutes, daily, the pat dry and cover with a antibiotic ointment bandaid until he was seen.  Pt agreed.

## 2014-10-13 ENCOUNTER — Ambulatory Visit (INDEPENDENT_AMBULATORY_CARE_PROVIDER_SITE_OTHER): Payer: BLUE CROSS/BLUE SHIELD | Admitting: Podiatry

## 2014-10-13 DIAGNOSIS — M779 Enthesopathy, unspecified: Secondary | ICD-10-CM

## 2014-10-13 DIAGNOSIS — L600 Ingrowing nail: Secondary | ICD-10-CM | POA: Diagnosis not present

## 2014-10-13 MED ORDER — TRIAMCINOLONE ACETONIDE 10 MG/ML IJ SUSP
10.0000 mg | Freq: Once | INTRAMUSCULAR | Status: AC
  Administered 2014-10-13: 10 mg

## 2014-10-13 NOTE — Patient Instructions (Signed)

## 2014-10-14 ENCOUNTER — Telehealth: Payer: Self-pay | Admitting: Cardiology

## 2014-10-14 NOTE — Telephone Encounter (Signed)
Jeffery [name] is calling to get an Lab order before his appt on 12/12/14.. Thanks

## 2014-10-15 NOTE — Progress Notes (Signed)
Subjective:     Patient ID: Jeffery [name], male   DOB: Mar 05, 1950, 64 y.o.   MRN: [id number]  HPI patient presents stating my big toenail right has been traumatized and I've lost the distal one one half and it's part on part off and it's painful and my big toe joint on my left has inflammation and it and it did well with the injection but it seems to gradually come back again   Review of Systems     Objective:   Physical Exam Neurovascular status intact muscle strength adequate with hallux limitus deformity left with inflammatory capsulitis and a damaged right hallux nail with the distal one half the nailbed is part on part off    Assessment:     Hallux limitus deformity left with inflammatory capsulitis and damaged nail plate right hallux    Plan:     Still premature for surgery the left is were able to keep it under correction with medication and I did explain again surgical intervention for hallux limitus but today I injected the capsule 3 mg Kenalog 5 mg Xylocaine and for the right I went ahead and I didn't fulgurated 60 mg like Marcaine mixture remove the distal one half of the nailplate clean the bed up flushed applied sterile dressing and will allow regrowth to occur with possibility for permanent removal at one point in the future

## 2014-12-05 ENCOUNTER — Telehealth: Payer: Self-pay | Admitting: Cardiology

## 2014-12-05 DIAGNOSIS — I2510 Atherosclerotic heart disease of native coronary artery without angina pectoris: Secondary | ICD-10-CM

## 2014-12-05 DIAGNOSIS — I10 Essential (primary) hypertension: Secondary | ICD-10-CM

## 2014-12-05 LAB — CBC WITH DIFFERENTIAL/PLATELET
Basophils Absolute: 0.1 10*3/uL (ref 0.0–0.1)
Basophils Relative: 1 % (ref 0–1)
Eosinophils Absolute: 0.6 10*3/uL (ref 0.0–0.7)
Eosinophils Relative: 8 % — ABNORMAL HIGH (ref 0–5)
HCT: 42.6 % (ref 39.0–52.0)
Hemoglobin: 14.5 g/dL (ref 13.0–17.0)
Lymphocytes Relative: 19 % (ref 12–46)
Lymphs Abs: 1.3 10*3/uL (ref 0.7–4.0)
MCH: 31.4 pg (ref 26.0–34.0)
MCHC: 34 g/dL (ref 30.0–36.0)
MCV: 92.2 fL (ref 78.0–100.0)
MPV: 9.4 fL (ref 8.6–12.4)
Monocytes Absolute: 0.6 10*3/uL (ref 0.1–1.0)
Monocytes Relative: 9 % (ref 3–12)
Neutro Abs: 4.3 10*3/uL (ref 1.7–7.7)
Neutrophils Relative %: 63 % (ref 43–77)
Platelets: 201 10*3/uL (ref 150–400)
RBC: 4.62 MIL/uL (ref 4.22–5.81)
RDW: 12.6 % (ref 11.5–15.5)
WBC: 6.9 10*3/uL (ref 4.0–10.5)

## 2014-12-05 LAB — HEPATIC FUNCTION PANEL
ALT: 22 U/L (ref 9–46)
AST: 23 U/L (ref 10–35)
Albumin: 4 g/dL (ref 3.6–5.1)
Alkaline Phosphatase: 60 U/L (ref 40–115)
Bilirubin, Direct: 0.1 mg/dL (ref <=0.2)
Indirect Bilirubin: 0.5 mg/dL (ref 0.2–1.2)
Total Bilirubin: 0.6 mg/dL (ref 0.2–1.2)
Total Protein: 6.5 g/dL (ref 6.1–8.1)

## 2014-12-05 LAB — LIPID PANEL
Cholesterol: 126 mg/dL (ref 125–200)
HDL: 61 mg/dL (ref >=40)
LDL Cholesterol: 57 mg/dL (ref <130)
Total CHOL/HDL Ratio: 2.1 Ratio (ref <=5.0)
Triglycerides: 41 mg/dL (ref <150)
VLDL: 8 mg/dL (ref <30)

## 2014-12-05 LAB — BASIC METABOLIC PANEL
BUN: 14 mg/dL (ref 7–25)
CO2: 28 mmol/L (ref 20–31)
Calcium: 9.2 mg/dL (ref 8.6–10.3)
Chloride: 105 mmol/L (ref 98–110)
Creat: 1.06 mg/dL (ref 0.70–1.25)
Glucose, Bld: 106 mg/dL — ABNORMAL HIGH (ref 65–99)
Potassium: 5 mmol/L (ref 3.5–5.3)
Sodium: 141 mmol/L (ref 135–146)

## 2014-12-05 NOTE — Telephone Encounter (Signed)
Received a call from [facility] with [facility] lab calling to get lab order.Advised to draw a bmet,lipid,hepatic,cbc.

## 2014-12-05 NOTE — Telephone Encounter (Signed)
Jeffery [name] is calling stating that the pt is in the office and he needs lab orders.   Thanks

## 2014-12-06 ENCOUNTER — Encounter: Payer: Self-pay | Admitting: *Deleted
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
## 2014-12-12 ENCOUNTER — Encounter: Payer: Self-pay | Admitting: Cardiology

## 2014-12-12 ENCOUNTER — Ambulatory Visit (INDEPENDENT_AMBULATORY_CARE_PROVIDER_SITE_OTHER): Payer: BLUE CROSS/BLUE SHIELD | Admitting: Cardiology

## 2014-12-12 VITALS — BP 152/90 | HR 63 | Ht 69.0 in | Wt 248.0 lb

## 2014-12-12 DIAGNOSIS — I10 Essential (primary) hypertension: Secondary | ICD-10-CM

## 2014-12-12 DIAGNOSIS — I2510 Atherosclerotic heart disease of native coronary artery without angina pectoris: Secondary | ICD-10-CM

## 2014-12-12 NOTE — Assessment & Plan Note (Signed)
Blood pressure elevated. However he states it is typically controlled. I have asked him to follow this at home and we will add a medications as needed.

## 2014-12-12 NOTE — Patient Instructions (Signed)
Your physician wants you to follow-up in: ONE YEAR WITH DR CRENSHAW You will receive a reminder letter in the mail two months in advance. If you don't receive a letter, please call our office to schedule the follow-up appointment.   If you need a refill on your cardiac medications before your next appointment, please call your pharmacy.  

## 2014-12-12 NOTE — Assessment & Plan Note (Signed)
Continue statin. 

## 2014-12-12 NOTE — Assessment & Plan Note (Signed)
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 He will contact us when he needs it scheduled.

## 2014-12-22 ENCOUNTER — Other Ambulatory Visit: Payer: Self-pay | Admitting: *Deleted

## 2014-12-22 DIAGNOSIS — I2510 Atherosclerotic heart disease of native coronary artery without angina pectoris: Secondary | ICD-10-CM

## 2014-12-22 DIAGNOSIS — I10 Essential (primary) hypertension: Secondary | ICD-10-CM

## 2014-12-22 MED ORDER — LISINOPRIL 40 MG PO TABS: 40.0000 mg | ORAL_TABLET | Freq: Every day | ORAL | Status: DC

## 2015-01-17 ENCOUNTER — Telehealth: Payer: Self-pay | Admitting: Cardiology

## 2015-01-17 DIAGNOSIS — I2510 Atherosclerotic heart disease of native coronary artery without angina pectoris: Secondary | ICD-10-CM

## 2015-01-17 NOTE — Telephone Encounter (Signed)
Increase coreg to 6.25 bid Kirk Ruths

## 2015-01-17 NOTE — Telephone Encounter (Signed)
Spoke with pt, his bp is still running 146-155/70-80's. He will occ get a slight headache. He wanted to know if he needs to increase his meds. Will forward for dr Stanford Breed review

## 2015-01-17 NOTE — Telephone Encounter (Signed)
Please call,been having headaches,thinks his blood pressure is still up. He now thinks he needs something for his blood pressure.

## 2015-01-18 MED ORDER — CARVEDILOL 6.25 MG PO TABS: 6.2500 mg | ORAL_TABLET | Freq: Two times a day (BID) | ORAL | Status: DC

## 2015-01-18 NOTE — Telephone Encounter (Signed)
Spoke with pt, Aware of dr Jacalyn Lefevre recommendations.  New script sent to pharmacy.

## 2015-01-30 ENCOUNTER — Other Ambulatory Visit: Payer: Self-pay | Admitting: Cardiology

## 2015-01-30 NOTE — Telephone Encounter (Signed)
Rx request sent to pharmacy.  

## 2015-02-02 ENCOUNTER — Other Ambulatory Visit (HOSPITAL_COMMUNITY): Payer: BLUE CROSS/BLUE SHIELD

## 2015-02-16 ENCOUNTER — Encounter: Payer: Self-pay | Admitting: Podiatry

## 2015-02-16 ENCOUNTER — Ambulatory Visit (INDEPENDENT_AMBULATORY_CARE_PROVIDER_SITE_OTHER): Payer: BLUE CROSS/BLUE SHIELD | Admitting: Podiatry

## 2015-02-16 ENCOUNTER — Ambulatory Visit (INDEPENDENT_AMBULATORY_CARE_PROVIDER_SITE_OTHER): Payer: BLUE CROSS/BLUE SHIELD

## 2015-02-16 VITALS — BP 166/97 | HR 68 | Resp 12

## 2015-02-16 DIAGNOSIS — M1000 Idiopathic gout, unspecified site: Secondary | ICD-10-CM

## 2015-02-16 DIAGNOSIS — M79672 Pain in left foot: Secondary | ICD-10-CM

## 2015-02-16 DIAGNOSIS — M205X2 Other deformities of toe(s) (acquired), left foot: Secondary | ICD-10-CM

## 2015-02-16 DIAGNOSIS — M779 Enthesopathy, unspecified: Secondary | ICD-10-CM | POA: Diagnosis not present

## 2015-02-16 MED ORDER — TRIAMCINOLONE ACETONIDE 10 MG/ML IJ SUSP
10.0000 mg | Freq: Once | INTRAMUSCULAR | Status: AC
  Administered 2015-02-16: 10 mg

## 2015-02-16 NOTE — Progress Notes (Signed)
Subjective:     Patient ID: Jeffery [name], male   DOB: [birth year], 65 y.o.   MRN: [id number]  HPI patient states he's having a lot of pain around his big toe joint left foot and that yesterday return been red and was severely tender for 24 hours   Review of Systems     Objective:   Physical Exam Neurovascular status unchanged with patient noted to have discomfort around the first MPJ left with minimal redness at the current time but significant history of this occurrence    Assessment:     Possibility for gout attack along with inflammatory capsulitis hallux limitus deformity left    Plan:     H&P condition reviewed and carefully injected around the first MPJ left 3 mg Kenalog 5 mg Xylocaine and went ahead and discussed gout and gave out sheets discussing foods to be careful with. If symptoms come back we will need to consider blood work and possible medication

## 2015-02-16 NOTE — Patient Instructions (Signed)

## 2015-02-20 ENCOUNTER — Ambulatory Visit (HOSPITAL_BASED_OUTPATIENT_CLINIC_OR_DEPARTMENT_OTHER): Payer: BLUE CROSS/BLUE SHIELD

## 2015-02-20 ENCOUNTER — Ambulatory Visit (HOSPITAL_COMMUNITY): Payer: BLUE CROSS/BLUE SHIELD | Attending: Internal Medicine

## 2015-02-20 DIAGNOSIS — R0989 Other specified symptoms and signs involving the circulatory and respiratory systems: Secondary | ICD-10-CM

## 2015-02-20 DIAGNOSIS — I2510 Atherosclerotic heart disease of native coronary artery without angina pectoris: Secondary | ICD-10-CM | POA: Diagnosis not present

## 2015-02-20 DIAGNOSIS — Z8249 Family history of ischemic heart disease and other diseases of the circulatory system: Secondary | ICD-10-CM | POA: Diagnosis not present

## 2015-02-20 DIAGNOSIS — I255 Ischemic cardiomyopathy: Secondary | ICD-10-CM | POA: Insufficient documentation

## 2015-02-20 DIAGNOSIS — E785 Hyperlipidemia, unspecified: Secondary | ICD-10-CM | POA: Diagnosis not present

## 2015-02-20 DIAGNOSIS — C61 Malignant neoplasm of prostate: Secondary | ICD-10-CM | POA: Diagnosis not present

## 2015-02-20 DIAGNOSIS — Z87891 Personal history of nicotine dependence: Secondary | ICD-10-CM | POA: Insufficient documentation

## 2015-02-20 DIAGNOSIS — I10 Essential (primary) hypertension: Secondary | ICD-10-CM | POA: Diagnosis not present

## 2015-02-20 DIAGNOSIS — R931 Abnormal findings on diagnostic imaging of heart and coronary circulation: Secondary | ICD-10-CM | POA: Diagnosis not present

## 2015-02-22 LAB — ECHOCARDIOGRAM STRESS TEST
Estimated workload: 3 METS
Peak HR: 162 {beats}/min
Percent of predicted max HR: 103 %
Stage 1 DBP: 100 mmHg
Stage 1 Grade: 0 %
Stage 1 HR: 78 {beats}/min
Stage 1 SBP: 147 mmHg
Stage 1 Speed: 0 mph
Stage 2 DBP: 94 mmHg
Stage 2 Grade: 0 %
Stage 2 HR: 77 {beats}/min
Stage 2 SBP: 173 mmHg
Stage 2 Speed: 0 mph
Stage 3 Grade: 0 %
Stage 3 HR: 78 {beats}/min
Stage 3 Speed: 0 mph
Stage 4 DBP: 102 mmHg
Stage 4 Grade: 10 %
Stage 4 HR: 118 {beats}/min
Stage 4 SBP: 207 mmHg
Stage 4 Speed: 1.7 mph
Stage 5 DBP: 100 mmHg
Stage 5 Grade: 12 %
Stage 5 HR: 146 {beats}/min
Stage 5 SBP: 210 mmHg
Stage 5 Speed: 2.5 mph
Stage 6 HR: 162 {beats}/min
Stage 7 DBP: 100 mmHg
Stage 7 Grade: 11.8 %
Stage 7 HR: 129 {beats}/min
Stage 7 SBP: 211 mmHg
Stage 7 Speed: 0 mph
Stage 8 DBP: 86 mmHg
Stage 8 Grade: 0 %
Stage 8 HR: 90 {beats}/min
Stage 8 SBP: 160 mmHg
Stage 8 Speed: 0 mph

## 2015-02-23 ENCOUNTER — Telehealth: Payer: Self-pay | Admitting: Cardiology

## 2015-02-23 NOTE — Telephone Encounter (Signed)
Pt is returning Debra's call from yesterday about the results from his Stress Echo from 2/13. Please f/u with the pt  Thanks

## 2015-02-23 NOTE — Telephone Encounter (Signed)
Pt aware he needs follow-up appt. Pt needs Monday appt to come back due to his work schedule and needs results before he has his scheduled DOT physical scheduled for early March. Pt would preferr if Dr. Stanford Breed would call him but okay if Nurse will call him back when she returns to office.

## 2015-02-24 ENCOUNTER — Telehealth: Payer: Self-pay | Admitting: Cardiology

## 2015-02-24 NOTE — Telephone Encounter (Signed)
This is concerning Dr. Jacalyn Lefevre recommendation for pt to return for [place].  Wants to speak to Dr. Stanford Breed or Hilda Blades.

## 2015-02-24 NOTE — Telephone Encounter (Signed)
Spoke with pt, Follow up scheduled with dr Stanford Breed to discuss stress echo

## 2015-02-24 NOTE — Telephone Encounter (Signed)
New message ° ° ° ° ° °Calling to get echo results °

## 2015-03-01 ENCOUNTER — Other Ambulatory Visit (INDEPENDENT_AMBULATORY_CARE_PROVIDER_SITE_OTHER): Payer: BLUE CROSS/BLUE SHIELD | Admitting: *Deleted

## 2015-03-01 ENCOUNTER — Ambulatory Visit (INDEPENDENT_AMBULATORY_CARE_PROVIDER_SITE_OTHER): Payer: BLUE CROSS/BLUE SHIELD | Admitting: Cardiology

## 2015-03-01 ENCOUNTER — Encounter: Payer: Self-pay | Admitting: Cardiology

## 2015-03-01 ENCOUNTER — Encounter: Payer: Self-pay | Admitting: *Deleted

## 2015-03-01 VITALS — BP 129/82 | HR 52 | Ht 69.0 in | Wt 261.8 lb

## 2015-03-01 DIAGNOSIS — R9439 Abnormal result of other cardiovascular function study: Secondary | ICD-10-CM

## 2015-03-01 DIAGNOSIS — I10 Essential (primary) hypertension: Secondary | ICD-10-CM | POA: Diagnosis not present

## 2015-03-01 LAB — PROTIME-INR
INR: 0.95 (ref <1.50)
Prothrombin Time: 12.8 seconds (ref 11.6–15.2)

## 2015-03-01 LAB — BASIC METABOLIC PANEL WITH GFR
BUN: 19 mg/dL (ref 7–25)
CO2: 28 mmol/L (ref 20–31)
Calcium: 9.4 mg/dL (ref 8.6–10.3)
Chloride: 107 mmol/L (ref 98–110)
Creat: 1.03 mg/dL (ref 0.70–1.25)
Glucose, Bld: 100 mg/dL — ABNORMAL HIGH (ref 65–99)
Potassium: 5.2 mmol/L (ref 3.5–5.3)
Sodium: 141 mmol/L (ref 135–146)

## 2015-03-01 LAB — CBC
HCT: 41.2 % (ref 39.0–52.0)
Hemoglobin: 13.8 g/dL (ref 13.0–17.0)
MCH: 31.2 pg (ref 26.0–34.0)
MCHC: 33.5 g/dL (ref 30.0–36.0)
MCV: 93 fL (ref 78.0–100.0)
MPV: 9.4 fL (ref 8.6–12.4)
Platelets: 205 10*3/uL (ref 150–400)
RBC: 4.43 MIL/uL (ref 4.22–5.81)
RDW: 12.7 % (ref 11.5–15.5)
WBC: 7 10*3/uL (ref 4.0–10.5)

## 2015-03-01 NOTE — Assessment & Plan Note (Signed)
Continue aspirin and statin. His stress echocardiogram suggesting ischemia in 2 distributions.This was performed for DOT physical. We will plan to proceed with a cardiac catheterization for definitive evaluation. The risks and benefits including but not limited to myocardial infarction, death and CVA were discussed and patient agrees to proceed.

## 2015-03-01 NOTE — Assessment & Plan Note (Signed)
Blood pressure controlled. Continue present medications. 

## 2015-03-01 NOTE — Patient Instructions (Signed)
Medication Instructions:   NO CHANGE  Labwork:  Your physician recommends that you return for lab work TODAY  Testing/Procedures:  Your physician has requested that you have a cardiac catheterization. Cardiac catheterization is used to diagnose and/or treat various heart conditions. Doctors may recommend this procedure for a number of different reasons. The most common reason is to evaluate chest pain. Chest pain can be a symptom of coronary artery disease (CAD), and cardiac catheterization can show whether plaque is narrowing or blocking your heart's arteries. This procedure is also used to evaluate the valves, as well as measure the blood flow and oxygen levels in different parts of your heart. For further information please visit [email or web address]. Please follow instruction sheet, as given.    Follow-Up:  Your physician recommends that you schedule a follow-up appointment in: [place]
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
## 2015-03-01 NOTE — Assessment & Plan Note (Signed)
Continue statin. 

## 2015-03-06 ENCOUNTER — Encounter (HOSPITAL_COMMUNITY): Payer: Self-pay | Admitting: Cardiovascular Disease

## 2015-03-06 ENCOUNTER — Ambulatory Visit (HOSPITAL_COMMUNITY)
Admission: RE | Admit: 2015-03-06 | Discharge: 2015-03-06 | Disposition: A | Discharge status: Home / Self Care | Payer: BLUE CROSS/BLUE SHIELD | Source: Ambulatory Visit | Attending: Cardiovascular Disease | Admitting: Cardiovascular Disease

## 2015-03-06 ENCOUNTER — Encounter (HOSPITAL_COMMUNITY)
Admission: RE | Disposition: A | Discharge status: Home / Self Care | Payer: Self-pay | Source: Ambulatory Visit | Attending: Cardiovascular Disease

## 2015-03-06 DIAGNOSIS — Z87891 Personal history of nicotine dependence: Secondary | ICD-10-CM | POA: Insufficient documentation

## 2015-03-06 DIAGNOSIS — Z955 Presence of coronary angioplasty implant and graft: Secondary | ICD-10-CM | POA: Diagnosis not present

## 2015-03-06 DIAGNOSIS — Z8546 Personal history of malignant neoplasm of prostate: Secondary | ICD-10-CM | POA: Insufficient documentation

## 2015-03-06 DIAGNOSIS — Z7982 Long term (current) use of aspirin: Secondary | ICD-10-CM | POA: Diagnosis not present

## 2015-03-06 DIAGNOSIS — I252 Old myocardial infarction: Secondary | ICD-10-CM | POA: Diagnosis not present

## 2015-03-06 DIAGNOSIS — E785 Hyperlipidemia, unspecified: Secondary | ICD-10-CM | POA: Diagnosis not present

## 2015-03-06 DIAGNOSIS — R9439 Abnormal result of other cardiovascular function study: Secondary | ICD-10-CM

## 2015-03-06 DIAGNOSIS — I10 Essential (primary) hypertension: Secondary | ICD-10-CM

## 2015-03-06 DIAGNOSIS — Z8249 Family history of ischemic heart disease and other diseases of the circulatory system: Secondary | ICD-10-CM | POA: Diagnosis not present

## 2015-03-06 DIAGNOSIS — I255 Ischemic cardiomyopathy: Secondary | ICD-10-CM | POA: Insufficient documentation

## 2015-03-06 DIAGNOSIS — I2510 Atherosclerotic heart disease of native coronary artery without angina pectoris: Secondary | ICD-10-CM | POA: Insufficient documentation

## 2015-03-06 HISTORY — PX: CARDIAC CATHETERIZATION: SHX172

## 2015-03-06 SURGERY — LEFT HEART CATH AND CORONARY ANGIOGRAPHY

## 2015-03-06 MED ORDER — HEPARIN SODIUM (PORCINE) 1000 UNIT/ML IJ SOLN
INTRAMUSCULAR | Status: DC | PRN
  Administered 2015-03-06: 5500 [IU] via INTRAVENOUS

## 2015-03-06 MED ORDER — SODIUM CHLORIDE 0.9% FLUSH: 3.0000 mL | INTRAVENOUS | Status: DC | PRN

## 2015-03-06 MED ORDER — HEPARIN SODIUM (PORCINE) 1000 UNIT/ML IJ SOLN
INTRAMUSCULAR | Status: AC
  Filled 2015-03-06: qty 1

## 2015-03-06 MED ORDER — HEPARIN (PORCINE) IN NACL 2-0.9 UNIT/ML-% IJ SOLN
INTRAMUSCULAR | Status: DC | PRN
  Administered 2015-03-06: 12:00:00

## 2015-03-06 MED ORDER — MIDAZOLAM HCL 2 MG/2ML IJ SOLN
INTRAMUSCULAR | Status: AC
  Filled 2015-03-06: qty 2

## 2015-03-06 MED ORDER — SODIUM CHLORIDE 0.9% FLUSH: 3.0000 mL | Freq: Two times a day (BID) | INTRAVENOUS | Status: DC

## 2015-03-06 MED ORDER — IOHEXOL 350 MG/ML SOLN
INTRAVENOUS | Status: DC | PRN
  Administered 2015-03-06: 60 mL via INTRA_ARTERIAL

## 2015-03-06 MED ORDER — HEPARIN (PORCINE) IN NACL 2-0.9 UNIT/ML-% IJ SOLN
INTRAMUSCULAR | Status: DC | PRN
  Administered 2015-03-06: 500 mL

## 2015-03-06 MED ORDER — SODIUM CHLORIDE 0.9 % WEIGHT BASED INFUSION: 1.0000 mL/kg/h | INTRAVENOUS | Status: DC

## 2015-03-06 MED ORDER — MIDAZOLAM HCL 2 MG/2ML IJ SOLN
INTRAMUSCULAR | Status: DC | PRN
  Administered 2015-03-06: 2 mg via INTRAVENOUS

## 2015-03-06 MED ORDER — FENTANYL CITRATE (PF) 100 MCG/2ML IJ SOLN
INTRAMUSCULAR | Status: DC | PRN
  Administered 2015-03-06: 50 ug via INTRAVENOUS

## 2015-03-06 MED ORDER — VERAPAMIL HCL 2.5 MG/ML IV SOLN
INTRAVENOUS | Status: AC
  Filled 2015-03-06: qty 2

## 2015-03-06 MED ORDER — SODIUM CHLORIDE 0.9 % IV SOLN: 250.0000 mL | INTRAVENOUS | Status: DC | PRN

## 2015-03-06 MED ORDER — VERAPAMIL HCL 2.5 MG/ML IV SOLN
INTRAVENOUS | Status: DC | PRN
  Administered 2015-03-06: 11:00:00 via INTRA_ARTERIAL

## 2015-03-06 MED ORDER — HEPARIN (PORCINE) IN NACL 2-0.9 UNIT/ML-% IJ SOLN
INTRAMUSCULAR | Status: AC
  Filled 2015-03-06: qty 500

## 2015-03-06 MED ORDER — SODIUM CHLORIDE 0.9 % IV SOLN: INTRAVENOUS | Status: DC

## 2015-03-06 MED ORDER — ASPIRIN 81 MG PO CHEW: 81.0000 mg | CHEWABLE_TABLET | ORAL | Status: DC

## 2015-03-06 MED ORDER — FENTANYL CITRATE (PF) 100 MCG/2ML IJ SOLN
INTRAMUSCULAR | Status: AC
  Filled 2015-03-06: qty 2

## 2015-03-06 MED ORDER — LIDOCAINE HCL (PF) 1 % IJ SOLN
INTRAMUSCULAR | Status: AC
  Filled 2015-03-06: qty 30

## 2015-03-06 MED ORDER — SODIUM CHLORIDE 0.9 % WEIGHT BASED INFUSION
3.0000 mL/kg/h | INTRAVENOUS | Status: DC
  Administered 2015-03-06: 3 mL/kg/h via INTRAVENOUS

## 2015-03-06 MED ORDER — HEPARIN (PORCINE) IN NACL 2-0.9 UNIT/ML-% IJ SOLN
INTRAMUSCULAR | Status: AC
  Filled 2015-03-06: qty 1000

## 2015-03-06 SURGICAL SUPPLY — 11 items
CATH INFINITI 5 FR JL3.5 (CATHETERS) ×2 IMPLANT
CATH INFINITI 5FR ANG PIGTAIL (CATHETERS) ×2 IMPLANT
CATH INFINITI JR4 5F (CATHETERS) ×2 IMPLANT
DEVICE RAD COMP TR BAND LRG (VASCULAR PRODUCTS) ×2 IMPLANT
GLIDESHEATH SLEND SS 6F .021 (SHEATH) ×2 IMPLANT
KIT HEART LEFT (KITS) ×2 IMPLANT
PACK CARDIAC CATHETERIZATION (CUSTOM PROCEDURE TRAY) ×2 IMPLANT
SYR MEDRAD MARK V 150ML (SYRINGE) ×1 IMPLANT
TRANSDUCER W/STOPCOCK (MISCELLANEOUS) ×2 IMPLANT
TUBING CIL FLEX 10 FLL-RA (TUBING) ×2 IMPLANT
WIRE SAFE-T 1.5MM-J .035X260CM (WIRE) ×2 IMPLANT

## 2015-03-06 NOTE — Interval H&P Note (Signed)
History and Physical Interval Note:  03/06/2015 10:52 AM  Jeffery [name]  has presented today for cardiac cath with the diagnosis of abnormal stress echo, CAD.  The various methods of treatment have been discussed with the patient and family. After consideration of risks, benefits and other options for treatment, the patient has consented to  Procedure(s): Left Heart Cath and Coronary Angiography (N/A) as a surgical intervention .  The patient's history has been reviewed, patient examined, no change in status, stable for surgery.  I have reviewed the patient's chart and labs.  Questions were answered to the patient's satisfaction.    Cath Lab Visit (complete for each Cath Lab visit)  Clinical Evaluation Leading to the Procedure:   ACS: No.  Non-ACS:    Anginal Classification: No Symptoms  Anti-ischemic medical therapy: Minimal Therapy (1 class of medications)  Non-Invasive Test Results: High-risk stress test findings: cardiac mortality >3%/year  Prior CABG: No previous CABG         Jeffery [name]

## 2015-03-06 NOTE — Discharge Instructions (Signed)
Radial Site Care °Refer to this sheet in the next few weeks. These instructions provide you with information about caring for yourself after your procedure. Your health care provider may also give you more specific instructions. Your treatment has been planned according to current medical practices, but problems sometimes occur. Call your health care provider if you have any problems or questions after your procedure. °WHAT TO EXPECT AFTER THE PROCEDURE °After your procedure, it is typical to have the following: °· Bruising at the radial site that usually fades within 1-2 weeks. °· Blood collecting in the tissue (hematoma) that may be painful to the touch. It should usually decrease in size and tenderness within 1-2 weeks. °HOME CARE INSTRUCTIONS °· Take medicines only as directed by your health care provider. °· You may shower 24-48 hours after the procedure or as directed by your health care provider. Remove the bandage (dressing) and gently wash the site with plain soap and water. Pat the area dry with a clean towel. Do not rub the site, because this may cause bleeding. °· Do not take baths, swim, or use a hot tub until your health care provider approves. °· Check your insertion site every day for redness, swelling, or drainage. °· Do not apply powder or lotion to the site. °· Do not flex or bend the affected arm for 24 hours or as directed by your health care provider. °· Do not push or pull heavy objects with the affected arm for 24 hours or as directed by your health care provider. °· Do not lift over 10 lb (4.5 kg) for 5 days after your procedure or as directed by your health care provider. °· Ask your health care provider when it is okay to: °¨ Return to work or school. °¨ Resume usual physical activities or sports. °¨ Resume sexual activity. °· Do not drive home if you are discharged the same day as the procedure. Have someone else drive you. °· You may drive 24 hours after the procedure unless otherwise  instructed by your health care provider. °· Do not operate machinery or power tools for 24 hours after the procedure. °· If your procedure was done as an outpatient procedure, which means that you went home the same day as your procedure, a responsible adult should be with you for the first 24 hours after you arrive home. °· Keep all follow-up visits as directed by your health care provider. This is important. °SEEK MEDICAL CARE IF: °· You have a fever. °· You have chills. °· You have increased bleeding from the radial site. Hold pressure on the site. °SEEK IMMEDIATE MEDICAL CARE IF: °· You have unusual pain at the radial site. °· You have redness, warmth, or swelling at the radial site. °· You have drainage (other than a small amount of blood on the dressing) from the radial site. °· The radial site is bleeding, and the bleeding does not stop after 30 minutes of holding steady pressure on the site. °· Your arm or hand becomes pale, cool, tingly, or numb. °  °This information is not intended to replace advice given to you by your health care provider. Make sure you discuss any questions you have with your health care provider. °  °Document Released: 01/26/2010 Document Revised: 01/14/2014 Document Reviewed: 07/12/2013 °Elsevier Interactive Patient Education ©2016 [employer] ° °
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
## 2015-03-08 ENCOUNTER — Telehealth: Payer: Self-pay | Admitting: Cardiology

## 2015-03-08 NOTE — Telephone Encounter (Signed)
Patient dropped of [employer] "specified event claim form" for MD to complete.  This form has been placed in MD mailbox.  Message routed to D. Lonia Blood, Therapist, sports as Juluis Rainier

## 2015-03-09 NOTE — Telephone Encounter (Signed)
Spoke with pt, aware paperwork has been filled out and placed in the mail to his home address.

## 2015-03-31 ENCOUNTER — Telehealth: Payer: Self-pay | Admitting: Cardiology

## 2015-03-31 NOTE — Telephone Encounter (Signed)
Routed to [facility], Dr, Lonia Skinner patient

## 2015-03-31 NOTE — Telephone Encounter (Signed)
New message    Patient calling did not want to disclose any information will talk with nurse.   Patient verbalized no chest pain , no sob,

## 2015-03-31 NOTE — Telephone Encounter (Signed)
Spoke with pt, questions related to admission in 2013 answered.

## 2015-03-31 NOTE — Telephone Encounter (Signed)
Will forward to Luisa Dago RN with Dr Stanford Breed
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
## 2015-04-10 ENCOUNTER — Encounter: Payer: Self-pay | Admitting: Cardiology

## 2015-04-10 ENCOUNTER — Ambulatory Visit (INDEPENDENT_AMBULATORY_CARE_PROVIDER_SITE_OTHER): Payer: BLUE CROSS/BLUE SHIELD | Admitting: Cardiology

## 2015-04-10 VITALS — BP 146/86 | HR 76 | Ht 69.5 in | Wt 264.0 lb

## 2015-04-10 DIAGNOSIS — R0989 Other specified symptoms and signs involving the circulatory and respiratory systems: Secondary | ICD-10-CM

## 2015-04-10 DIAGNOSIS — I10 Essential (primary) hypertension: Secondary | ICD-10-CM | POA: Diagnosis not present

## 2015-04-10 NOTE — Assessment & Plan Note (Signed)
Abdominal ultrasound to exclude aneurysm. 

## 2015-04-10 NOTE — Assessment & Plan Note (Signed)
Continue statin. 

## 2015-04-10 NOTE — Patient Instructions (Signed)
Medication Instructions:   NO CHANGE  Testing/Procedures:  Your physician has requested that you have an abdominal aorta duplex. During this test, an ultrasound is used to evaluate the aorta. Allow 30 minutes for this exam. Do not eat after midnight the day before and avoid carbonated beverages   Follow-Up:  Your physician wants you to follow-up in: ONE YEAR WITH DR CRENSHAW You will receive a reminder letter in the mail two months in advance. If you don't receive a letter, please call our office to schedule the follow-up appointment.    

## 2015-04-10 NOTE — Assessment & Plan Note (Signed)
Continue ASA and statin. Cath revealed patent stents.

## 2015-04-10 NOTE — Assessment & Plan Note (Signed)
BP mildly elevated; follow and increase meds as needed.

## 2015-04-17 ENCOUNTER — Telehealth: Payer: Self-pay | Admitting: Cardiology

## 2015-04-17 NOTE — Telephone Encounter (Signed)
Pt called and needs a call back about some paper work from [employer].   Pt needs a call back today please.

## 2015-04-17 NOTE — Telephone Encounter (Signed)
Called pt back. Pt needing name, address, and phone number for the office to send to [place]. Pt stated [employer] needs to call to get additional information about the pt in order to complete his claim. The information was given to the pt.

## 2015-04-24 ENCOUNTER — Ambulatory Visit (HOSPITAL_COMMUNITY)
Admission: RE | Admit: 2015-04-24 | Discharge: 2015-04-24 | Disposition: A | Discharge status: Home / Self Care | Payer: BLUE CROSS/BLUE SHIELD | Source: Ambulatory Visit | Attending: Cardiology | Admitting: Cardiology

## 2015-04-24 DIAGNOSIS — I10 Essential (primary) hypertension: Secondary | ICD-10-CM | POA: Insufficient documentation

## 2015-04-24 DIAGNOSIS — K219 Gastro-esophageal reflux disease without esophagitis: Secondary | ICD-10-CM | POA: Diagnosis not present

## 2015-04-24 DIAGNOSIS — E782 Mixed hyperlipidemia: Secondary | ICD-10-CM | POA: Insufficient documentation

## 2015-04-24 DIAGNOSIS — I255 Ischemic cardiomyopathy: Secondary | ICD-10-CM | POA: Insufficient documentation

## 2015-04-24 DIAGNOSIS — R0989 Other specified symptoms and signs involving the circulatory and respiratory systems: Secondary | ICD-10-CM | POA: Insufficient documentation

## 2015-04-24 DIAGNOSIS — I2510 Atherosclerotic heart disease of native coronary artery without angina pectoris: Secondary | ICD-10-CM | POA: Diagnosis not present

## 2015-05-01 ENCOUNTER — Encounter: Payer: Self-pay | Admitting: Cardiology

## 2015-05-01 NOTE — Telephone Encounter (Signed)
This encounter was created in error - please disregard.

## 2015-05-01 NOTE — Telephone Encounter (Signed)
New message      Talk to Jeffery [name] about his [facility]

## 2015-05-01 NOTE — Telephone Encounter (Signed)
Message routed to Debra, RN 

## 2015-05-15 ENCOUNTER — Encounter: Payer: Self-pay | Admitting: Cardiology

## 2015-05-15 NOTE — Telephone Encounter (Signed)
Pt says he is not having problems,just needs to talk to you as soon as he can.

## 2015-05-16 NOTE — Telephone Encounter (Signed)
This encounter was created in error - please disregard.

## 2015-05-17 ENCOUNTER — Telehealth: Payer: Self-pay | Admitting: Cardiology

## 2015-05-17 NOTE — Telephone Encounter (Signed)
Received [facility] Provider Questionaire in mail 5.10.17.  Date of letter April 19, 2015??  Forms given to Dr Stanford Breed to complete and sign.

## 2015-05-18 ENCOUNTER — Other Ambulatory Visit: Payer: Self-pay | Admitting: Cardiology

## 2015-05-18 ENCOUNTER — Telehealth: Payer: Self-pay | Admitting: *Deleted

## 2015-05-18 NOTE — Telephone Encounter (Signed)
Rx(s) sent to pharmacy electronically.  

## 2015-05-18 NOTE — Telephone Encounter (Signed)
[facility] paperwork faxed and copy mailed to pt.

## 2015-06-23 ENCOUNTER — Telehealth: Payer: Self-pay | Admitting: Cardiology

## 2015-06-23 NOTE — Telephone Encounter (Signed)
Spoke with pt, he was seen by his PCP for a cough. They suggested he stop the lisinopril and wanted him to start losartan 100 mg once daily and he wanted to make sure we agree. Okay given for pt to start the losartan.

## 2015-06-23 NOTE — Telephone Encounter (Signed)
New message      Talk to the nurse.  PCP want to change some of his medications but pt want to talk to the nurse first.

## 2015-06-23 NOTE — Telephone Encounter (Signed)
Left message for pt to call.

## 2015-06-28 ENCOUNTER — Telehealth: Payer: Self-pay | Admitting: Cardiology

## 2015-06-28 NOTE — Telephone Encounter (Signed)
Spoke with pt, since the start of the losartan he has noticed increased fatigue. He does check his bp at the drug store and last check was 140's/80's. He also is reporting more indigestion than usual. He will change the protonix to 1/2 tablet twice daily to see if that helps. If he has no change in his symptoms by the first of next week he will call back. He prefers we manage his bp not his PCP.

## 2015-06-28 NOTE — Telephone Encounter (Signed)
New Message  Pt requestd to speak w/ RN- would not specify nature of phone call. Please call back and discuss.

## 2015-08-18 ENCOUNTER — Other Ambulatory Visit: Payer: Self-pay | Admitting: Cardiology

## 2015-08-18 DIAGNOSIS — I2510 Atherosclerotic heart disease of native coronary artery without angina pectoris: Secondary | ICD-10-CM

## 2015-08-18 NOTE — Telephone Encounter (Signed)
Per pt call needs his [place] called in he is going out of town next week.  [facility]  [address]

## 2015-08-18 NOTE — Telephone Encounter (Signed)
REFILL 

## 2015-10-16 ENCOUNTER — Ambulatory Visit (INDEPENDENT_AMBULATORY_CARE_PROVIDER_SITE_OTHER): Payer: BLUE CROSS/BLUE SHIELD | Admitting: Podiatry

## 2015-10-16 ENCOUNTER — Ambulatory Visit (INDEPENDENT_AMBULATORY_CARE_PROVIDER_SITE_OTHER): Payer: BLUE CROSS/BLUE SHIELD

## 2015-10-16 ENCOUNTER — Encounter: Payer: Self-pay | Admitting: Podiatry

## 2015-10-16 DIAGNOSIS — M205X2 Other deformities of toe(s) (acquired), left foot: Secondary | ICD-10-CM | POA: Diagnosis not present

## 2015-10-16 DIAGNOSIS — M779 Enthesopathy, unspecified: Secondary | ICD-10-CM

## 2015-10-16 DIAGNOSIS — M79672 Pain in left foot: Principal | ICD-10-CM

## 2015-10-16 DIAGNOSIS — M79671 Pain in right foot: Secondary | ICD-10-CM | POA: Diagnosis not present

## 2015-10-16 NOTE — Patient Instructions (Signed)
Hallux Rigidus Hallux rigidus is a condition involving pain and a loss of motion of the first (big) toe. The pain gets worse with lifting up (extension) of the toe. This is usually due to arthritic bony bumps (spurring) of the joint at the base of the big toe.  SYMPTOMS   Pain, with lifting up of the toe.  Tenderness over the joint where the big toe meets the foot.  Redness, swelling, and warmth over the top of the base of the big toe (sometimes).  Foot pain, stiffness, and limping. CAUSES  Hallux rigidus is caused by arthritis of the joint where the big toe meets the foot. The arthritis creates a bone spur that pinches the soft tissues when the toe is extended. RISK INCREASES WITH:  Tight shoes with a narrow toe box.  Family history of foot problems.  Gout and rheumatoid and psoriatic arthritis.  History of previous toe injury, including "turf toe."  Long first toe, flat feet, and other big toe bony bumps.  Arthritis of the big toe. PREVENTION   Wear wide-toed shoes that fit well.  Tape the big toe to reduce motion and to prevent pinching of the tissues between the bone.  Maintain physical fitness:  Foot and ankle flexibility.  Muscle strength and endurance. PROGNOSIS  This condition can usually be managed with proper treatment. However, surgery is typically required to prevent the problem from recurring.  RELATED COMPLICATIONS  Injury to other areas of the foot or ankle, caused by abnormal walking in an attempt to avoid the pain felt when walking normally. TREATMENT Treatment first involves stopping the activities that aggravate your symptoms. Ice and medicine can be used to reduce the pain and inflammation. Modifications to shoes may help reduce pain, including wearing stiff-soled shoes, shoes with a wide toe box, inserting a padded donut to relieve pressure on top of the joint, or wearing an arch support. Corticosteroid injections may be given to reduce inflammation. If  nonsurgical treatment is unsuccessful, surgery may be needed. Surgical options include removing the arthritic bony spur, cutting a bone in the foot to change the arc of motion (allowing the toe to extend more), or fusion of the joint (eliminating all motion in the joint at the base of the big toe).  MEDICATION   If pain medicine is needed, nonsteroidal anti-inflammatory medicines (aspirin and ibuprofen), or other minor pain relievers (acetaminophen), are often advised.  Do not take pain medicine for 7 days before surgery.  Prescription pain relievers are usually prescribed only after surgery. Use only as directed and only as much as you need.  Ointments for arthritis, applied to the skin, may give some relief.  Injections of corticosteroids may be given to reduce inflammation. HEAT AND COLD  Cold treatment (icing) relieves pain and reduces inflammation. Cold treatment should be applied for 10 to 15 minutes every 2 to 3 hours, and immediately after activity that aggravates your symptoms. Use ice packs or an ice massage.  Heat treatment may be used before performing the stretching and strengthening activities prescribed by your caregiver, physical therapist, or athletic trainer. Use a heat pack or a warm water soak. SEEK MEDICAL CARE IF:   Symptoms get worse or do not improve in 2 weeks, despite treatment.  After surgery you develop fever, increasing pain, redness, swelling, drainage of fluids, bleeding, or increasing warmth.  New, unexplained symptoms develop. (Drugs used in treatment may produce side effects.)   This information is not intended to replace advice given to   you by your health care provider. Make sure you discuss any questions you have with your health care provider.   Document Released: 12/24/2004 Document Revised: 01/14/2014 Document Reviewed: 04/07/2008 Elsevier Interactive Patient Education 2016 Elsevier Inc.  

## 2015-10-17 NOTE — Progress Notes (Signed)
Subjective:     Patient ID: Jeffery [name], male   DOB: [birth year], 65 y.o.   MRN: [id number]  HPI patient states this left foot is still really bothering me and I'm probably need to get this big toe joints fixed as it simply is not improving   Review of Systems     Objective:   Physical Exam Neurovascular status intact muscle strength adequate with patient found to have exquisite discomfort left first MPJ lateral side with reduced range of motion and crepitus and pain when palpated.    Assessment:     Hallux limitus deformity left with inflammation and pain and structural changes    Plan:     H&P condition reviewed and do think ultimately this will require a biplanar-type osteotomy with removal of spurs left. Today I did scanned for orthotics to try to reduce the capsular inflammation and stabilize the hallux and patient will be seen back when ready and may consider surgery by the end of the year  X-rays indicate spur formation with narrowing of the joint surface and elevation of the first metatarsal segment

## 2015-11-06 ENCOUNTER — Telehealth: Payer: Self-pay | Admitting: Cardiology

## 2015-11-06 ENCOUNTER — Other Ambulatory Visit: Payer: Self-pay | Admitting: Cardiology

## 2015-11-06 NOTE — Telephone Encounter (Signed)
Did not need encounter °

## 2015-11-22 ENCOUNTER — Telehealth: Payer: Self-pay | Admitting: *Deleted

## 2015-11-22 NOTE — Telephone Encounter (Signed)
"  I would like to schedule surgery with Dr. Paulla Dolly.  I have held off as long as I could in hopes that it would get better."  When would you like to schedule it?  "I would like to do it sometime in December.  Give me some dates he can do it."  He can do it on December 5 or 12.  "Put me down for the 12 th and I will check with my job to make sure that date is good."  You will need to schedule a consultation appointment with Dr. Paulla Dolly to sign consent forms.  Would you like me to send you to a scheduler so you can schedule an appointment?  "Yes, please transfer me and have a blessed day."

## 2015-12-04 ENCOUNTER — Encounter: Payer: Self-pay | Admitting: Podiatry

## 2015-12-04 ENCOUNTER — Ambulatory Visit (INDEPENDENT_AMBULATORY_CARE_PROVIDER_SITE_OTHER): Payer: BLUE CROSS/BLUE SHIELD | Admitting: Podiatry

## 2015-12-04 DIAGNOSIS — M205X2 Other deformities of toe(s) (acquired), left foot: Secondary | ICD-10-CM

## 2015-12-04 NOTE — Patient Instructions (Signed)
Pre-Operative Instructions  Congratulations, you have decided to take an important step to improving your quality of life.  You can be assured that the doctors of [facility] will be with you every step of the way.  1. Plan to be at the surgery center/hospital at least 1 (one) hour prior to your scheduled time unless otherwise directed by the surgical center/hospital staff.  You must have a responsible adult accompany you, remain during the surgery and drive you home.  Make sure you have directions to the surgical center/hospital and know how to get there on time. 2. For hospital based surgery you will need to obtain a history and physical form from your family physician within 1 month prior to the date of surgery- we will give you a form for you primary physician.  3. We make every effort to accommodate the date you request for surgery.  There are however, times where surgery dates or times have to be moved.  We will contact you as soon as possible if a change in schedule is required.   4. No Aspirin/Ibuprofen for one week before surgery.  If you are on aspirin, any non-steroidal anti-inflammatory medications (Mobic, Aleve, Ibuprofen) you should stop taking it 7 days prior to your surgery.  You make take Tylenol  For pain prior to surgery.  5. Medications- If you are taking daily heart and blood pressure medications, seizure, reflux, allergy, asthma, anxiety, pain or diabetes medications, make sure the surgery center/hospital is aware before the day of surgery so they may notify you which medications to take or avoid the day of surgery. 6. No food or drink after midnight the night before surgery unless directed otherwise by surgical center/hospital staff. 7. No alcoholic beverages 24 hours prior to surgery.  No smoking 24 hours prior to or 24 hours after surgery. 8. Wear loose pants or shorts- loose enough to fit over bandages, boots, and casts. 9. No slip on shoes, sneakers are best. 10. Bring  your boot with you to the surgery center/hospital.  Also bring crutches or a walker if your physician has prescribed it for you.  If you do not have this equipment, it will be provided for you after surgery. 11. If you have not been contracted by the surgery center/hospital by the day before your surgery, call to confirm the date and time of your surgery. 12. Leave-time from work may vary depending on the type of surgery you have.  Appropriate arrangements should be made prior to surgery with your employer. 13. Prescriptions will be provided immediately following surgery by your doctor.  Have these filled as soon as possible after surgery and take the medication as directed. 14. Remove nail polish on the operative foot. 15. Wash the night before surgery.  The night before surgery wash the foot and leg well with the antibacterial soap provided and water paying special attention to beneath the toenails and in between the toes.  Rinse thoroughly with water and dry well with a towel.  Perform this wash unless told not to do so by your physician.  Enclosed: 1 Ice pack (please put in freezer the night before surgery)   1 Hibiclens skin cleaner   Pre-op Instructions  If you have any questions regarding the instructions, do not hesitate to call our office.  [place]: [address] [place], [place] [address] [phone]  [place]: [address] [place], [place] [address] [phone]  [place]: [address]  [place], [place] [address] [phone]   Dr.   Norman Regal DPM, Dr. Matthew Wagoner DPM, Dr. M. Todd Hyatt DPM, Dr. Titorya Stover DPM 

## 2015-12-04 NOTE — Progress Notes (Signed)
Subjective:     Patient ID: Jeffery [name], male   DOB: January 21, 1950, 65 y.o.   MRN: [id number]  HPI patient states he is ready to get the big joint fixed on his left foot as it's becoming increasingly tender and not responded to medication and becoming more bothersome with shoe gear   Review of Systems     Objective:   Physical Exam Neurovascular status intact muscle strength adequate with narrowing of the first MPJ left with dorsal spurring and spurring on the medial and lateral side of the first metatarsal head with lateral being worse. There is reduced range of motion upon checking with approximate 10-15 dorsiflexion 10 plantarflexion with mild crepitus noted    Assessment:     Hallux limitus rigidus condition left    Plan:     H&P condition reviewed and recommended correction. At this point a biplanar osteotomy with removal of spurring and possible subchondral drilling was discussed and recommended and patient wants surgery and first read consent form reviewing alternative treatments and complications. I did explain there may be cartilage damage and that ultimately he may require fusion or joint implantation procedure and that full recovery from this generally take 6 months. Patient had air fracture walker dispensed for the postoperative usage today

## 2015-12-18 ENCOUNTER — Telehealth: Payer: Self-pay | Admitting: *Deleted

## 2015-12-18 NOTE — Telephone Encounter (Signed)
Pt asked if he could take his trazodone at bedtime. Dr. Paulla Dolly states yes. Informed pt.

## 2015-12-18 NOTE — Telephone Encounter (Signed)
Pt states he didn't read his surgery booklet until Saturday, and he didn't know he was to stop his Aspirin, but he hasn't had it since Saturday. I told him I would ask Dr. Paulla Dolly if that was okay and often Dr. Paulla Dolly doesn't take people off of Aspirin prior to surgery.  Dr. Paulla Dolly states that is fine. I gave pt Dr. Paulla Dolly okay.

## 2015-12-19 ENCOUNTER — Telehealth: Payer: Self-pay | Admitting: Cardiology

## 2015-12-19 ENCOUNTER — Encounter: Payer: Self-pay | Admitting: Podiatry

## 2015-12-19 DIAGNOSIS — M2022 Hallux rigidus, left foot: Secondary | ICD-10-CM | POA: Diagnosis not present

## 2015-12-19 NOTE — Telephone Encounter (Signed)
pt calling regarding having foot surgery and was told he can take Ibuprofen but to check with [place] to make sure it would be ok to take with his heart meds-for just a few days per pt

## 2015-12-19 NOTE — Telephone Encounter (Signed)
Okay to take with current medication. Need to limit to 3-5 day  and always with food/snack

## 2015-12-19 NOTE — Telephone Encounter (Signed)
Recommendations given to patient, who voiced understanding and thanks.

## 2015-12-19 NOTE — Telephone Encounter (Signed)
Re: ibuprofen  Would think this would need to be limited in use or avoided altogether - will seek recommendations from pharmD.

## 2015-12-20 ENCOUNTER — Telehealth: Payer: Self-pay | Admitting: *Deleted

## 2015-12-20 NOTE — Telephone Encounter (Addendum)
Pt states he spoke to surgical center and they said he had to sleep in the boot, but he didn't last night and had no problem and slept with the foot of pillows. I told pt that we wanted him to sleep with the boot on, but if he found he could not, then he should take it off, and put it on if he needed to be up walking. Pt states understanding. 02/08/2016-Pt states he was given a compression anklet to wear after surgery, how long does he have to wear it. I told pt that he needed to put it on before swinging his legs over the side of the bed and wear until he goes to bed. Pt states he wears OTC compression hoses with the highest pressure for varicose veins, does he need to wear those and the anklet. I told him wear the compression hose they would provide more even consistent pressure to help with edema. Pt states he feels like the bottom of the ball of his foot has a knot. I told him that he still had swelling and the shift of his bone during the surgery would also cause a shift of tissue that should eventually be accommodated for, but to ask Dr. Paulla Dolly at the next visit. Pt states understanding.

## 2015-12-21 ENCOUNTER — Telehealth: Payer: Self-pay | Admitting: *Deleted

## 2015-12-21 NOTE — Progress Notes (Signed)
DOS 12.12.2017 Bi-Planar [place] with Pin Fixation Left

## 2015-12-21 NOTE — Telephone Encounter (Signed)
"  I am scheduled for surgery on tomorrow with Dr. Paulla Dolly.  I have already spoken to another person there but I forgot to ask an important question please give me a call back."  Patient spoke to someone else and got his response.

## 2015-12-22 ENCOUNTER — Other Ambulatory Visit: Payer: Self-pay | Admitting: Cardiology

## 2015-12-26 ENCOUNTER — Other Ambulatory Visit: Payer: Self-pay | Admitting: Cardiology

## 2015-12-27 ENCOUNTER — Ambulatory Visit (INDEPENDENT_AMBULATORY_CARE_PROVIDER_SITE_OTHER): Payer: BLUE CROSS/BLUE SHIELD | Admitting: Podiatry

## 2015-12-27 ENCOUNTER — Ambulatory Visit (INDEPENDENT_AMBULATORY_CARE_PROVIDER_SITE_OTHER): Payer: BLUE CROSS/BLUE SHIELD

## 2015-12-27 ENCOUNTER — Encounter: Payer: Self-pay | Admitting: Podiatry

## 2015-12-27 VITALS — Temp 99.6°F

## 2015-12-27 DIAGNOSIS — M205X2 Other deformities of toe(s) (acquired), left foot: Secondary | ICD-10-CM

## 2015-12-27 DIAGNOSIS — Z9889 Other specified postprocedural states: Secondary | ICD-10-CM

## 2015-12-27 NOTE — Progress Notes (Signed)
Subjective: Patient presents today status post bunionectomy to the left foot performed by Dr. Paulla Dolly. Patient states he is doing well.  Objective: Incision site is well coapted with sutures intact. Moderate edema and ecchymosis noted. No sign of infectious process.  Assessment: Status post forefoot surgery left foot  Plan of care: Patient was evaluated. Dressings were changed. Postoperative shoe dispensed. Return to clinic in 2 weeks

## 2016-01-08 HISTORY — PX: TOE SURGERY: SHX1073

## 2016-01-10 ENCOUNTER — Other Ambulatory Visit: Payer: Self-pay | Admitting: Gastroenterology

## 2016-01-11 ENCOUNTER — Encounter (HOSPITAL_COMMUNITY): Payer: Self-pay

## 2016-01-16 ENCOUNTER — Ambulatory Visit (HOSPITAL_COMMUNITY): Payer: BLUE CROSS/BLUE SHIELD | Admitting: Registered Nurse

## 2016-01-16 ENCOUNTER — Ambulatory Visit (HOSPITAL_COMMUNITY)
Admission: RE | Admit: 2016-01-16 | Discharge: 2016-01-16 | Disposition: A | Discharge status: Home / Self Care | Payer: BLUE CROSS/BLUE SHIELD | Source: Ambulatory Visit | Attending: Gastroenterology | Admitting: Gastroenterology

## 2016-01-16 ENCOUNTER — Encounter (HOSPITAL_COMMUNITY): Payer: Self-pay

## 2016-01-16 ENCOUNTER — Encounter (HOSPITAL_COMMUNITY)
Admission: RE | Disposition: A | Discharge status: Home / Self Care | Payer: Self-pay | Source: Ambulatory Visit | Attending: Gastroenterology

## 2016-01-16 DIAGNOSIS — K219 Gastro-esophageal reflux disease without esophagitis: Secondary | ICD-10-CM | POA: Diagnosis not present

## 2016-01-16 DIAGNOSIS — I2510 Atherosclerotic heart disease of native coronary artery without angina pectoris: Secondary | ICD-10-CM | POA: Diagnosis not present

## 2016-01-16 DIAGNOSIS — L719 Rosacea, unspecified: Secondary | ICD-10-CM | POA: Diagnosis not present

## 2016-01-16 DIAGNOSIS — I252 Old myocardial infarction: Secondary | ICD-10-CM | POA: Insufficient documentation

## 2016-01-16 DIAGNOSIS — Z87891 Personal history of nicotine dependence: Secondary | ICD-10-CM | POA: Diagnosis not present

## 2016-01-16 DIAGNOSIS — Z955 Presence of coronary angioplasty implant and graft: Secondary | ICD-10-CM | POA: Diagnosis not present

## 2016-01-16 DIAGNOSIS — Z9049 Acquired absence of other specified parts of digestive tract: Secondary | ICD-10-CM | POA: Diagnosis not present

## 2016-01-16 DIAGNOSIS — E7800 Pure hypercholesterolemia, unspecified: Secondary | ICD-10-CM | POA: Diagnosis not present

## 2016-01-16 DIAGNOSIS — Z1211 Encounter for screening for malignant neoplasm of colon: Secondary | ICD-10-CM | POA: Insufficient documentation

## 2016-01-16 DIAGNOSIS — I10 Essential (primary) hypertension: Secondary | ICD-10-CM | POA: Insufficient documentation

## 2016-01-16 DIAGNOSIS — Z86010 Personal history of colonic polyps: Secondary | ICD-10-CM | POA: Diagnosis not present

## 2016-01-16 HISTORY — PX: COLONOSCOPY WITH PROPOFOL: SHX5780

## 2016-01-16 SURGERY — COLONOSCOPY WITH PROPOFOL
Anesthesia: Monitor Anesthesia Care

## 2016-01-16 MED ORDER — PROPOFOL 10 MG/ML IV BOLUS
INTRAVENOUS | Status: AC
  Filled 2016-01-16: qty 20

## 2016-01-16 MED ORDER — LIDOCAINE 2% (20 MG/ML) 5 ML SYRINGE
INTRAMUSCULAR | Status: AC
  Filled 2016-01-16: qty 5

## 2016-01-16 MED ORDER — LACTATED RINGERS IV SOLN
INTRAVENOUS | Status: DC
  Administered 2016-01-16: 1000 mL via INTRAVENOUS

## 2016-01-16 MED ORDER — LIDOCAINE 2% (20 MG/ML) 5 ML SYRINGE
INTRAMUSCULAR | Status: DC | PRN
  Administered 2016-01-16: 100 mg via INTRAVENOUS

## 2016-01-16 MED ORDER — PROPOFOL 500 MG/50ML IV EMUL
INTRAVENOUS | Status: DC | PRN
  Administered 2016-01-16: 130 ug/kg/min via INTRAVENOUS

## 2016-01-16 MED ORDER — PROPOFOL 10 MG/ML IV BOLUS
INTRAVENOUS | Status: AC
  Filled 2016-01-16: qty 40

## 2016-01-16 MED ORDER — PROPOFOL 10 MG/ML IV BOLUS
INTRAVENOUS | Status: DC | PRN
  Administered 2016-01-16: 30 mg via INTRAVENOUS
  Administered 2016-01-16 (×2): 20 mg via INTRAVENOUS

## 2016-01-16 SURGICAL SUPPLY — 21 items

## 2016-01-16 NOTE — Transfer of Care (Signed)
Immediate Anesthesia Transfer of Care Note  Patient: Jeffery [name]  Procedure(s) Performed: Procedure(s): COLONOSCOPY WITH PROPOFOL (N/A)  Patient Location: PACU and Endoscopy Unit  Anesthesia Type:MAC  Level of Consciousness: awake, alert , oriented and patient cooperative  Airway & Oxygen Therapy: Patient Spontanous Breathing and Patient connected to face mask oxygen  Post-op Assessment: Report given to RN, Post -op Vital signs reviewed and stable and Patient moving all extremities  Post vital signs: Reviewed and stable  Last Vitals:  Vitals:   01/16/16 0659  BP: (!) 166/93  Pulse: 69  Resp: (!) 26  Temp: 36.7 C    Last Pain:  Vitals:   01/16/16 0659  TempSrc: Oral         Complications: No apparent anesthesia complications

## 2016-01-16 NOTE — H&P (Signed)
Procedure: Surveillance colonoscopy. 01/28/2011 colonoscopy was performed with removal of a 5 mm tubulovillous adenomatous rectal polyp  History: The patient is a 66 year old male born 11/28/1950. He is scheduled to undergo a surveillance colonoscopy today.  Past medical history: Rosacea. Hypercholesterolemia. Coronary artery disease. Drug eluting coronary artery stent placed in the LAD and RCA  in 2009. Laparoscopic cholecystectomy.  Exam: The patient is alert and lying comfortably on the endoscopy stretcher. Abdomen is soft and nontender to palpation. Lungs are clear to auscultation. Cardiac exam reveals a regular rhythm.  Plan: Proceed with surveillance colonoscopy

## 2016-01-16 NOTE — Op Note (Signed)
[facility] Patient Name: Jeffery [name] Procedure Date: 01/16/2016 MRN: [id number] Attending MD: Garlan Fair , MD Date of Birth: [birth year] CSN: [id number] Age: 66 Admit Type: Outpatient Procedure:                Colonoscopy Indications:              High risk colon cancer surveillance: Personal                            history of adenoma with villous component:                            01/28/2011 colonoscopy was performed with removal of                            a 5 mm tubulovillous adenomatous rectal polyp Providers:                Garlan Fair, MD, Laverta Baltimore RN, RN,                            William Dalton, Technician Referring MD:              Medicines:                Propofol per Anesthesia Complications:            No immediate complications. Estimated Blood Loss:     Estimated blood loss: none. Procedure:                Pre-Anesthesia Assessment:                           - Prior to the procedure, a History and Physical                            was performed, and patient medications and                            allergies were reviewed. The patient's tolerance of                            previous anesthesia was also reviewed. The risks                            and benefits of the procedure and the sedation                            options and risks were discussed with the patient.                            All questions were answered, and informed consent                            was obtained. Prior Anticoagulants: The patient has  taken aspirin, last dose was day of procedure. ASA                            Grade Assessment: II - A patient with mild systemic                            disease. After reviewing the risks and benefits,                            the patient was deemed in satisfactory condition to                            undergo the procedure.                           After obtaining  informed consent, the colonoscope                            was passed under direct vision. Throughout the                            procedure, the patient's blood pressure, pulse, and                            oxygen saturations were monitored continuously. The                            EC-[id number]) scope was introduced through                            the anus and advanced to the the cecum, identified                            by appendiceal orifice and ileocecal valve. The                            colonoscopy was performed without difficulty. The                            patient tolerated the procedure well. The quality                            of the bowel preparation was good. The terminal                            ileum, the ileocecal valve, the appendiceal orifice                            and the rectum were photographed. Scope In: 7:53:55 AM Scope Out: 8:15:15 AM Scope Withdrawal Time: 0 hours 10 minutes 53 seconds  Total Procedure Duration: 0 hours 21 minutes 20 seconds  Findings:      The perianal and digital rectal examinations were normal.      The entire examined colon  appeared normal. Impression:               - The entire examined colon is normal.                           - No specimens collected. Moderate Sedation:      N/A- Per Anesthesia Care Recommendation:           - Patient has a contact number available for                            emergencies. The signs and symptoms of potential                            delayed complications were discussed with the                            patient. Return to normal activities tomorrow.                            Written discharge instructions were provided to the                            patient.                           - Repeat colonoscopy in 5 years for surveillance.                           - Resume previous diet.                           - Continue present medications. Procedure Code(s):         --- Professional ---                           [id number], Colorectal cancer screening; colonoscopy on                            individual at high risk Diagnosis Code(s):        --- Professional ---                           Z86.010, Personal history of colonic polyps CPT copyright 2016 [employer]. All rights reserved. The codes documented in this report are preliminary and upon coder review may  be revised to meet current compliance requirements. Earle Gell, MD Garlan Fair, MD 01/16/2016 8:21:30 AM This report has been signed electronically. Number of Addenda: 0

## 2016-01-16 NOTE — Anesthesia Preprocedure Evaluation (Addendum)
Anesthesia Evaluation  Patient identified by MRN, date of birth, ID band Patient awake    Reviewed: Allergy & Precautions, H&P , NPO status , Patient's Chart, lab work & pertinent test results  Airway Mallampati: II   Neck ROM: full    Dental   Pulmonary former smoker,    breath sounds clear to auscultation       Cardiovascular hypertension, + CAD, + Past MI and + Cardiac Stents   Rhythm:regular Rate:Normal  Cath 2013: patent stents. EF 60%   Neuro/Psych    GI/Hepatic GERD  ,  Endo/Other    Renal/GU      Musculoskeletal   Abdominal   Peds  Hematology   Anesthesia Other Findings   Reproductive/Obstetrics                             Anesthesia Physical Anesthesia Plan  ASA: III  Anesthesia Plan: MAC   Post-op Pain Management:    Induction: Intravenous  Airway Management Planned: Simple Face Mask  Additional Equipment:   Intra-op Plan:   Post-operative Plan:   Informed Consent: I have reviewed the patients History and Physical, chart, labs and discussed the procedure including the risks, benefits and alternatives for the proposed anesthesia with the patient or authorized representative who has indicated his/her understanding and acceptance.     Plan Discussed with: CRNA, Anesthesiologist and Surgeon  Anesthesia Plan Comments:         Anesthesia Quick Evaluation

## 2016-01-16 NOTE — Anesthesia Postprocedure Evaluation (Signed)
Anesthesia Post Note  Patient: Jeffery [name]  Procedure(s) Performed: Procedure(s) (LRB): COLONOSCOPY WITH PROPOFOL (N/A)  Patient location during evaluation: PACU Anesthesia Type: MAC Level of consciousness: awake and alert Pain management: pain level controlled Vital Signs Assessment: post-procedure vital signs reviewed and stable Respiratory status: spontaneous breathing, nonlabored ventilation, respiratory function stable and patient connected to nasal cannula oxygen Cardiovascular status: stable and blood pressure returned to baseline Anesthetic complications: no       Last Vitals:  Vitals:   01/16/16 0830 01/16/16 0840  BP: (!) 146/76 (!) 161/71  Pulse: 60 60  Resp: 20 13  Temp:      Last Pain:  Vitals:   01/16/16 0659  TempSrc: Oral                 [name] S

## 2016-01-16 NOTE — Discharge Instructions (Signed)

## 2016-01-17 ENCOUNTER — Ambulatory Visit (INDEPENDENT_AMBULATORY_CARE_PROVIDER_SITE_OTHER): Payer: BLUE CROSS/BLUE SHIELD

## 2016-01-17 ENCOUNTER — Encounter: Payer: Self-pay | Admitting: Podiatry

## 2016-01-17 ENCOUNTER — Ambulatory Visit (INDEPENDENT_AMBULATORY_CARE_PROVIDER_SITE_OTHER): Payer: BLUE CROSS/BLUE SHIELD | Admitting: Podiatry

## 2016-01-17 VITALS — BP 139/77 | HR 64 | Resp 16

## 2016-01-17 DIAGNOSIS — M21619 Bunion of unspecified foot: Secondary | ICD-10-CM

## 2016-01-17 DIAGNOSIS — M205X2 Other deformities of toe(s) (acquired), left foot: Secondary | ICD-10-CM

## 2016-01-17 NOTE — Progress Notes (Signed)
Subjective:     Patient ID: Jeffery [name], male   DOB: [birth year], 66 y.o.   MRN: [id number]  HPI patient states she's doing real well with his left foot with minimal discomfort and still some swelling but overall good   Review of Systems     Objective:   Physical Exam Neurovascular status intact with first MPJ left that's healing well with wound edges well coapted good alignment and good range of motion with no crepitus    Assessment:     Doing well post osteotomy first metatarsal left with mild to moderate edema but overall healing well    Plan:     H&P x-ray reviewed and instructed on gradual increase in activities return to soft shoes with return to work next week hopefully  X-ray report indicates pins are in place joint is open spurring has been removed

## 2016-01-19 ENCOUNTER — Encounter (HOSPITAL_COMMUNITY): Payer: Self-pay | Admitting: Gastroenterology

## 2016-02-26 ENCOUNTER — Ambulatory Visit (INDEPENDENT_AMBULATORY_CARE_PROVIDER_SITE_OTHER): Payer: BLUE CROSS/BLUE SHIELD | Admitting: Podiatry

## 2016-02-26 ENCOUNTER — Ambulatory Visit (INDEPENDENT_AMBULATORY_CARE_PROVIDER_SITE_OTHER): Payer: BLUE CROSS/BLUE SHIELD

## 2016-02-26 DIAGNOSIS — M21619 Bunion of unspecified foot: Secondary | ICD-10-CM | POA: Diagnosis not present

## 2016-02-26 DIAGNOSIS — M205X2 Other deformities of toe(s) (acquired), left foot: Secondary | ICD-10-CM

## 2016-02-27 NOTE — Progress Notes (Signed)
Subjective: Jeffery [name] is a 66 y.o. is seen today in office s/p bi-plantar austin bunionectomy on the left preformed on 12/19/15 with Dr. Paulla Dolly. He states that he is not having any pain to the surgical site. He does state that he still has not been able to run much but he feels that he is getting good movement to the toe.  Denies any systemic complaints such as fevers, chills, nausea, vomiting. No calf pain, chest pain, shortness of breath.   Objective: General: No acute distress, AAOx3  DP/PT pulses palpable 2/4, CRT < 3 sec to all digits.  Protective sensation intact. Motor function intact.  Right foot: Incision is well coapted without any evidence of dehiscence and a scar is well formed. There is no surrounding erythema, ascending cellulitis, fluctuance, crepitus, malodor, drainage/purulence. There is mild edema around the surgical site. There is no pain along the surgical site. MTPJ ROM is intact in dorsiflexion but somewhat limited in plantar flexion.  No other areas of tenderness to bilateral lower extremities.  No other open lesions or pre-ulcerative lesions.  No pain with calf compression, swelling, warmth, erythema.   Assessment and Plan:  Status post left bunion surgery  -Treatment options discussed including all alternatives, risks, and complications -X-rays were obtained and reviewed with the patient. Hardware intact. There is decrease in the 1st MTPJ joint space.  -Ice/elevation as needed -I showed him ROM exercises for him to continue at home.  -Monitor for any clinical signs or symptoms of infection and DVT/PE and directed to call the office immediately should any occur or go to the ER. -Follow-up in 4 weeks with Dr Paulla Dolly if he is having any issues or sooner if any problems arise. In the meantime, encouraged to call the office with any questions, concerns, change in symptoms.   Celesta Gentile, DPM

## 2016-03-18 ENCOUNTER — Other Ambulatory Visit: Payer: Self-pay | Admitting: Cardiology

## 2016-03-25 ENCOUNTER — Ambulatory Visit (INDEPENDENT_AMBULATORY_CARE_PROVIDER_SITE_OTHER): Payer: Self-pay | Admitting: Podiatry

## 2016-03-25 ENCOUNTER — Other Ambulatory Visit: Payer: Self-pay

## 2016-03-25 ENCOUNTER — Ambulatory Visit (INDEPENDENT_AMBULATORY_CARE_PROVIDER_SITE_OTHER): Payer: BLUE CROSS/BLUE SHIELD

## 2016-03-25 ENCOUNTER — Encounter: Payer: Self-pay | Admitting: Podiatry

## 2016-03-25 DIAGNOSIS — M21612 Bunion of left foot: Secondary | ICD-10-CM

## 2016-03-25 DIAGNOSIS — M205X2 Other deformities of toe(s) (acquired), left foot: Secondary | ICD-10-CM

## 2016-03-25 MED ORDER — ATORVASTATIN CALCIUM 80 MG PO TABS: 80.0000 mg | ORAL_TABLET | Freq: Every day | ORAL | 0 refills | Status: DC

## 2016-03-27 NOTE — Progress Notes (Signed)
Subjective:     Patient ID: Jeffery [name], male   DOB: 05-05-1950, 66 y.o.   MRN: [id number]  HPI patient presents stating that the left big toe joint is doing really good with minimal discomfort and still swells some and hurts if he's on a long time   Review of Systems     Objective:   Physical Exam Neurovascular status intact with first MPJ left showing good range of motion no crepitus with well-healed surgical site with wound edges well coapted    Assessment:     Doing well post osteotomy first metatarsal left    Plan:     Reviewed x-rays and discussed there is some moderate chronic arthritis and the importance of orthotics for him long-term. Reappoint 6 date weeks or earlier if needed  X-rays indicate spur 7 properly remove pins are in place bone is healing well with moderate arthritis of the joint surface

## 2016-04-04 ENCOUNTER — Telehealth: Payer: Self-pay | Admitting: Cardiology

## 2016-04-04 NOTE — Telephone Encounter (Signed)
No need for SBE prophylaxis; pt should not hold ASA. Kirk Ruths

## 2016-04-04 NOTE — Telephone Encounter (Signed)
1. What dental office are you calling from? Dr Geradine Girt  2. What is your office phone and fax number? [phone] and fax is [phone]   3. What type of procedure is the patient having performed? Dental Extraction What date is procedure scheduled? 04-15-16   4. What is your question (ex. Antibiotics prior to procedure, holding medication-we need to know how long dentist wants pt to hold med)? Can pt stop his Aspirin? If so when and when can pt start back on Aspirin?

## 2016-04-04 NOTE — Telephone Encounter (Signed)
Pt notified that dr Stanford Breed is off and will not be in the office until Monday.

## 2016-04-04 NOTE — Telephone Encounter (Signed)
Lm2cb 

## 2016-04-04 NOTE — Telephone Encounter (Signed)
Pt said would you please call his dentist office today before 3:30 and let them know about stopping his medicine When they leave today,they will not be back in the office until Tuesday please.He needs to know something asap please.

## 2016-04-04 NOTE — Telephone Encounter (Signed)
Faxed via epic to Dr Bobby Rumpf' office

## 2016-04-10 ENCOUNTER — Telehealth: Payer: Self-pay | Admitting: Cardiology

## 2016-04-10 DIAGNOSIS — I10 Essential (primary) hypertension: Secondary | ICD-10-CM

## 2016-04-10 DIAGNOSIS — E782 Mixed hyperlipidemia: Secondary | ICD-10-CM

## 2016-04-10 DIAGNOSIS — I249 Acute ischemic heart disease, unspecified: Secondary | ICD-10-CM

## 2016-04-10 DIAGNOSIS — I2510 Atherosclerotic heart disease of native coronary artery without angina pectoris: Secondary | ICD-10-CM

## 2016-04-10 NOTE — Telephone Encounter (Signed)
Returned call, appt for labs moved to 4/10 at patient request. No further needs ID'd at this time, pt voiced thanks.

## 2016-04-10 NOTE — Telephone Encounter (Signed)
Ok with me to obtain fasting lipid lab prior to visit. Make sure it is fasting. Last lipid lab was in 11/2014

## 2016-04-10 NOTE — Telephone Encounter (Signed)
Follow up     Returning Kennard call about the changes he made

## 2016-04-10 NOTE — Telephone Encounter (Signed)
Goes to VM. Left msg to call. 

## 2016-04-10 NOTE — Telephone Encounter (Signed)
Spoke w patient, instructed per Dr. Jacalyn Lefevre advice in last telephone - note no interruption to ASA, no SBE criteria  Spoke w receptionist at Dr. Bobby Rumpf' office and faxed last telephone note to them. Requested them to call back if unreceived.

## 2016-04-10 NOTE — Telephone Encounter (Signed)
Discussed w [name] - OK for liver function test as well, BMET and CBC if not done in last year by PCP  I called patient, he does not recall last PCP OV or if labwork was drawn. Has been more than 1 year. Pt aware lab orders created, he may go to [place] office next Monday for fasting draw.

## 2016-04-10 NOTE — Telephone Encounter (Signed)
Jeffery [name] is returning your call  [name]. Thanks

## 2016-04-10 NOTE — Telephone Encounter (Signed)
New message     1. What dental office are you calling from?  Dr  Joya Gaskins in [place]   2. What is your office phone and fax number? Fax [phone]  Office [phone]  3. What type of procedure is the patient having performed? Dental extraction   4. What date is procedure scheduled? Monday 04/15/16  5. What is your question (ex. Antibiotics prior to procedure, holding medication-we need to know how long dentist wants pt to hold med)?  The dentist wants him to stop the aspirin on Friday or have you fax note saying that he should not stop it.

## 2016-04-10 NOTE — Telephone Encounter (Signed)
Patient had 2nd inquiry. 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 He's asking if he can have orders for previsit labwork to be done on the 9th (will see Hao on 16th.). He states he's due for a fasting cholesterol test and would like to see if this can be arranged -- requested to schedule this at the [facility] office.  Recommendation sought for previsit labs.

## 2016-04-15 ENCOUNTER — Other Ambulatory Visit: Payer: BLUE CROSS/BLUE SHIELD

## 2016-04-16 ENCOUNTER — Other Ambulatory Visit: Payer: BLUE CROSS/BLUE SHIELD | Admitting: *Deleted

## 2016-04-16 ENCOUNTER — Encounter (INDEPENDENT_AMBULATORY_CARE_PROVIDER_SITE_OTHER): Payer: Self-pay

## 2016-04-16 DIAGNOSIS — I2510 Atherosclerotic heart disease of native coronary artery without angina pectoris: Secondary | ICD-10-CM

## 2016-04-16 DIAGNOSIS — E7800 Pure hypercholesterolemia, unspecified: Secondary | ICD-10-CM

## 2016-04-16 DIAGNOSIS — I10 Essential (primary) hypertension: Secondary | ICD-10-CM

## 2016-04-16 LAB — LIPID PANEL
Chol/HDL Ratio: 2.1 ratio (ref 0.0–5.0)
Cholesterol, Total: 115 mg/dL (ref 100–199)
HDL: 55 mg/dL (ref >39)
LDL Calculated: 47 mg/dL (ref 0–99)
Triglycerides: 64 mg/dL (ref 0–149)
VLDL Cholesterol Cal: 13 mg/dL (ref 5–40)

## 2016-04-16 LAB — CBC WITH DIFFERENTIAL/PLATELET
Basophils Absolute: 0 10*3/uL (ref 0.0–0.2)
Basos: 1 %
EOS (ABSOLUTE): 0.3 10*3/uL (ref 0.0–0.4)
Eos: 5 %
Hematocrit: 40.9 % (ref 37.5–51.0)
Hemoglobin: 14 g/dL (ref 13.0–17.7)
Immature Grans (Abs): 0 10*3/uL (ref 0.0–0.1)
Immature Granulocytes: 0 %
Lymphocytes Absolute: 1.4 10*3/uL (ref 0.7–3.1)
Lymphs: 19 %
MCH: 31.8 pg (ref 26.6–33.0)
MCHC: 34.2 g/dL (ref 31.5–35.7)
MCV: 93 fL (ref 79–97)
Monocytes Absolute: 0.6 10*3/uL (ref 0.1–0.9)
Monocytes: 8 %
Neutrophils Absolute: 4.8 10*3/uL (ref 1.4–7.0)
Neutrophils: 67 %
Platelets: 205 10*3/uL (ref 150–379)
RBC: 4.4 x10E6/uL (ref 4.14–5.80)
RDW: 13 % (ref 12.3–15.4)
WBC: 7.1 10*3/uL (ref 3.4–10.8)

## 2016-04-16 LAB — BASIC METABOLIC PANEL
BUN/Creatinine Ratio: 10 (ref 10–24)
BUN: 10 mg/dL (ref 8–27)
CO2: 25 mmol/L (ref 18–29)
Calcium: 9.4 mg/dL (ref 8.6–10.2)
Chloride: 105 mmol/L (ref 96–106)
Creatinine, Ser: 0.97 mg/dL (ref 0.76–1.27)
GFR calc Af Amer: 94 mL/min/{1.73_m2} (ref >59)
GFR calc non Af Amer: 82 mL/min/{1.73_m2} (ref >59)
Glucose: 101 mg/dL — ABNORMAL HIGH (ref 65–99)
Potassium: 4.6 mmol/L (ref 3.5–5.2)
Sodium: 144 mmol/L (ref 134–144)

## 2016-04-16 LAB — HEPATIC FUNCTION PANEL
ALT: 20 IU/L (ref 0–44)
AST: 22 IU/L (ref 0–40)
Albumin: 4.1 g/dL (ref 3.6–4.8)
Alkaline Phosphatase: 73 IU/L (ref 39–117)
Bilirubin Total: 0.4 mg/dL (ref 0.0–1.2)
Bilirubin, Direct: 0.1 mg/dL (ref 0.00–0.40)
Total Protein: 6.3 g/dL (ref 6.0–8.5)

## 2016-04-16 NOTE — Addendum Note (Signed)
Addended by: Eulis Foster on: 04/16/2016 07:32 AM   Modules accepted: Orders

## 2016-04-22 ENCOUNTER — Encounter: Payer: Self-pay | Admitting: Physician Assistant

## 2016-04-22 ENCOUNTER — Ambulatory Visit (INDEPENDENT_AMBULATORY_CARE_PROVIDER_SITE_OTHER): Payer: BLUE CROSS/BLUE SHIELD | Admitting: Physician Assistant

## 2016-04-22 VITALS — BP 172/83 | HR 53 | Ht 69.5 in | Wt 256.0 lb

## 2016-04-22 DIAGNOSIS — E785 Hyperlipidemia, unspecified: Secondary | ICD-10-CM

## 2016-04-22 DIAGNOSIS — I2510 Atherosclerotic heart disease of native coronary artery without angina pectoris: Secondary | ICD-10-CM

## 2016-04-22 DIAGNOSIS — I10 Essential (primary) hypertension: Secondary | ICD-10-CM

## 2016-04-22 NOTE — Patient Instructions (Signed)
Medication Instructions: no changes   Labwork: none   Testing/Procedures: none   Follow-Up: 1 yr with Dr. Stanford Breed   Any Other Special Instructions Will Be Listed Below (If Applicable).  Follow your blood pressure at home. We will give you a call in 1 week to discuss this and any potential medication changes. If you do not hear [place], please call.   If you need a refill on your cardiac medications before your next appointment, please call your pharmacy.
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
## 2016-04-23 ENCOUNTER — Encounter: Payer: Self-pay | Admitting: Physician Assistant

## 2016-05-10 ENCOUNTER — Other Ambulatory Visit: Payer: Self-pay | Admitting: Physician Assistant

## 2016-05-10 ENCOUNTER — Telehealth: Payer: Self-pay | Admitting: Physician Assistant

## 2016-05-10 MED ORDER — AMLODIPINE BESYLATE 5 MG PO TABS: 5.0000 mg | ORAL_TABLET | Freq: Every day | ORAL | 3 refills | Status: DC

## 2016-05-10 NOTE — Telephone Encounter (Signed)
Staff message sent to NL scheduling pool to contact patient to arrange appt.

## 2016-05-10 NOTE — Telephone Encounter (Signed)
Called the patient, his BP still high, SBP typically running in the 140-150s. No discomfort. Will add amlodipine 5mg  every night. He takes losartan and coreg in AM and coreg at night. Unable to increase coreg due to baseline bradycardia.   Patient will start on 5mg  daily amlodipine. I will forward this message to our office staff and HTN clinic, patient will need to be seen in hypertension clinic in 2-3 weeks. Once BP at goal, he can followup with Dr. Stanford Breed around Apr of 2019 (1 year).   Jeffery Corrigan PA Pager: [phone]

## 2016-05-10 NOTE — Telephone Encounter (Signed)
talked to patient and schedule appointment with HTN clinic for 05/20/16.  Pateint only able to come Mondays to clinic visits.

## 2016-05-20 ENCOUNTER — Ambulatory Visit: Payer: BLUE CROSS/BLUE SHIELD
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
## 2016-05-27 ENCOUNTER — Ambulatory Visit (INDEPENDENT_AMBULATORY_CARE_PROVIDER_SITE_OTHER): Payer: BLUE CROSS/BLUE SHIELD | Admitting: Podiatry

## 2016-05-27 ENCOUNTER — Ambulatory Visit (INDEPENDENT_AMBULATORY_CARE_PROVIDER_SITE_OTHER): Payer: BLUE CROSS/BLUE SHIELD

## 2016-05-27 DIAGNOSIS — Z9889 Other specified postprocedural states: Secondary | ICD-10-CM

## 2016-05-27 DIAGNOSIS — M205X2 Other deformities of toe(s) (acquired), left foot: Secondary | ICD-10-CM

## 2016-05-27 DIAGNOSIS — M21612 Bunion of left foot: Secondary | ICD-10-CM

## 2016-05-28 NOTE — Progress Notes (Signed)
Subjective: Patient presents today status post bunionectomy to the left foot performed by Dr. Paulla Dolly. Patient states he is doing well.  Objective: Incision site is well coapted with sutures intact.negative for edema and ecchymosis noted. No sign of infectious process.  Radiographic Exam:  Orthopedic hardware and osteotomies sites appear to be stable with routine healing.  Assessment: Status post left bunionectomy. Healed.  Plan of care: Patient was evaluated. X-rays reviewed. Return to clinic when necessary.

## 2016-06-22 ENCOUNTER — Other Ambulatory Visit: Payer: Self-pay | Admitting: Cardiology

## 2016-08-21 ENCOUNTER — Other Ambulatory Visit: Payer: Self-pay | Admitting: Cardiology

## 2016-10-11 ENCOUNTER — Other Ambulatory Visit: Payer: Self-pay | Admitting: Family Medicine

## 2016-10-11 DIAGNOSIS — R1900 Intra-abdominal and pelvic swelling, mass and lump, unspecified site: Secondary | ICD-10-CM

## 2016-11-04 ENCOUNTER — Ambulatory Visit
Admission: RE | Admit: 2016-11-04 | Discharge: 2016-11-04 | Disposition: A | Discharge status: Home / Self Care | Payer: Worker's Compensation | Source: Ambulatory Visit | Attending: Family Medicine | Admitting: Family Medicine

## 2016-11-04 DIAGNOSIS — R1900 Intra-abdominal and pelvic swelling, mass and lump, unspecified site: Secondary | ICD-10-CM

## 2016-11-04 DIAGNOSIS — M1610 Unilateral primary osteoarthritis, unspecified hip: Secondary | ICD-10-CM

## 2016-11-04 DIAGNOSIS — K5730 Diverticulosis of large intestine without perforation or abscess without bleeding: Secondary | ICD-10-CM

## 2016-11-04 DIAGNOSIS — K458 Other specified abdominal hernia without obstruction or gangrene: Secondary | ICD-10-CM

## 2016-11-04 DIAGNOSIS — M4306 Spondylolysis, lumbar region: Secondary | ICD-10-CM

## 2016-11-04 DIAGNOSIS — M5136 Other intervertebral disc degeneration, lumbar region: Secondary | ICD-10-CM

## 2016-11-04 DIAGNOSIS — I700 Atherosclerosis of aorta: Secondary | ICD-10-CM

## 2016-11-04 DIAGNOSIS — M51369 Other intervertebral disc degeneration, lumbar region without mention of lumbar back pain or lower extremity pain: Secondary | ICD-10-CM

## 2016-11-04 DIAGNOSIS — K7689 Other specified diseases of liver: Secondary | ICD-10-CM

## 2016-11-04 HISTORY — DX: Unilateral primary osteoarthritis, unspecified hip: M16.10

## 2016-11-04 HISTORY — DX: Spondylolysis, lumbar region: M43.06

## 2016-11-04 HISTORY — DX: Other specified diseases of liver: K76.89

## 2016-11-04 HISTORY — DX: Other specified abdominal hernia without obstruction or gangrene: K45.8

## 2016-11-04 HISTORY — DX: Other intervertebral disc degeneration, lumbar region: M51.36

## 2016-11-04 HISTORY — DX: Diverticulosis of large intestine without perforation or abscess without bleeding: K57.30

## 2016-11-04 HISTORY — DX: Other intervertebral disc degeneration, lumbar region without mention of lumbar back pain or lower extremity pain: M51.369

## 2016-11-04 HISTORY — DX: Atherosclerosis of aorta: I70.0

## 2016-11-04 MED ORDER — IOPAMIDOL (ISOVUE-300) INJECTION 61%
100.0000 mL | Freq: Once | INTRAVENOUS | Status: AC | PRN
  Administered 2016-11-04: 100 mL via INTRAVENOUS

## 2016-12-09 ENCOUNTER — Ambulatory Visit: Payer: Self-pay | Admitting: Surgery

## 2016-12-10 ENCOUNTER — Telehealth: Payer: Self-pay

## 2016-12-10 NOTE — Telephone Encounter (Signed)
See below. Per Melina Copa, called patient and arrange pre-op evaluation with Dr. Stanford Breed next Thursday per patient request.   Will forward to Dr. Johney Maine as Juluis Rainier. Fax: [phone]     Chart reviewed as part of pre-operative protocol coverage.  I believe this request was put under wrong MRN because there is no other information listed in this patient's chart. There is a Joyice Faster Barley with same birthdate who follows with Dr. Stanford Breed under MRN [id number]. This MRN also includes a pre-op encounter from Dr. Johney Maine so I believe it to be accurate.  At last OV in 04/2016 with [name], close f/u was recommended to make sure patient's BP was controlled but I do not see this occurred. Because of Fabricio L Canniff's past medical history and time since last visit (>6 months), he will require a follow-up visit in order to better assess preoperative cardiovascular risk.  Pre-op covering staff: - Please verify that this patient is in fact the patient with above MRN having this specific surgery. - Please copy this note into their chart - Please schedule appointment and call patient to inform them. - Please contact requesting surgeon's office via preferred method (i.e, phone, fax) to inform them of need for appointment prior to surgery.  Charlie Pitter, PA-C  12/10/2016, 3:56 PM         Documentation     Pugh, Lucianne Muss D, LPN routed conversation to [facility] Preop 4 hours ago (11:47 AM)    Delma Freeze, Lucianne Muss D, LPN 4 hours ago (29:93 AM)        [facility] Pre-operative Risk Assessment    Request for surgical clearance:  1. What type of surgery is being performed -lap ventral wall hernia repair  2. When is this surgery scheduled - pending  3. Are there any medications that need to be held prior to surgery and how long - none  4. Practice name and name of physician performing surgery [facility]  5. What is your office phone and  fax number - Phone # [phone]  Fax # [phone]  6. Anesthesia type - general   Kathyrn Lass 12/10/2016, 11:44 AM

## 2016-12-10 NOTE — Telephone Encounter (Signed)
   Chart reviewed as part of pre-operative protocol coverage.  I believe this request was put under wrong MRN because there is no other information listed in this patient's chart. There is a [name] with same birthdate who follows with Dr. Stanford Breed under MRN [id number]. This MRN also includes a pre-op encounter from Dr. Johney Maine so I believe it to be accurate.  At last OV in 04/2016 with [name], close f/u was recommended to make sure patient's BP was controlled but I do not see this occurred. Because of Jeffery [name]'s past medical history and time since last visit (>6 months), he will require a follow-up visit in order to better assess preoperative cardiovascular risk.  Pre-op covering staff: - Please verify that this patient is in fact the patient with above MRN having this specific surgery. - Please copy this note into their chart - Please schedule appointment and call patient to inform them. - Please contact requesting surgeon's office via preferred method (i.e, phone, fax) to inform them of need for appointment prior to surgery.  Charlie Pitter, PA-C  12/10/2016, 3:56 PM

## 2016-12-10 NOTE — Telephone Encounter (Signed)
This was created in error.  Patient chart corrected.

## 2016-12-10 NOTE — Telephone Encounter (Signed)
   [facility] Pre-operative Risk Assessment    Request for surgical clearance:  1. What type of surgery is being performed -lap ventral wall hernia repair  2. When is this surgery scheduled - pending  3. Are there any medications that need to be held prior to surgery and how long - none  4. Practice name and name of physician performing surgery [facility] [place] [facility]  5. What is your office phone and fax number - Phone # [phone]  Fax # [phone]  6. Anesthesia type - general   Kathyrn Lass 12/10/2016, 11:44 AM  _________________________________________________________________   (provider comments below)

## 2016-12-12 ENCOUNTER — Telehealth: Payer: Self-pay | Admitting: Cardiology

## 2016-12-12 NOTE — Telephone Encounter (Signed)
°  New Prob   Pt calling regarding his upcoming appointment on 12/19/16. States this appointment is for a [employer] comp claim. Pt calling to verify this is okay. Please call.

## 2016-12-12 NOTE — Telephone Encounter (Signed)
Spoke with pt, he reports they called yesterday and spoke with pam in our office and we should have all the information regarding his workers comp. Aware everything will be fine for the appointment.
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
## 2016-12-19 ENCOUNTER — Ambulatory Visit (INDEPENDENT_AMBULATORY_CARE_PROVIDER_SITE_OTHER): Payer: Worker's Compensation | Admitting: Cardiology

## 2016-12-19 ENCOUNTER — Encounter: Payer: Self-pay | Admitting: Cardiology

## 2016-12-19 VITALS — BP 146/84 | HR 85 | Ht 69.0 in | Wt 253.0 lb

## 2016-12-19 DIAGNOSIS — Z01810 Encounter for preprocedural cardiovascular examination: Secondary | ICD-10-CM

## 2016-12-19 DIAGNOSIS — I2510 Atherosclerotic heart disease of native coronary artery without angina pectoris: Secondary | ICD-10-CM | POA: Diagnosis not present

## 2016-12-19 DIAGNOSIS — I10 Essential (primary) hypertension: Secondary | ICD-10-CM

## 2016-12-19 DIAGNOSIS — E7800 Pure hypercholesterolemia, unspecified: Secondary | ICD-10-CM | POA: Diagnosis not present

## 2016-12-19 MED ORDER — CARVEDILOL 12.5 MG PO TABS: 12.5000 mg | ORAL_TABLET | Freq: Two times a day (BID) | ORAL | 3 refills | Status: DC

## 2016-12-19 NOTE — Patient Instructions (Signed)
Medication Instructions:   INCREASE CARVEDILOL TO 12.5 MG TWICE DAILY= 2 OF THERE 6.25 MG TABLETS TWICE DAILY  Follow-Up:  Your physician wants you to follow-up in: [place] will receive a reminder letter in the mail two months in advance. If you don't receive a letter, please call our office to schedule the follow-up appointment.   If you need a refill on your cardiac medications before your next appointment, please call your pharmacy.

## 2016-12-29 ENCOUNTER — Other Ambulatory Visit: Payer: Self-pay | Admitting: Cardiology

## 2016-12-30 NOTE — Telephone Encounter (Signed)
REFILL 

## 2017-01-24 ENCOUNTER — Encounter (HOSPITAL_COMMUNITY): Payer: Self-pay

## 2017-01-24 NOTE — Patient Instructions (Addendum)
Your procedure is scheduled on: Thursday, Jan. 24, 2019   Surgery Time:  11:15AM-2:15PM   Report to [facility] Main  Entrance    Report to admitting at 9:15 AM   Call this number if you have problems the morning of surgery [phone]   Do not eat food or drink liquids :After Midnight.   Do NOT smoke after Midnight   Drink 2 Ensure drinks the night before surgery at bedtime.  Complete one Ensure drink the morning of surgery 3 hours prior to  scheduled surgery (by 8:15 AM)   Take these medicines the morning of surgery with A SIP OF WATER: Carvedilol, Colace, Pantoprazole              You may not have any metal on your body including jewelry, and body piercings             Do not wear lotions, powders, perfumes/cologne, or deodorant             Men may shave face and neck.   Do not bring valuables to the hospital. [place].   Contacts, dentures or bridgework may not be worn into surgery.   Leave suitcase in the car. After surgery it may be brought to your room.   Special Instructions: Bring a copy of your healthcare power of attorney and living will documents         the day of surgery if you haven't scanned them in before.              Please read over the following fact sheets you were given:   [facility] - Preparing for Surgery Before surgery, you can play an important role.  Because skin is not sterile, your skin needs to be as free of germs as possible.  You can reduce the number of germs on your skin by washing with CHG (chlorahexidine gluconate) soap before surgery.  CHG is an antiseptic cleaner which kills germs and bonds with the skin to continue killing germs even after washing. Please DO NOT use if you have an allergy to CHG or antibacterial soaps.  If your skin becomes reddened/irritated stop using the CHG and inform your nurse when you arrive at Short Stay. Do not shave (including legs and underarms)  for at least 48 hours prior to the first CHG shower.  You may shave your face/neck.  Please follow these instructions carefully:  1.  Shower with CHG Soap the night before surgery and the  morning of surgery.  2.  If you choose to wash your hair, wash your hair first as usual with your normal  shampoo.  3.  After you shampoo, rinse your hair and body thoroughly to remove the shampoo.                             4.  Use CHG as you would any other liquid soap.  You can apply chg directly to the skin and wash.  Gently with a scrungie or clean washcloth.  5.  Apply the CHG Soap to your body ONLY FROM THE NECK DOWN.   Do   not use on face/ open  Wound or open sores. Avoid contact with eyes, ears mouth and   genitals (private parts).                       Wash face,  Genitals (private parts) with your normal soap.             6.  Wash thoroughly, paying special attention to the area where your    surgery  will be performed.  7.  Thoroughly rinse your body with warm water from the neck down.  8.  DO NOT shower/wash with your normal soap after using and rinsing off the CHG Soap.                9.  Pat yourself dry with a clean towel.            10.  Wear clean pajamas.            11.  Place clean sheets on your bed the night of your first shower and do not  sleep with pets. Day of Surgery : Do not apply any lotions/deodorants the morning of surgery.  Please wear clean clothes to the hospital/surgery center.  FAILURE TO FOLLOW THESE INSTRUCTIONS MAY RESULT IN THE CANCELLATION OF YOUR SURGERY  PATIENT SIGNATURE_________________________________  NURSE SIGNATURE__________________________________  ________________________________________________________________________

## 2017-01-24 NOTE — Pre-Procedure Instructions (Addendum)
The following are in epic: Last office note and cardiac clearance from Dr. Stanford Breed 12/19/16  EKG 12/19/16

## 2017-01-27 ENCOUNTER — Encounter (INDEPENDENT_AMBULATORY_CARE_PROVIDER_SITE_OTHER): Payer: Self-pay

## 2017-01-27 ENCOUNTER — Encounter (HOSPITAL_COMMUNITY): Payer: Self-pay

## 2017-01-27 ENCOUNTER — Encounter (HOSPITAL_COMMUNITY)
Admission: RE | Admit: 2017-01-27 | Discharge: 2017-01-27 | Disposition: A | Discharge status: Home / Self Care | Payer: Worker's Compensation | Source: Ambulatory Visit | Attending: Surgery | Admitting: Surgery

## 2017-01-27 ENCOUNTER — Other Ambulatory Visit: Payer: Self-pay

## 2017-01-27 DIAGNOSIS — M1990 Unspecified osteoarthritis, unspecified site: Secondary | ICD-10-CM | POA: Diagnosis not present

## 2017-01-27 DIAGNOSIS — Z955 Presence of coronary angioplasty implant and graft: Secondary | ICD-10-CM | POA: Diagnosis not present

## 2017-01-27 DIAGNOSIS — E782 Mixed hyperlipidemia: Secondary | ICD-10-CM | POA: Diagnosis not present

## 2017-01-27 DIAGNOSIS — I252 Old myocardial infarction: Secondary | ICD-10-CM | POA: Diagnosis not present

## 2017-01-27 DIAGNOSIS — Z6837 Body mass index (BMI) 37.0-37.9, adult: Secondary | ICD-10-CM | POA: Diagnosis not present

## 2017-01-27 DIAGNOSIS — K430 Incisional hernia with obstruction, without gangrene: Secondary | ICD-10-CM | POA: Diagnosis not present

## 2017-01-27 DIAGNOSIS — C61 Malignant neoplasm of prostate: Secondary | ICD-10-CM | POA: Diagnosis not present

## 2017-01-27 DIAGNOSIS — Z87891 Personal history of nicotine dependence: Secondary | ICD-10-CM | POA: Diagnosis not present

## 2017-01-27 DIAGNOSIS — Z7982 Long term (current) use of aspirin: Secondary | ICD-10-CM | POA: Diagnosis not present

## 2017-01-27 DIAGNOSIS — I2510 Atherosclerotic heart disease of native coronary artery without angina pectoris: Secondary | ICD-10-CM | POA: Diagnosis not present

## 2017-01-27 DIAGNOSIS — K219 Gastro-esophageal reflux disease without esophagitis: Secondary | ICD-10-CM | POA: Diagnosis not present

## 2017-01-27 DIAGNOSIS — Z79899 Other long term (current) drug therapy: Secondary | ICD-10-CM | POA: Diagnosis not present

## 2017-01-27 DIAGNOSIS — I10 Essential (primary) hypertension: Secondary | ICD-10-CM | POA: Diagnosis not present

## 2017-01-27 HISTORY — DX: Diverticulosis of large intestine without perforation or abscess without bleeding: K57.30

## 2017-01-27 HISTORY — DX: Spondylolysis, lumbar region: M43.06

## 2017-01-27 HISTORY — DX: Other specified abdominal hernia without obstruction or gangrene: K45.8

## 2017-01-27 HISTORY — DX: Other intervertebral disc degeneration, lumbar region: M51.36

## 2017-01-27 HISTORY — DX: Other specified diseases of liver: K76.89

## 2017-01-27 HISTORY — DX: Unilateral primary osteoarthritis, unspecified hip: M16.10

## 2017-01-27 HISTORY — DX: Atherosclerosis of aorta: I70.0

## 2017-01-27 LAB — CBC
HCT: 42.8 % (ref 39.0–52.0)
Hemoglobin: 14.3 g/dL (ref 13.0–17.0)
MCH: 31.8 pg (ref 26.0–34.0)
MCHC: 33.4 g/dL (ref 30.0–36.0)
MCV: 95.3 fL (ref 78.0–100.0)
Platelets: 209 10*3/uL (ref 150–400)
RBC: 4.49 MIL/uL (ref 4.22–5.81)
RDW: 12.6 % (ref 11.5–15.5)
WBC: 8.6 10*3/uL (ref 4.0–10.5)

## 2017-01-27 LAB — BASIC METABOLIC PANEL
Anion gap: 6 (ref 5–15)
BUN: 14 mg/dL (ref 6–20)
CO2: 29 mmol/L (ref 22–32)
Calcium: 9.8 mg/dL (ref 8.9–10.3)
Chloride: 105 mmol/L (ref 101–111)
Creatinine, Ser: 0.96 mg/dL (ref 0.61–1.24)
GFR calc Af Amer: >60 mL/min (ref >60)
GFR calc non Af Amer: >60 mL/min (ref >60)
Glucose, Bld: 104 mg/dL — ABNORMAL HIGH (ref 65–99)
Potassium: 5.4 mmol/L — ABNORMAL HIGH (ref 3.5–5.1)
Sodium: 140 mmol/L (ref 135–145)

## 2017-01-27 NOTE — Pre-Procedure Instructions (Signed)
BMP results 01/27/17 Dr. Ambrose Pancoast made aware, no new orders at this time, also faxed via epic to Dr. Johney Maine.

## 2017-01-28 NOTE — Progress Notes (Signed)
I have reviewed the tests.  There are no major concerns.  Okay to proceed with surgery from my standpoint. If PAT & anesthesia still have concerns, they can d/w the patient's PCP

## 2017-01-29 MED ORDER — BUPIVACAINE LIPOSOME 1.3 % IJ SUSP
20.0000 mL | INTRAMUSCULAR | Status: DC
  Filled 2017-01-29 (×2): qty 20

## 2017-01-29 MED ORDER — CEFAZOLIN SODIUM 10 G IJ SOLR
3.0000 g | INTRAMUSCULAR | Status: AC
  Filled 2017-01-29: qty 3000

## 2017-01-30 ENCOUNTER — Encounter (HOSPITAL_COMMUNITY): Payer: Self-pay | Admitting: *Deleted

## 2017-01-30 ENCOUNTER — Ambulatory Visit (HOSPITAL_COMMUNITY): Payer: Worker's Compensation | Admitting: Anesthesiology

## 2017-01-30 ENCOUNTER — Other Ambulatory Visit: Payer: Self-pay

## 2017-01-30 ENCOUNTER — Encounter (HOSPITAL_COMMUNITY)
Admission: RE | Disposition: A | Discharge status: Home / Self Care | Payer: Self-pay | Source: Ambulatory Visit | Attending: Surgery

## 2017-01-30 ENCOUNTER — Observation Stay (HOSPITAL_COMMUNITY)
Admission: RE | Admit: 2017-01-30 | Discharge: 2017-01-31 | Disposition: A | Discharge status: Home / Self Care | Payer: Worker's Compensation | Source: Ambulatory Visit | Attending: Surgery | Admitting: Surgery

## 2017-01-30 DIAGNOSIS — E782 Mixed hyperlipidemia: Secondary | ICD-10-CM | POA: Insufficient documentation

## 2017-01-30 DIAGNOSIS — Z955 Presence of coronary angioplasty implant and graft: Secondary | ICD-10-CM | POA: Diagnosis not present

## 2017-01-30 DIAGNOSIS — K458 Other specified abdominal hernia without obstruction or gangrene: Secondary | ICD-10-CM

## 2017-01-30 DIAGNOSIS — I252 Old myocardial infarction: Secondary | ICD-10-CM | POA: Insufficient documentation

## 2017-01-30 DIAGNOSIS — Z87891 Personal history of nicotine dependence: Secondary | ICD-10-CM | POA: Insufficient documentation

## 2017-01-30 DIAGNOSIS — K430 Incisional hernia with obstruction, without gangrene: Principal | ICD-10-CM | POA: Insufficient documentation

## 2017-01-30 DIAGNOSIS — I2510 Atherosclerotic heart disease of native coronary artery without angina pectoris: Secondary | ICD-10-CM | POA: Diagnosis not present

## 2017-01-30 DIAGNOSIS — K219 Gastro-esophageal reflux disease without esophagitis: Secondary | ICD-10-CM | POA: Insufficient documentation

## 2017-01-30 DIAGNOSIS — Z6837 Body mass index (BMI) 37.0-37.9, adult: Secondary | ICD-10-CM | POA: Insufficient documentation

## 2017-01-30 DIAGNOSIS — I10 Essential (primary) hypertension: Secondary | ICD-10-CM | POA: Insufficient documentation

## 2017-01-30 DIAGNOSIS — M1990 Unspecified osteoarthritis, unspecified site: Secondary | ICD-10-CM | POA: Insufficient documentation

## 2017-01-30 DIAGNOSIS — Z79899 Other long term (current) drug therapy: Secondary | ICD-10-CM | POA: Insufficient documentation

## 2017-01-30 DIAGNOSIS — Z7982 Long term (current) use of aspirin: Secondary | ICD-10-CM | POA: Insufficient documentation

## 2017-01-30 DIAGNOSIS — C61 Malignant neoplasm of prostate: Secondary | ICD-10-CM | POA: Insufficient documentation

## 2017-01-30 HISTORY — PX: VENTRAL HERNIA REPAIR: SHX424

## 2017-01-30 HISTORY — PX: INSERTION OF MESH: SHX5868

## 2017-01-30 SURGERY — REPAIR, HERNIA, VENTRAL, LAPAROSCOPIC
Anesthesia: General | Site: Abdomen

## 2017-01-30 MED ORDER — CARVEDILOL 12.5 MG PO TABS
12.5000 mg | ORAL_TABLET | Freq: Two times a day (BID) | ORAL | Status: DC
  Administered 2017-01-30 – 2017-01-31 (×2): 12.5 mg via ORAL
  Filled 2017-01-30 (×2): qty 1

## 2017-01-30 MED ORDER — ALUM & MAG HYDROXIDE-SIMETH 200-200-20 MG/5ML PO SUSP: 30.0000 mL | Freq: Four times a day (QID) | ORAL | Status: DC | PRN

## 2017-01-30 MED ORDER — LACTATED RINGERS IV BOLUS (SEPSIS): 1000.0000 mL | Freq: Three times a day (TID) | INTRAVENOUS | Status: DC | PRN

## 2017-01-30 MED ORDER — AMLODIPINE BESYLATE 5 MG PO TABS
5.0000 mg | ORAL_TABLET | Freq: Every day | ORAL | Status: DC
  Administered 2017-01-30: 5 mg via ORAL
  Filled 2017-01-30: qty 1

## 2017-01-30 MED ORDER — ONDANSETRON HCL 4 MG/2ML IJ SOLN
INTRAMUSCULAR | Status: AC
  Filled 2017-01-30: qty 2

## 2017-01-30 MED ORDER — EPHEDRINE 5 MG/ML INJ
INTRAVENOUS | Status: AC
  Filled 2017-01-30: qty 10

## 2017-01-30 MED ORDER — FENTANYL CITRATE (PF) 250 MCG/5ML IJ SOLN
INTRAMUSCULAR | Status: AC
  Filled 2017-01-30: qty 5

## 2017-01-30 MED ORDER — MIDAZOLAM HCL 5 MG/5ML IJ SOLN
INTRAMUSCULAR | Status: DC | PRN
  Administered 2017-01-30: 2 mg via INTRAVENOUS

## 2017-01-30 MED ORDER — POLYETHYLENE GLYCOL 3350 17 G PO PACK: 17.0000 g | PACK | Freq: Every day | ORAL | Status: DC | PRN

## 2017-01-30 MED ORDER — GABAPENTIN 300 MG PO CAPS
300.0000 mg | ORAL_CAPSULE | ORAL | Status: AC
Start: 2017-01-30 — End: 2017-01-30
  Administered 2017-01-30: 300 mg via ORAL
  Filled 2017-01-30: qty 1

## 2017-01-30 MED ORDER — OXYCODONE HCL 5 MG PO TABS: 5.0000 mg | ORAL_TABLET | ORAL | 0 refills | Status: DC | PRN

## 2017-01-30 MED ORDER — LIDOCAINE 2% (20 MG/ML) 5 ML SYRINGE
INTRAMUSCULAR | Status: DC | PRN
  Administered 2017-01-30: 80 mg via INTRAVENOUS

## 2017-01-30 MED ORDER — DIPHENHYDRAMINE HCL 12.5 MG/5ML PO ELIX: 12.5000 mg | ORAL_SOLUTION | Freq: Four times a day (QID) | ORAL | Status: DC | PRN

## 2017-01-30 MED ORDER — PROMETHAZINE HCL 25 MG/ML IJ SOLN
6.2500 mg | Freq: Once | INTRAMUSCULAR | Status: AC
  Administered 2017-01-30: 6.25 mg via INTRAVENOUS

## 2017-01-30 MED ORDER — STERILE WATER FOR IRRIGATION IR SOLN
Status: DC | PRN
  Administered 2017-01-30: 1000 mL

## 2017-01-30 MED ORDER — LACTATED RINGERS IV SOLN: 1000.0000 mL | Freq: Three times a day (TID) | INTRAVENOUS | Status: DC | PRN

## 2017-01-30 MED ORDER — ACETAMINOPHEN 500 MG PO TABS
1000.0000 mg | ORAL_TABLET | Freq: Three times a day (TID) | ORAL | Status: DC
  Administered 2017-01-30 – 2017-01-31 (×2): 1000 mg via ORAL
  Filled 2017-01-30 (×2): qty 2

## 2017-01-30 MED ORDER — ENOXAPARIN SODIUM 40 MG/0.4ML ~~LOC~~ SOLN
40.0000 mg | SUBCUTANEOUS | Status: DC
  Administered 2017-01-31: 40 mg via SUBCUTANEOUS
  Filled 2017-01-30: qty 0.4

## 2017-01-30 MED ORDER — SUCCINYLCHOLINE CHLORIDE 200 MG/10ML IV SOSY
PREFILLED_SYRINGE | INTRAVENOUS | Status: DC | PRN
  Administered 2017-01-30: 140 mg via INTRAVENOUS

## 2017-01-30 MED ORDER — ONDANSETRON 4 MG PO TBDP: 4.0000 mg | ORAL_TABLET | Freq: Four times a day (QID) | ORAL | Status: DC | PRN

## 2017-01-30 MED ORDER — TRAZODONE HCL 50 MG PO TABS
50.0000 mg | ORAL_TABLET | Freq: Once | ORAL | Status: AC
  Administered 2017-01-30: 50 mg via ORAL
  Filled 2017-01-30: qty 1

## 2017-01-30 MED ORDER — MIDAZOLAM HCL 2 MG/2ML IJ SOLN
1.0000 mg | INTRAMUSCULAR | Status: DC
  Administered 2017-01-30: 2 mg via INTRAVENOUS
  Filled 2017-01-30: qty 2

## 2017-01-30 MED ORDER — ALBUTEROL SULFATE (2.5 MG/3ML) 0.083% IN NEBU: 2.5000 mg | INHALATION_SOLUTION | Freq: Four times a day (QID) | RESPIRATORY_TRACT | Status: DC | PRN

## 2017-01-30 MED ORDER — PSYLLIUM 95 % PO PACK
1.0000 | PACK | Freq: Every day | ORAL | Status: DC
  Administered 2017-01-31: 1 via ORAL
  Filled 2017-01-30: qty 1

## 2017-01-30 MED ORDER — EPHEDRINE SULFATE-NACL 50-0.9 MG/10ML-% IV SOSY
PREFILLED_SYRINGE | INTRAVENOUS | Status: DC | PRN
  Administered 2017-01-30 (×3): 10 mg via INTRAVENOUS

## 2017-01-30 MED ORDER — ENSURE SURGERY PO LIQD
237.0000 mL | Freq: Two times a day (BID) | ORAL | Status: DC
  Administered 2017-01-30: 237 mL via ORAL
  Filled 2017-01-30 (×3): qty 237

## 2017-01-30 MED ORDER — ROCURONIUM BROMIDE 50 MG/5ML IV SOSY
PREFILLED_SYRINGE | INTRAVENOUS | Status: AC
  Filled 2017-01-30: qty 5

## 2017-01-30 MED ORDER — HYDROCORTISONE 1 % EX CREA: 1.0000 "application " | TOPICAL_CREAM | Freq: Three times a day (TID) | CUTANEOUS | Status: DC | PRN

## 2017-01-30 MED ORDER — OXYCODONE HCL 5 MG PO TABS: 5.0000 mg | ORAL_TABLET | Freq: Once | ORAL | Status: DC | PRN

## 2017-01-30 MED ORDER — MENTHOL 3 MG MT LOZG: 1.0000 | LOZENGE | OROMUCOSAL | Status: DC | PRN

## 2017-01-30 MED ORDER — FENTANYL CITRATE (PF) 100 MCG/2ML IJ SOLN
INTRAMUSCULAR | Status: DC | PRN
  Administered 2017-01-30 (×2): 50 ug via INTRAVENOUS
  Administered 2017-01-30: 100 ug via INTRAVENOUS
  Administered 2017-01-30: 50 ug via INTRAVENOUS

## 2017-01-30 MED ORDER — HYDROMORPHONE HCL 1 MG/ML IJ SOLN: 0.5000 mg | INTRAMUSCULAR | Status: DC | PRN

## 2017-01-30 MED ORDER — ROCURONIUM BROMIDE 50 MG/5ML IV SOSY
PREFILLED_SYRINGE | INTRAVENOUS | Status: AC
Start: 2017-01-30 — End: 2017-01-30
  Filled 2017-01-30: qty 5

## 2017-01-30 MED ORDER — SODIUM CHLORIDE 0.9 % IV SOLN: INTRAVENOUS | Status: DC

## 2017-01-30 MED ORDER — DEXAMETHASONE SODIUM PHOSPHATE 10 MG/ML IJ SOLN
INTRAMUSCULAR | Status: AC
  Filled 2017-01-30: qty 1

## 2017-01-30 MED ORDER — GUAIFENESIN-DM 100-10 MG/5ML PO SYRP: 10.0000 mL | ORAL_SOLUTION | ORAL | Status: DC | PRN

## 2017-01-30 MED ORDER — ONDANSETRON HCL 4 MG/2ML IJ SOLN
INTRAMUSCULAR | Status: DC | PRN
  Administered 2017-01-30: 4 mg via INTRAVENOUS

## 2017-01-30 MED ORDER — METHOCARBAMOL 500 MG PO TABS: 750.0000 mg | ORAL_TABLET | Freq: Four times a day (QID) | ORAL | Status: DC | PRN

## 2017-01-30 MED ORDER — PROMETHAZINE HCL 25 MG/ML IJ SOLN
INTRAMUSCULAR | Status: AC
  Filled 2017-01-30: qty 1

## 2017-01-30 MED ORDER — HYDROCORTISONE 2.5 % RE CREA: 1.0000 "application " | TOPICAL_CREAM | Freq: Four times a day (QID) | RECTAL | Status: DC | PRN

## 2017-01-30 MED ORDER — ASPIRIN EC 81 MG PO TBEC
81.0000 mg | DELAYED_RELEASE_TABLET | Freq: Every day | ORAL | Status: DC
  Administered 2017-01-31: 81 mg via ORAL
  Filled 2017-01-30: qty 1

## 2017-01-30 MED ORDER — LIP MEDEX EX OINT
1.0000 "application " | TOPICAL_OINTMENT | Freq: Two times a day (BID) | CUTANEOUS | Status: DC
  Administered 2017-01-30: 1 via TOPICAL
  Filled 2017-01-30 (×2): qty 7

## 2017-01-30 MED ORDER — KETAMINE HCL 10 MG/ML IJ SOLN
INTRAMUSCULAR | Status: DC | PRN
  Administered 2017-01-30: 35 mg via INTRAVENOUS

## 2017-01-30 MED ORDER — SIMETHICONE 80 MG PO CHEW: 40.0000 mg | CHEWABLE_TABLET | Freq: Four times a day (QID) | ORAL | Status: DC | PRN

## 2017-01-30 MED ORDER — METOPROLOL TARTRATE 5 MG/5ML IV SOLN: 5.0000 mg | Freq: Four times a day (QID) | INTRAVENOUS | Status: DC | PRN

## 2017-01-30 MED ORDER — BUPIVACAINE-EPINEPHRINE (PF) 0.5% -1:200000 IJ SOLN
INTRAMUSCULAR | Status: DC | PRN
  Administered 2017-01-30: 30 mL

## 2017-01-30 MED ORDER — MAGIC MOUTHWASH
15.0000 mL | Freq: Four times a day (QID) | ORAL | Status: DC | PRN
  Filled 2017-01-30: qty 15

## 2017-01-30 MED ORDER — BISACODYL 10 MG RE SUPP: 10.0000 mg | Freq: Every day | RECTAL | Status: DC | PRN

## 2017-01-30 MED ORDER — SODIUM CHLORIDE 0.9% FLUSH: 3.0000 mL | Freq: Two times a day (BID) | INTRAVENOUS | Status: DC

## 2017-01-30 MED ORDER — NITROGLYCERIN 0.4 MG SL SUBL: 0.4000 mg | SUBLINGUAL_TABLET | SUBLINGUAL | Status: DC | PRN

## 2017-01-30 MED ORDER — LACTATED RINGERS IR SOLN
Status: DC | PRN
  Administered 2017-01-30: 1000 mL

## 2017-01-30 MED ORDER — SUGAMMADEX SODIUM 500 MG/5ML IV SOLN
INTRAVENOUS | Status: AC
  Filled 2017-01-30: qty 5

## 2017-01-30 MED ORDER — HYDRALAZINE HCL 20 MG/ML IJ SOLN: 5.0000 mg | INTRAMUSCULAR | Status: DC | PRN

## 2017-01-30 MED ORDER — PROPOFOL 10 MG/ML IV BOLUS
INTRAVENOUS | Status: AC
Start: 2017-01-30 — End: 2017-01-30
  Filled 2017-01-30: qty 20

## 2017-01-30 MED ORDER — ONDANSETRON HCL 4 MG/2ML IJ SOLN
4.0000 mg | Freq: Once | INTRAMUSCULAR | Status: AC | PRN
  Administered 2017-01-30: 4 mg via INTRAVENOUS

## 2017-01-30 MED ORDER — SODIUM CHLORIDE 0.9% FLUSH: 3.0000 mL | INTRAVENOUS | Status: DC | PRN

## 2017-01-30 MED ORDER — LIDOCAINE 2% (20 MG/ML) 5 ML SYRINGE
INTRAMUSCULAR | Status: AC
  Filled 2017-01-30: qty 5

## 2017-01-30 MED ORDER — PROCHLORPERAZINE EDISYLATE 5 MG/ML IJ SOLN: 5.0000 mg | Freq: Four times a day (QID) | INTRAMUSCULAR | Status: DC | PRN

## 2017-01-30 MED ORDER — LIDOCAINE HCL 2 % IJ SOLN
INTRAMUSCULAR | Status: AC
  Filled 2017-01-30: qty 20

## 2017-01-30 MED ORDER — OXYCODONE HCL 5 MG/5ML PO SOLN
5.0000 mg | Freq: Once | ORAL | Status: DC | PRN
  Filled 2017-01-30: qty 5

## 2017-01-30 MED ORDER — CEFAZOLIN SODIUM-DEXTROSE 2-4 GM/100ML-% IV SOLN
2.0000 g | Freq: Three times a day (TID) | INTRAVENOUS | Status: AC
  Administered 2017-01-30: 2 g via INTRAVENOUS
  Filled 2017-01-30: qty 100

## 2017-01-30 MED ORDER — PROCHLORPERAZINE MALEATE 10 MG PO TABS: 10.0000 mg | ORAL_TABLET | Freq: Four times a day (QID) | ORAL | Status: DC | PRN

## 2017-01-30 MED ORDER — DIPHENHYDRAMINE HCL 50 MG/ML IJ SOLN: 12.5000 mg | Freq: Four times a day (QID) | INTRAMUSCULAR | Status: DC | PRN

## 2017-01-30 MED ORDER — PROPOFOL 10 MG/ML IV BOLUS
INTRAVENOUS | Status: DC | PRN
  Administered 2017-01-30: 200 mg via INTRAVENOUS

## 2017-01-30 MED ORDER — ATORVASTATIN CALCIUM 40 MG PO TABS
80.0000 mg | ORAL_TABLET | Freq: Every day | ORAL | Status: DC
  Administered 2017-01-30: 80 mg via ORAL
  Filled 2017-01-30: qty 2

## 2017-01-30 MED ORDER — BUPIVACAINE LIPOSOME 1.3 % IJ SUSP
INTRAMUSCULAR | Status: DC | PRN
  Administered 2017-01-30: 20 mL

## 2017-01-30 MED ORDER — GABAPENTIN 300 MG PO CAPS
300.0000 mg | ORAL_CAPSULE | Freq: Two times a day (BID) | ORAL | Status: DC
  Administered 2017-01-30 – 2017-01-31 (×2): 300 mg via ORAL
  Filled 2017-01-30 (×2): qty 1

## 2017-01-30 MED ORDER — DEXAMETHASONE SODIUM PHOSPHATE 10 MG/ML IJ SOLN
INTRAMUSCULAR | Status: DC | PRN
  Administered 2017-01-30: 10 mg via INTRAVENOUS

## 2017-01-30 MED ORDER — ACETAMINOPHEN 500 MG PO TABS
1000.0000 mg | ORAL_TABLET | ORAL | Status: AC
  Administered 2017-01-30: 1000 mg via ORAL
  Filled 2017-01-30: qty 2

## 2017-01-30 MED ORDER — MIDAZOLAM HCL 2 MG/2ML IJ SOLN
INTRAMUSCULAR | Status: AC
  Filled 2017-01-30: qty 2

## 2017-01-30 MED ORDER — PHENOL 1.4 % MT LIQD: 1.0000 | OROMUCOSAL | Status: DC | PRN

## 2017-01-30 MED ORDER — LOSARTAN POTASSIUM 50 MG PO TABS
100.0000 mg | ORAL_TABLET | Freq: Every day | ORAL | Status: DC
  Administered 2017-01-31: 100 mg via ORAL
  Filled 2017-01-30: qty 2

## 2017-01-30 MED ORDER — LACTATED RINGERS IV SOLN
INTRAVENOUS | Status: DC
  Administered 2017-01-30: 10:00:00 via INTRAVENOUS
  Administered 2017-01-30: 1000 mL via INTRAVENOUS

## 2017-01-30 MED ORDER — 0.9 % SODIUM CHLORIDE (POUR BTL) OPTIME
TOPICAL | Status: DC | PRN
  Administered 2017-01-30: 1000 mL

## 2017-01-30 MED ORDER — OXYCODONE HCL 5 MG PO TABS: 5.0000 mg | ORAL_TABLET | ORAL | Status: DC | PRN

## 2017-01-30 MED ORDER — BUPIVACAINE-EPINEPHRINE (PF) 0.25% -1:200000 IJ SOLN
INTRAMUSCULAR | Status: AC
  Filled 2017-01-30: qty 60

## 2017-01-30 MED ORDER — DEXTROSE 5 % IV SOLN
INTRAVENOUS | Status: DC | PRN
  Administered 2017-01-30: 3 g via INTRAVENOUS

## 2017-01-30 MED ORDER — ROCURONIUM BROMIDE 10 MG/ML (PF) SYRINGE
PREFILLED_SYRINGE | INTRAVENOUS | Status: DC | PRN
  Administered 2017-01-30: 50 mg via INTRAVENOUS
  Administered 2017-01-30: 10 mg via INTRAVENOUS

## 2017-01-30 MED ORDER — SODIUM CHLORIDE 0.9 % IV SOLN: 250.0000 mL | INTRAVENOUS | Status: DC | PRN

## 2017-01-30 MED ORDER — ONDANSETRON HCL 4 MG/2ML IJ SOLN: 4.0000 mg | Freq: Four times a day (QID) | INTRAMUSCULAR | Status: DC | PRN

## 2017-01-30 MED ORDER — FENTANYL CITRATE (PF) 100 MCG/2ML IJ SOLN
50.0000 ug | INTRAMUSCULAR | Status: DC
  Administered 2017-01-30 (×2): 50 ug via INTRAVENOUS
  Filled 2017-01-30: qty 2

## 2017-01-30 MED ORDER — BUPIVACAINE-EPINEPHRINE 0.25% -1:200000 IJ SOLN
INTRAMUSCULAR | Status: DC | PRN
  Administered 2017-01-30: 60 mL

## 2017-01-30 MED ORDER — FENTANYL CITRATE (PF) 100 MCG/2ML IJ SOLN: 25.0000 ug | INTRAMUSCULAR | Status: DC | PRN

## 2017-01-30 MED ORDER — SUGAMMADEX SODIUM 500 MG/5ML IV SOLN
INTRAVENOUS | Status: DC | PRN
  Administered 2017-01-30: 250 mg via INTRAVENOUS

## 2017-01-30 SURGICAL SUPPLY — 41 items
APPLIER CLIP 5 13 M/L LIGAMAX5 (MISCELLANEOUS)
APR CLP MED LRG 5 ANG JAW (MISCELLANEOUS)
BINDER ABDOMINAL 12 ML 46-62 (SOFTGOODS) ×1 IMPLANT
CABLE HIGH FREQUENCY MONO STRZ (ELECTRODE) ×3 IMPLANT
CHLORAPREP W/TINT 26ML (MISCELLANEOUS) ×3 IMPLANT
CLIP APPLIE 5 13 M/L LIGAMAX5 (MISCELLANEOUS) IMPLANT
COVER SURGICAL LIGHT HANDLE (MISCELLANEOUS) ×3 IMPLANT
DECANTER SPIKE VIAL GLASS SM (MISCELLANEOUS) ×3 IMPLANT
DEVICE SECURE STRAP 25 ABSORB (INSTRUMENTS) ×2 IMPLANT
DEVICE TROCAR PUNCTURE CLOSURE (ENDOMECHANICALS) ×3 IMPLANT
DRAPE WARM FLUID 44X44 (DRAPE) ×3 IMPLANT
DRSG TEGADERM 2-3/8X2-3/4 SM (GAUZE/BANDAGES/DRESSINGS) ×5 IMPLANT
DRSG TEGADERM 4X4.75 (GAUZE/BANDAGES/DRESSINGS) ×1 IMPLANT
ELECT REM PT RETURN 15FT ADLT (MISCELLANEOUS) ×3 IMPLANT
GAUZE SPONGE 2X2 8PLY STRL LF (GAUZE/BANDAGES/DRESSINGS) IMPLANT
GLOVE ECLIPSE 8.0 STRL XLNG CF (GLOVE) ×3 IMPLANT
GLOVE INDICATOR 8.0 STRL GRN (GLOVE) ×3 IMPLANT
GOWN STRL REUS W/TWL XL LVL3 (GOWN DISPOSABLE) ×6 IMPLANT
IRRIG SUCT STRYKERFLOW 2 WTIP (MISCELLANEOUS)
IRRIGATION SUCT STRKRFLW 2 WTP (MISCELLANEOUS) IMPLANT
KIT BASIN OR (CUSTOM PROCEDURE TRAY) ×3 IMPLANT
MARKER SKIN DUAL TIP RULER LAB (MISCELLANEOUS) ×3 IMPLANT
MESH VENTRALIGHT ST 6X8 (Mesh Specialty) ×3 IMPLANT
MESH VENTRLGHT ELLIPSE 8X6XMFL (Mesh Specialty) IMPLANT
NDL SPNL 22GX3.5 QUINCKE BK (NEEDLE) IMPLANT
NEEDLE SPNL 22GX3.5 QUINCKE BK (NEEDLE) IMPLANT
PAD POSITIONING PINK XL (MISCELLANEOUS) ×3 IMPLANT
SCISSORS METZENBAUM CVD 33 (INSTRUMENTS) ×3 IMPLANT
SHEARS HARMONIC ACE PLUS 36CM (ENDOMECHANICALS) IMPLANT
SLEEVE ADV FIXATION 5X100MM (TROCAR) ×3 IMPLANT
SPONGE GAUZE 2X2 STER 10/PKG (GAUZE/BANDAGES/DRESSINGS) ×1
STRIP CLOSURE SKIN 1/2X4 (GAUZE/BANDAGES/DRESSINGS) ×4 IMPLANT
SUT MNCRL AB 4-0 PS2 18 (SUTURE) ×3 IMPLANT
SUT PDS AB 1 CT1 27 (SUTURE) ×6 IMPLANT
SUT PROLENE 1 CT 1 30 (SUTURE) ×22 IMPLANT
TOWEL OR 17X26 10 PK STRL BLUE (TOWEL DISPOSABLE) ×3 IMPLANT
TRAY LAPAROSCOPIC (CUSTOM PROCEDURE TRAY) ×3 IMPLANT
TROCAR ADV FIXATION 11X100MM (TROCAR) IMPLANT
TROCAR ADV FIXATION 5X100MM (TROCAR) ×3 IMPLANT
TROCAR BLADELESS OPT 5 100 (ENDOMECHANICALS) ×3 IMPLANT
TUBING INSUF HEATED (TUBING) ×3 IMPLANT

## 2017-01-30 NOTE — Transfer of Care (Signed)
Immediate Anesthesia Transfer of Care Note  Patient: Jeffery [name]  Procedure(s) Performed: LAPAROSCOPIC REPAIR LEFT FLANK INCARCERATED INCISIONAL HERNIA WITH MESH ERAS PATHWAY (Left Abdomen) INSERTION OF MESH (N/A )  Patient Location: PACU  Anesthesia Type:General  Level of Consciousness: awake, alert  and oriented  Airway & Oxygen Therapy: Patient Spontanous Breathing and Patient connected to face mask oxygen  Post-op Assessment: Report given to RN and Post -op Vital signs reviewed and stable  Post vital signs: Reviewed and stable  Last Vitals:  Vitals:   01/30/17 1104 01/30/17 1106  BP: 137/76   Pulse: (!) 56 (!) 52  Resp: 16 13  Temp:    SpO2: 100% 100%    Last Pain:  Vitals:   01/30/17 0931  TempSrc: Oral         Complications: No apparent anesthesia complications

## 2017-01-30 NOTE — Anesthesia Procedure Notes (Signed)
Procedure Name: Intubation Date/Time: 01/30/2017 11:44 AM Performed by: Gregrey Bloyd, Clinical cytogeneticist D, CRNA Pre-anesthesia Checklist: Patient identified, Emergency Drugs available, Suction available and Patient being monitored Patient Re-evaluated:Patient Re-evaluated prior to induction Oxygen Delivery Method: Circle system utilized Preoxygenation: Pre-oxygenation with 100% oxygen Induction Type: IV induction Ventilation: Mask ventilation without difficulty Laryngoscope Size: Sabra Heck and 3 (S Conservation officer, historic buildings) Grade View: Grade I Tube type: Oral Number of attempts: 1 Airway Equipment and Method: Stylet Placement Confirmation: ETT inserted through vocal cords under direct vision,  positive ETCO2 and breath sounds checked- equal and bilateral Tube secured with: Tape Dental Injury: Teeth and Oropharynx as per pre-operative assessment

## 2017-01-30 NOTE — Discharge Instructions (Signed)
HERNIA REPAIR: POST OP INSTRUCTIONS ° °###################################################################### ° °EAT °Gradually transition to a high fiber diet with a fiber supplement over the next few weeks after discharge.  Start with a pureed / full liquid diet (see below) ° °WALK °Walk an hour a day.  Control your pain to do that.   ° °CONTROL PAIN °Control pain so that you can walk, sleep, tolerate sneezing/coughing, go up/down stairs. ° °HAVE A BOWEL MOVEMENT DAILY °Keep your bowels regular to avoid problems.  OK to try a laxative to override constipation.  OK to use an antidairrheal to slow down diarrhea.  Call if not better after 2 tries ° °CALL IF YOU HAVE PROBLEMS/CONCERNS °Call if you are still struggling despite following these instructions. °Call if you have concerns not answered by these instructions ° °###################################################################### ° ° ° °1. DIET: Follow a light bland diet the first 24 hours after arrival home, such as soup, liquids, crackers, etc.  Be sure to include lots of fluids daily.  Avoid fast food or heavy meals as your are more likely to get nauseated.  Eat a low fat the next few days after surgery. °2. Take your usually prescribed home medications unless otherwise directed. °3. PAIN CONTROL: °a. Pain is best controlled by a usual combination of three different methods TOGETHER: °i. Ice/Heat °ii. Over the counter pain medication °iii. Prescription pain medication °b. Most patients will experience some swelling and bruising around the hernia(s) such as the bellybutton, groins, or old incisions.  Ice packs or heating pads (30-60 minutes up to 6 times a day) will help. Use ice for the first few days to help decrease swelling and bruising, then switch to heat to help relax tight/sore spots and speed recovery.  Some people prefer to use ice alone, heat alone, alternating between ice & heat.  Experiment to what works for you.  Swelling and bruising can take  several weeks to resolve.   °c. It is helpful to take an over-the-counter pain medication regularly for the first few weeks.  Choose one of the following that works best for you: °i. Naproxen (Aleve, etc)  Two 220mg tabs twice a day °ii. Ibuprofen (Advil, etc) Three 200mg tabs four times a day (every meal & bedtime) °iii. Acetaminophen (Tylenol, etc) 325-650mg four times a day (every meal & bedtime) °d. A  prescription for pain medication should be given to you upon discharge.  Take your pain medication as prescribed.  °i. If you are having problems/concerns with the prescription medicine (does not control pain, nausea, vomiting, rash, itching, etc), please call us [phone] to see if we need to switch you to a different pain medicine that will work better for you and/or control your side effect better. °ii. If you need a refill on your pain medication, please contact your pharmacy.  They will contact our office to request authorization. Prescriptions will not be filled after 5 pm or on week-ends. °4. Avoid getting constipated.  Between the surgery and the pain medications, it is common to experience some constipation.  Increasing fluid intake and taking a fiber supplement (such as Metamucil, Citrucel, FiberCon, MiraLax, etc) 1-2 times a day regularly will usually help prevent this problem from occurring.  A mild laxative (prune juice, Milk of Magnesia, MiraLax, etc) should be taken according to package directions if there are no bowel movements after 48 hours.   °5. Wash / shower every day.  You may shower over the dressings as they are waterproof.   °6. Remove   your waterproof bandages 5 days after surgery.  You may leave the incision open to air.  You may replace a dressing/Band-Aid to cover the incision for comfort if you wish.  Continue to shower over incision(s) after the dressing is off.    7. ACTIVITIES as tolerated:   a. You may resume regular (light) daily activities beginning the next day--such  as daily self-care, walking, climbing stairs--gradually increasing activities as tolerated.  If you can walk 30 minutes without difficulty, it is safe to try more intense activity such as jogging, treadmill, bicycling, low-impact aerobics, swimming, etc. b. Save the most intensive and strenuous activity for last such as sit-ups, heavy lifting, contact sports, etc  Refrain from any heavy lifting or straining until you are off narcotics for pain control.   c. DO NOT PUSH THROUGH PAIN.  Let pain be your guide: If it hurts to do something, don't do it.  Pain is your body warning you to avoid that activity for another week until the pain goes down. d. You may drive when you are no longer taking prescription pain medication, you can comfortably wear a seatbelt, and you can safely maneuver your car and apply brakes. e. Dennis Bast may have sexual intercourse when it is comfortable.  8. FOLLOW UP in our office a. Please call [facility] at [phone] to set up an appointment to see your surgeon in the office for a follow-up appointment approximately 2-3 weeks after your surgery. b. Make sure that you call for this appointment the day you arrive home to insure a convenient appointment time. 9.  IF YOU HAVE DISABILITY OR FAMILY LEAVE FORMS, BRING THEM TO THE OFFICE FOR PROCESSING.  DO NOT GIVE THEM TO YOUR DOCTOR.  WHEN TO CALL us [phone]: 1. Poor pain control 2. Reactions / problems with new medications (rash/itching, nausea, etc)  3. Fever over 101.5 F (38.5 C) 4. Inability to urinate 5. Nausea and/or vomiting 6. Worsening swelling or bruising 7. Continued bleeding from incision. 8. Increased pain, redness, or drainage from the incision   The clinic staff is available to answer your questions during regular business hours (8:30am-5pm).  Please dont hesitate to call and ask to speak to one of our nurses for clinical concerns.   If you have a medical emergency, go to the nearest emergency room or call  911.  A surgeon from [facility] is always on call at the hospitals in [facility], [place], [place], [place], [place]  [address] ?  [address] [address], [place], [place]   [address] MAIN: [phone] ? TOLL FREE: [phone] ? FAX: [phone] [email or web address]  Hernia, Adult A hernia is the bulging of an organ or tissue through a weak spot in the muscles of the abdomen (abdominal wall). Hernias develop most often near the navel or groin. There are many kinds of hernias. Common kinds include:  Femoral hernia. This kind of hernia develops under the groin in the upper thigh area.  Inguinal hernia. This kind of hernia develops in the groin or scrotum.  Umbilical hernia. This kind of hernia develops near the navel.  Hiatal hernia. This kind of hernia causes part of the stomach to be pushed up into the chest.  Incisional hernia. This kind of hernia bulges through a scar from an abdominal surgery.  What are the causes? This condition may be caused by:  Heavy lifting.  Coughing over a long period of time.  Straining to have  a bowel movement.  An incision made during an abdominal surgery.  A birth defect (congenital defect).  Excess weight or obesity.  Smoking.  Poor nutrition.  Cystic fibrosis.  Excess fluid in the abdomen.  Undescended testicles.  What are the signs or symptoms? Symptoms of a hernia include:  A lump on the abdomen. This is the first sign of a hernia. The lump may become more obvious with standing, straining, or coughing. It may get bigger over time if it is not treated or if the condition causing it is not treated.  Pain. A hernia is usually painless, but it may become painful over time if treatment is delayed. The pain is usually dull and may get worse with standing or lifting heavy objects.  Sometimes a hernia gets tightly squeezed in the weak spot (strangulated) or stuck there  (incarcerated) and causes additional symptoms. These symptoms may include:  Vomiting.  Nausea.  Constipation.  Irritability.  How is this diagnosed? A hernia may be diagnosed with:  A physical exam. During the exam your health care provider may ask you to cough or to make a specific movement, because a hernia is usually more visible when you move.  Imaging tests. These can include: ? X-rays. ? Ultrasound. ? CT scan.  How is this treated? A hernia that is small and painless may not need to be treated. A hernia that is large or painful may be treated with surgery. Inguinal hernias may be treated with surgery to prevent incarceration or strangulation. Strangulated hernias are always treated with surgery, because lack of blood to the trapped organ or tissue can cause it to die. Surgery to treat a hernia involves pushing the bulge back into place and repairing the weak part of the abdomen. Follow these instructions at home:  Avoid straining.  Do not lift anything heavier than 10 lb (4.5 kg).  Lift with your leg muscles, not your back muscles. This helps avoid strain.  When coughing, try to cough gently.  Prevent constipation. Constipation leads to straining with bowel movements, which can make a hernia worse or cause a hernia repair to break down. You can prevent constipation by: ? Eating a high-fiber diet that includes plenty of fruits and vegetables. ? Drinking enough fluids to keep your urine clear or pale yellow. Aim to drink 6-8 glasses of water per day. ? Using a stool softener as directed by your health care provider.  Lose weight, if you are overweight.  Do not use any tobacco products, including cigarettes, chewing tobacco, or electronic cigarettes. If you need help quitting, ask your health care provider.  Keep all follow-up visits as directed by your health care provider. This is important. Your health care provider may need to monitor your condition. Contact a health  care provider if:  You have swelling, redness, and pain in the affected area.  Your bowel habits change. Get help right away if:  You have a fever.  You have abdominal pain that is getting worse.  You feel nauseous or you vomit.  You cannot push the hernia back in place by gently pressing on it while you are lying down.  The hernia: ? Changes in shape or size. ? Is stuck outside the abdomen. ? Becomes discolored. ? Feels hard or tender. This information is not intended to replace advice given to you by your health care provider. Make sure you discuss any questions you have with your health care provider. Document Released: 12/24/2004 Document Revised: 05/24/2015 Document Reviewed:  11/03/2013 Elsevier Interactive Patient Education  2017 [employer].

## 2017-01-30 NOTE — Anesthesia Preprocedure Evaluation (Addendum)
Anesthesia Evaluation  Patient identified by MRN, date of birth, ID band Patient awake    Reviewed: Allergy & Precautions, H&P , NPO status , Patient's Chart, lab work & pertinent test results, reviewed documented beta blocker date and time   Airway Mallampati: II   Neck ROM: Full    Dental  (+) Dental Advisory Given   Pulmonary former smoker,    breath sounds clear to auscultation       Cardiovascular hypertension, Pt. on medications and Pt. on home beta blockers + CAD, + Past MI and + Cardiac Stents   Rhythm:Regular Rate:Normal  Cath 2017 - Prox LAD to Mid LAD lesion, 10% stenosed. The lesion was previously treated with a drug-eluting stent greater than two years ago. Dist LAD lesion, 20% stenosed. 1st Mrg lesion, 30% stenosed. Prox RCA lesion, 30% stenosed.  Stress test (pre-cath in 2017) - The stress ECG was consistent with myocardial ischemia. Duke scoring: exercise time of 7.5 min; maximum ST deviation of 1.5 mm; no angina; resulting score is 0. This score predicts a moderate risk of cardiac events. - Staged echo: Findings were positive for stress-induced ischemia. - Study suggests multiple vessel coronary artery disease. Study suggests myocardial ischemia, in the territory of the right coronary and left anterior descending arteries.   Neuro/Psych negative neurological ROS  negative psych ROS   GI/Hepatic GERD  Medicated and Controlled,Liver cyst   Endo/Other  Morbid obesity  Renal/GU negative Renal ROS   Prostate cancer    Musculoskeletal  (+) Arthritis ,   Abdominal   Peds  Hematology negative hematology ROS (+)   Anesthesia Other Findings   Reproductive/Obstetrics                           Anesthesia Physical  Anesthesia Plan  ASA: III  Anesthesia Plan: General   Post-op Pain Management:  Regional for Post-op pain   Induction: Intravenous  PONV Risk Score and Plan: 4  or greater and Treatment may vary due to age or medical condition, Dexamethasone, Ondansetron and Scopolamine patch - Pre-op  Airway Management Planned: Oral ETT  Additional Equipment: None  Intra-op Plan:   Post-operative Plan: Extubation in OR  Informed Consent: I have reviewed the patients History and Physical, chart, labs and discussed the procedure including the risks, benefits and alternatives for the proposed anesthesia with the patient or authorized representative who has indicated his/her understanding and acceptance.   Dental advisory given  Plan Discussed with: CRNA  Anesthesia Plan Comments: (TAP block per surgeon's request)        Anesthesia Quick Evaluation

## 2017-01-30 NOTE — H&P (Signed)
Jeffery [name] 12/09/2016 4:17 PM Location: [facility] Patient #: [id number] DOB: 22-Feb-1950 Undefined / Language: Jeffery [name] / Race: Refused to Report/Unreported Male   Patient Care Team: Lawerance Cruel, MD as PCP - General (Family Medicine) Jeffery Boston, MD as Consulting Physician (General Surgery) Lelon Perla, MD as Consulting Physician (Cardiology) Rana Snare, MD as Consulting Physician (Urology)  ` ` Patient sent for surgical consultation at the request of Dr. Via  Chief Complaint: Left flank pain and swelling. Probable hernia by CT scan.  The patient is a pleasant obese male. History laparoscopic cholecystectomy a long time ago. In robotic prostatectomy 2014 by Dr. Risa Grill with [facility]. He works as a Pharmacist, community. He felt a sharp pain in his left lower abdomen when he was pulling his payload door down. Felt a pop. He's had episodes of increasing discomfort. Cannot lie on that side. Persisted. He was concerned. Discussed with primary care physician. Suspicion of hernia versus lipoma. CT scan suspicious for hernia. Surgical consultation requested. A history of coronary disease. Had coronary stents in 2009. Followed by Dr. Stanford Breed. No major issues. On low-dose aspirin. No other blood thinners. He does not smoke. No history of skin infections. He can walk an hour a day without any difficulty. He is not a diabetic.   (Review of systems as stated in this history (HPI) or in the review of systems. Otherwise all other 12 point ROS are negative)   Past Surgical History Illene [name], CMA; 12/09/2016 4:18 PM) Foot Surgery  Left. Gallbladder Surgery - Laparoscopic  Gallbladder Surgery - Open  Oral Surgery  Prostate Surgery - Removal  Tonsillectomy  TURP   Diagnostic Studies History Jeffery [name], CMA; 12/09/2016 4:18 PM) Colonoscopy  within last year  Allergies Jeffery [name], CMA; 12/09/2016 4:19 PM) No Known Drug  Allergies 12/09/2016  Medication History (Jeffery [name], CMA; 12/09/2016 4:21 PM) Carvedilol (6.25MG  Tablet, Oral) Active. Losartan Potassium (100MG  Tablet, Oral) Active. Pantoprazole Sodium (40MG  Tablet DR, Oral) Active. TraZODone HCl (50MG  Tablet, Oral) Active. Azithromycin (500MG  Tablet, Oral) Active. Promethazine-DM (6.25-15MG /5ML Syrup, Oral) Active. Atorvastatin Calcium (80MG  Tablet, Oral) Active. AmLODIPine Besylate (5MG  Tablet, Oral) Active. Baby Aspirin (81MG  Tablet Chewable, Oral) Active. Fish Oil (1000MG  Capsule, Oral) Active. Stool Softener (100MG  Tablet, Oral) Active. Medications Reconciled  Social History Illene [name], CMA; 12/09/2016 4:18 PM) Alcohol use  Remotely quit alcohol use. Caffeine use  Coffee. No drug use  Tobacco use  Former smoker.  Family History Illene [name], CMA; 12/09/2016 4:18 PM) Diabetes Mellitus  Father. Heart Disease  Father. Prostate Cancer  Father.  Other Problems Illene [name], CMA; 12/09/2016 4:18 PM) Gastroesophageal Reflux Disease  High blood pressure  Hypercholesterolemia  Myocardial infarction  Prostate Cancer     Review of Systems (Jeffery [name] CMA; 12/09/2016 4:18 PM) General Present- Weight Gain. Not Present- Appetite Loss, Chills, Fatigue, Fever, Night Sweats and Weight Loss. Skin Not Present- Change in Wart/Mole, Dryness, Hives, Jaundice, New Lesions, Non-Healing Wounds, Rash and Ulcer. HEENT Present- Wears glasses/contact lenses. Not Present- Earache, Hearing Loss, Hoarseness, Nose Bleed, Oral Ulcers, Ringing in the Ears, Seasonal Allergies, Sinus Pain, Sore Throat, Visual Disturbances and Yellow Eyes. Respiratory Not Present- Bloody sputum, Chronic Cough, Difficulty Breathing, Snoring and Wheezing. Cardiovascular Not Present- Chest Pain, Difficulty Breathing Lying Down, Leg Cramps, Palpitations, Rapid Heart Rate, Shortness of Breath and Swelling of Extremities. Gastrointestinal Not  Present- Abdominal Pain, Bloating, Bloody Stool, Change in Bowel Habits, Chronic diarrhea, Constipation, Difficulty Swallowing, Excessive gas, Gets full quickly at meals, Hemorrhoids, Indigestion, Nausea,  Rectal Pain and Vomiting. Male Genitourinary Present- Urine Leakage. Not Present- Blood in Urine, Change in Urinary Stream, Frequency, Impotence, Nocturia, Painful Urination and Urgency. Musculoskeletal Not Present- Back Pain, Joint Pain, Joint Stiffness, Muscle Pain, Muscle Weakness and Swelling of Extremities. Neurological Not Present- Decreased Memory, Fainting, Headaches, Numbness, Seizures, Tingling, Tremor, Trouble walking and Weakness. Psychiatric Not Present- Anxiety, Bipolar, Change in Sleep Pattern, Depression, Fearful and Frequent crying. Endocrine Not Present- Cold Intolerance, Excessive Hunger, Hair Changes, Heat Intolerance, Hot flashes and New Diabetes. Hematology Not Present- Blood Thinners, Easy Bruising, Excessive bleeding, Gland problems, HIV and Persistent Infections.  Vitals (Jeffery [name] CMA; 12/09/2016 4:18 PM) 12/09/2016 4:18 PM Weight: 253 lb Height: 70in Body Surface Area: 2.31 m Body Mass Index: 36.3 kg/m  Pulse: 66 (Regular)  BP: 142/84 (Sitting, Left Arm, Standard)  BP (!) 151/84 (BP Location: Right Arm)   Pulse 63   Temp 98.1 F (36.7 C) (Oral)   Resp (!) 9   Ht 5' 9.5" (1.765 m)   Wt 115.7 kg (255 lb)   SpO2 99%   BMI 37.12 kg/m      Physical Exam Adin Hector MD; 12/09/2016 5:52 PM) General Mental Status-Alert. General Appearance-Not in acute distress, Not Sickly. Orientation-Oriented X3. Hydration-Well hydrated. Voice-Normal.  Integumentary Global Assessment Upon inspection and palpation of skin surfaces of the - Axillae: non-tender, no inflammation or ulceration, no drainage. and Distribution of scalp and body hair is normal. General Characteristics Temperature - normal warmth is noted.  Head and  Neck Head-normocephalic, atraumatic with no lesions or palpable masses. Face Global Assessment - atraumatic, no absence of expression. Neck Global Assessment - no abnormal movements, no bruit auscultated on the right, no bruit auscultated on the left, no decreased range of motion, non-tender. Trachea-midline. Thyroid Gland Characteristics - non-tender.  Eye Eyeball - Left-Extraocular movements intact, No Nystagmus. Eyeball - Right-Extraocular movements intact, No Nystagmus. Cornea - Left-No Hazy. Cornea - Right-No Hazy. Sclera/Conjunctiva - Left-No scleral icterus, No Discharge. Sclera/Conjunctiva - Right-No scleral icterus, No Discharge. Pupil - Left-Direct reaction to light normal. Pupil - Right-Direct reaction to light normal.  ENMT Ears Pinna - Left - no drainage observed, no generalized tenderness observed. Right - no drainage observed, no generalized tenderness observed. Nose and Sinuses External Inspection of the Nose - no destructive lesion observed. Inspection of the nares - Left - quiet respiration. Right - quiet respiration. Mouth and Throat Lips - Upper Lip - no fissures observed, no pallor noted. Lower Lip - no fissures observed, no pallor noted. Nasopharynx - no discharge present. Oral Cavity/Oropharynx - Tongue - no dryness observed. Oral Mucosa - no cyanosis observed. Hypopharynx - no evidence of airway distress observed.  Chest and Lung Exam Inspection Movements - Normal and Symmetrical. Accessory muscles - No use of accessory muscles in breathing. Palpation Palpation of the chest reveals - Non-tender. Auscultation Breath sounds - Normal and Clear.  Cardiovascular Auscultation Rhythm - Regular. Murmurs & Other Heart Sounds - Auscultation of the heart reveals - No Murmurs and No Systolic Clicks.  Abdomen Inspection Inspection of the abdomen reveals - No Visible peristalsis and No Abnormal pulsations. Umbilicus - No Bleeding, No Urine  drainage. Palpation/Percussion Palpation and Percussion of the abdomen reveal - Soft, Non Tender, No Rebound tenderness, No Rigidity (guarding) and No Cutaneous hyperesthesia. Note: Left flank tender mass just above the iliac crest. Correlates with CT scan spigelian/lumbar hernia. Most likely lateral port site incisional hernia from prior robotic prostatectomy  Abdomen obese but soft. Nontender. Not distended.  No umbilical or incisional hernias. No guarding.   Male Genitourinary Sexual Maturity Tanner 5 - Adult hair pattern and Adult penile size and shape.  Peripheral Vascular Upper Extremity Inspection - Left - No Cyanotic nailbeds, Not Ischemic. Right - No Cyanotic nailbeds, Not Ischemic.  Neurologic Neurologic evaluation reveals -normal attention span and ability to concentrate, able to name objects and repeat phrases. Appropriate fund of knowledge , normal sensation and normal coordination. Mental Status Affect - not angry, not paranoid. Cranial Nerves-Normal Bilaterally. Gait-Normal.  Neuropsychiatric Mental status exam performed with findings of-able to articulate well with normal speech/language, rate, volume and coherence, thought content normal with ability to perform basic computations and apply abstract reasoning and no evidence of hallucinations, delusions, obsessions or homicidal/suicidal ideation.  Musculoskeletal Global Assessment Spine, Ribs and Pelvis - no instability, subluxation or laxity. Right Upper Extremity - no instability, subluxation or laxity.  Lymphatic Head & Neck  General Head & Neck Lymphatics: Bilateral - Description - No Localized lymphadenopathy. Axillary  General Axillary Region: Bilateral - Description - No Localized lymphadenopathy. Femoral & Inguinal  Generalized Femoral & Inguinal Lymphatics: Left - Description - No Localized lymphadenopathy. Right - Description - No Localized lymphadenopathy.    Assessment &  Plan INCARCERATED INCISIONAL HERNIA (K43.0) Impression: Hernia in the left lower quadrant flank correlates with lateral robotic port site incision. Most likely extracting port from prostatectomy. Incarcerated.  I think he would benefit from hernia repair with mesh. Most likely left side up decubitus positioning. He probably will have increased pain given the atypical location. I doubt the fascial defects her that particularly large though. He is interested in proceeding. His Worker's Therapist, occupational agrees.  Ready for surgery.  Would like to make sure that he has cardiac clearance given his history of coronary disease and stenting. I believe Dr. Stanford Breed sees him annually. I doubt there will be any major concerns, especially since Dr. Lynelle Doctor was okay with proceeding with surgical consultation  The anatomy & physiology of the abdominal wall was discussed. The pathophysiology of hernias was discussed. Natural history risks without surgery including progeressive enlargement, pain, incarceration, & strangulation was discussed. Contributors to complications such as smoking, obesity, diabetes, prior surgery, etc were discussed.  I feel the risks of no intervention will lead to serious problems that outweigh the operative risks; therefore, I recommended surgery to reduce and repair the hernia. I explained laparoscopic techniques with possible need for an open approach. I noted the probable use of mesh to patch and/or buttress the hernia repair  Risks such as bleeding, infection, abscess, need for further treatment, heart attack, death, and other risks were discussed. I noted a good likelihood this will help address the problem. Goals of post-operative recovery were discussed as well. Possibility that this will not correct all symptoms was explained. I stressed the importance of low-impact activity, aggressive pain control, avoiding constipation, & not pushing through pain to minimize risk of  post-operative chronic pain or injury. Possibility of reherniation especially with smoking, obesity, diabetes, immunosuppression, and other health conditions was discussed. We will work to minimize complications.  An educational handout further explaining the pathology & treatment options was given as well. Questions were answered. The patient expresses understanding & wishes to proceed with surgery.  Pt Education - [facility] Hernia Post-Op HCI (Kriya Westra): discussed with patient and provided information. Pt Education - [facility] Pain Control (Qunisha Bryk) Pt Education - Pamphlet Given - Laparoscopic Hernia Repair: discussed with patient and provided information.   Adin Hector, M.D.,  F.A.C.S. Gastrointestinal and Minimally Invasive Surgery [facility] [address] [place], [place] [address] [phone] Main / Paging
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
## 2017-01-30 NOTE — Anesthesia Procedure Notes (Signed)
Anesthesia Regional Block: TAP block   Pre-Anesthetic Checklist: ,, timeout performed, Correct Patient, Correct Site, Correct Laterality, Correct Procedure, Correct Position, site marked, Risks and benefits discussed,  Surgical consent,  Pre-op evaluation,  At surgeon's request and post-op pain management  Laterality: Left  Prep: chloraprep       Needles:  Injection technique: Single-shot  Needle Type: Echogenic Needle     Needle Length: 10cm  Needle Gauge: 21     Additional Needles:   Narrative:  Start time: 01/30/2017 10:33 AM End time: 01/30/2017 10:36 AM Injection made incrementally with aspirations every 5 mL.  Performed by: Personally  Anesthesiologist: Audry Pili, MD  Additional Notes: No pain on injection. No increased resistance to injection. Injection made in 5cc increments. Good needle visualization. Patient tolerated the procedure well.

## 2017-01-30 NOTE — Progress Notes (Signed)
PACU note-----pt nauseated in PACU; med given at 315; pt continued to c/o nausea and frequently grabbing emesis basin; no emesis but very nauseated; 4pm med ordered per anesthesiologist for continued nausea; 430 pt very sleepy, nausea much better ; after discussion with pt and his wife, decision to spend night in hospital

## 2017-01-30 NOTE — Op Note (Signed)
01/30/2017  PATIENT:  Jeffery [name]  67 y.o. male  Patient Care Team: Lawerance Cruel, MD as PCP - General (Family Medicine) Michael Boston, MD as Consulting Physician (General Surgery) Stanford Breed, Denice Bors, MD as Consulting Physician (Cardiology) Rana Snare, MD as Consulting Physician (Urology)  PRE-OPERATIVE DIAGNOSIS:   Incisional incarcerated left flank hernia  POST-OPERATIVE DIAGNOSIS:  Incisional incarcerated left flank hernia  PROCEDURE:    LAPAROSCOPIC REPAIR LEFT FLANK INCARCERATED INCISIONAL HERNIA WITH MESH  INSERTION OF MESH  SURGEON:  Adin Hector, MD  ASSISTANT: Nurse   ANESTHESIA:     General Left TAP block Local anesthesia field block: (0.25% bupivacaine & liposomal  Bupivacaine [Experel])  EBL:  Total I/O In: -  Out: 25 [Blood:25]  Per anesthesia record  Delay start of Pharmacological VTE agent (>24hrs) due to surgical blood loss or risk of bleeding:  no  DRAINS: none   SPECIMEN:  No Specimen  DISPOSITION OF SPECIMEN:  N/A  COUNTS:  YES  PLAN OF CARE: Discharge to home after PACU  PATIENT DISPOSITION:  PACU - hemodynamically stable.  INDICATION: Pleasant patient has developed a ventral wall abdominal hernia.   Recommendation was made for surgical repair:  The anatomy & physiology of the abdominal wall was discussed. The pathophysiology of hernias was discussed. Natural history risks without surgery including progeressive enlargement, pain, incarceration & strangulation was discussed. Contributors to complications such as smoking, obesity, diabetes, prior surgery, etc were discussed.  I feel the risks of no intervention will lead to serious problems that outweigh the operative risks; therefore, I recommended surgery to reduce and repair the hernia. I explained laparoscopic techniques with possible need for an open approach. I noted the probable use of mesh to patch and/or buttress the hernia repair  Risks such as bleeding, infection, abscess,  need for further treatment, heart attack, death, and other risks were discussed. I noted a good likelihood this will help address the problem. Goals of post-operative recovery were discussed as well. Possibility that this will not correct all symptoms was explained. I stressed the importance of low-impact activity, aggressive pain control, avoiding constipation, & not pushing through pain to minimize risk of post-operative chronic pain or injury. Possibility of reherniation especially with smoking, obesity, diabetes, immunosuppression, and other health conditions was discussed. We will work to minimize complications.  An educational handout further explaining the pathology & treatment options was given as well. Questions were answered. The patient expresses understanding & wishes to proceed with surgery.   OR FINDINGS: 6 x 4 cm mass incarcerated left flank abdominal wall.  4 x 3 cm anterior rectus fascial defect.  3-1/2 x 2-1/2 cm deep financial transversalis defect.  Type of repair: Laparoscopic underlay repair   Placement of mesh: Intraperitoneal underlay repair  Name of mesh: Bard Ventralight dual sided (polypropylene / Seprafilm)  Size of mesh: 20x15cm  Orientation: Transverse  Mesh overlap:  5-7cm   DESCRIPTION:   Informed consent was confirmed. The patient underwent general anaesthesia without difficulty. The patient was positioned appropriately in left lateral decubitus position with beanbag arm sling boards to safely position the patient. VTE prevention in place. The patient's abdomen was clipped, prepped, & draped in a sterile fashion. Surgical timeout confirmed our plan.  The patient was positioned in reverse Trendelenburg. Abdominal entry was gained using optical entry technique in the left upper abdomen. Entry was clean. I induced carbon dioxide insufflation. Camera inspection revealed no injury. Extra ports were carefully placed under direct laparoscopic visualization.  I could  see adhesions on the prietal peritoneum under the abdominal wall.  I did laparoscopic lysis of adhesions to expose the entire anterior abdominal wall.  I primarily used focused sharp dissection.   The mid descending colon was coming up towards a hernia in the left lower quadrant at the anterior axillary line.  I had open of the fascia a little bit to reduce large swaths of epiploic appendages as well as some greater omentum down.  Eventually reduced it down.  It was sort of a spit Gallion type hernia with fascial defects in the deeper fascia and then a larger defect in the mid fascia.  I made sure hemostasis was good.  I mapped out the region using a needle passer.   To ensure that I would have at least 5 cm radial coverage outside of the hernia defect, I chose a 20x15cm dual sided mesh.  I placed #1 Prolene stitches around its edge about every 5 cm = 12 total.  I rolled the mesh & placed into the peritoneal cavity through the hernia defect.  I unrolled the mesh and positioned it appropriately.  I did mobilize the left colon in a lateral medial fashion to make sure the mesh would lay appropriately.  The lateral inferior rim did lay over the left iliac crest somewhat.  I secured the mesh to cover up the hernia defect using a laparoscopic suture passer to pass the tails of the Prolene through the abdominal wall & tagged them with clamps for good transfascial suturing.  I started out in four corners to make sure I had the mesh centered under the hernia defect appropriately, and then proceeded to work in quadrants.  I did have to replace it #1 Prolene sutures in the right superior circumference arc x2 as one stitch had pulled out.  We evacuated CO2 & desufflated the abdomen.  I tied the fascial stitches down. I closed the fascial defect that I placed the mesh through using #1 PDS interrupted transverse stitches primarily.  Closed the more deeper transversalis fascia.  Then closed the anterior rectus/external oblique  fascia.   I reinsufflated the abdomen. The mesh provided at least circumferential coverage around the entire region of hernia defects.  I secured the mesh centrally with an additional trans fascial stitch in & out the mesh using #1 PDS under laparoscopic visualization.   I tacked the edges & central part of the mesh to the peritoneum/posterior rectus fascia with SecureStrap absorbable tacks.   I did reinspection. Hemostasis was good. Mesh laid well. I completed a broad field block of local anesthesia at fascial stitch sites & fascial closure areas.    Capnoperitoneum was evacuated. Ports were removed. The skin was closed with Monocryl at the port sites and Steri-Strips on the fascial stitch puncture sites.  Patient is being extubated to go to the recovery room.  I discussed operative findings, updated the patient's status, discussed probable steps to recovery, and gave postoperative recommendations to the patient's family.  Recommendations were made.  Questions were answered.  They expressed understanding & appreciation.  Adin Hector, M.D., F.A.C.S. Gastrointestinal and Minimally Invasive Surgery [facility] [address] [place], [place] [address] [phone] Main / Paging  01/30/2017 2:15 PM

## 2017-01-30 NOTE — Progress Notes (Signed)
Assisted Dr. Brock with left, ultrasound guided, transabdominal plane block. Side rails up, monitors on throughout procedure. See vital signs in flow sheet. Tolerated Procedure well. 

## 2017-01-31 ENCOUNTER — Encounter (HOSPITAL_COMMUNITY): Payer: Self-pay | Admitting: Surgery

## 2017-01-31 DIAGNOSIS — K430 Incisional hernia with obstruction, without gangrene: Secondary | ICD-10-CM | POA: Diagnosis not present

## 2017-01-31 NOTE — Anesthesia Postprocedure Evaluation (Signed)
Anesthesia Post Note  Patient: Jeffery [name]  Procedure(s) Performed: LAPAROSCOPIC REPAIR LEFT FLANK INCARCERATED INCISIONAL HERNIA WITH MESH ERAS PATHWAY (Left Abdomen) INSERTION OF MESH (N/A )     Patient location during evaluation: PACU Anesthesia Type: General Level of consciousness: awake and alert Pain management: pain level controlled Vital Signs Assessment: post-procedure vital signs reviewed and stable Respiratory status: spontaneous breathing, nonlabored ventilation, respiratory function stable and patient connected to nasal cannula oxygen Cardiovascular status: blood pressure returned to baseline and stable Postop Assessment: no apparent nausea or vomiting Anesthetic complications: no    Last Vitals:  Vitals:   01/31/17 0202 01/31/17 0531  BP: (!) 127/59 134/72  Pulse: 63 (!) 57  Resp: 18 18  Temp: 36.6 C 36.9 C  SpO2: 100% 97%    Last Pain:  Vitals:   01/31/17 0531  TempSrc: Oral  PainSc:                  Audry Pili

## 2017-01-31 NOTE — Progress Notes (Signed)
Discharge instructions discussed with patient, verbalized agreement ?
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
## 2017-02-06 ENCOUNTER — Telehealth: Payer: Self-pay | Admitting: Cardiology

## 2017-02-06 DIAGNOSIS — I2510 Atherosclerotic heart disease of native coronary artery without angina pectoris: Secondary | ICD-10-CM

## 2017-02-06 NOTE — Telephone Encounter (Signed)
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

## 2017-02-06 NOTE — Telephone Encounter (Signed)
Returned call to pt-he states that he needs his DOT physical forms updated he will wait for Hilda Blades to call him back

## 2017-02-06 NOTE — Telephone Encounter (Signed)
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 He has recently had hernia surgery and is not able to walk on the treadmill. Order placed and testing scheduled.

## 2017-02-19 ENCOUNTER — Telehealth (HOSPITAL_COMMUNITY): Payer: Self-pay

## 2017-02-19 NOTE — Telephone Encounter (Signed)
Encounter complete. 

## 2017-02-21 ENCOUNTER — Ambulatory Visit (HOSPITAL_COMMUNITY)
Admission: RE | Admit: 2017-02-21 | Discharge: 2017-02-21 | Disposition: A | Discharge status: Home / Self Care | Payer: BLUE CROSS/BLUE SHIELD | Source: Ambulatory Visit | Attending: Cardiology | Admitting: Cardiology

## 2017-02-21 DIAGNOSIS — I252 Old myocardial infarction: Secondary | ICD-10-CM | POA: Diagnosis not present

## 2017-02-21 DIAGNOSIS — I10 Essential (primary) hypertension: Secondary | ICD-10-CM | POA: Insufficient documentation

## 2017-02-21 DIAGNOSIS — Z6836 Body mass index (BMI) 36.0-36.9, adult: Secondary | ICD-10-CM | POA: Diagnosis not present

## 2017-02-21 DIAGNOSIS — Z8249 Family history of ischemic heart disease and other diseases of the circulatory system: Secondary | ICD-10-CM | POA: Insufficient documentation

## 2017-02-21 DIAGNOSIS — I2510 Atherosclerotic heart disease of native coronary artery without angina pectoris: Secondary | ICD-10-CM

## 2017-02-21 DIAGNOSIS — Z8546 Personal history of malignant neoplasm of prostate: Secondary | ICD-10-CM | POA: Insufficient documentation

## 2017-02-21 DIAGNOSIS — E669 Obesity, unspecified: Secondary | ICD-10-CM | POA: Insufficient documentation

## 2017-02-21 DIAGNOSIS — K219 Gastro-esophageal reflux disease without esophagitis: Secondary | ICD-10-CM | POA: Insufficient documentation

## 2017-02-21 DIAGNOSIS — R9439 Abnormal result of other cardiovascular function study: Secondary | ICD-10-CM | POA: Diagnosis not present

## 2017-02-21 LAB — MYOCARDIAL PERFUSION IMAGING
LV dias vol: 140 mL (ref 62–150)
LV sys vol: 72 mL
Peak HR: 85 {beats}/min
Rest HR: 51 {beats}/min
SDS: 6
SRS: 4
SSS: 10
TID: 0.99

## 2017-02-21 MED ORDER — TECHNETIUM TC 99M TETROFOSMIN IV KIT
10.5000 | PACK | Freq: Once | INTRAVENOUS | Status: AC | PRN
  Administered 2017-02-21: 10.5 via INTRAVENOUS
  Filled 2017-02-21: qty 11

## 2017-02-21 MED ORDER — TECHNETIUM TC 99M TETROFOSMIN IV KIT
29.5000 | PACK | Freq: Once | INTRAVENOUS | Status: AC | PRN
  Administered 2017-02-21: 29.5 via INTRAVENOUS
  Filled 2017-02-21: qty 30

## 2017-02-21 MED ORDER — REGADENOSON 0.4 MG/5ML IV SOLN
0.4000 mg | Freq: Once | INTRAVENOUS | Status: AC
  Administered 2017-02-21: 0.4 mg via INTRAVENOUS

## 2017-02-25 ENCOUNTER — Encounter: Payer: Self-pay | Admitting: *Deleted

## 2017-03-27 ENCOUNTER — Other Ambulatory Visit: Payer: Self-pay | Admitting: Cardiology

## 2017-03-27 DIAGNOSIS — I2510 Atherosclerotic heart disease of native coronary artery without angina pectoris: Secondary | ICD-10-CM

## 2017-03-28 NOTE — Telephone Encounter (Signed)
Rx has been sent to the pharmacy electronically. ° °

## 2017-04-22 ENCOUNTER — Other Ambulatory Visit: Payer: Self-pay | Admitting: Physician Assistant

## 2017-04-22 NOTE — Telephone Encounter (Signed)
Please review for refill, Thanks !  

## 2017-05-01 ENCOUNTER — Encounter: Payer: Self-pay | Admitting: Podiatry

## 2017-05-01 ENCOUNTER — Ambulatory Visit (INDEPENDENT_AMBULATORY_CARE_PROVIDER_SITE_OTHER): Payer: Worker's Compensation

## 2017-05-01 ENCOUNTER — Ambulatory Visit (INDEPENDENT_AMBULATORY_CARE_PROVIDER_SITE_OTHER): Payer: Worker's Compensation | Admitting: Podiatry

## 2017-05-01 DIAGNOSIS — S99922A Unspecified injury of left foot, initial encounter: Secondary | ICD-10-CM

## 2017-05-02 NOTE — Progress Notes (Signed)
Subjective:   Patient ID: Jeffery [name], male   DOB: 67 y.o.   MRN: [id number]   HPI Patient states he tripped on his foot jammed his toe and it became red and irritated and is concerned about fracture or nail disease   ROS      Objective:  Physical Exam  Neurovascular status intact with traumatized left hallux secondary to injury with yellow discolored nail that is thickened     Assessment:  Trauma to the left hallux with possibility for fracture of the nail damage     Plan:  X-ray taken reviewed and at this point just could not have him soak it bandage it and if the nail should become loose and will need to be removed in the future  X-ray indicates that there is no sign of fracture with moderate arthritis around the big toe joint left with previous surgery but overall is functioning well

## 2017-06-01 ENCOUNTER — Other Ambulatory Visit: Payer: Self-pay | Admitting: Cardiology

## 2017-10-13 ENCOUNTER — Other Ambulatory Visit: Payer: Self-pay | Admitting: Cardiology

## 2017-10-13 NOTE — Telephone Encounter (Signed)
Rx request sent to pharmacy.  

## 2017-12-03 ENCOUNTER — Other Ambulatory Visit: Payer: Self-pay | Admitting: *Deleted

## 2017-12-03 DIAGNOSIS — E7800 Pure hypercholesterolemia, unspecified: Secondary | ICD-10-CM

## 2017-12-14 ENCOUNTER — Other Ambulatory Visit: Payer: Self-pay | Admitting: Cardiology

## 2017-12-19 ENCOUNTER — Other Ambulatory Visit: Payer: Self-pay
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
## 2018-01-12 ENCOUNTER — Other Ambulatory Visit: Payer: BLUE CROSS/BLUE SHIELD | Admitting: *Deleted

## 2018-01-12 ENCOUNTER — Encounter (INDEPENDENT_AMBULATORY_CARE_PROVIDER_SITE_OTHER): Payer: Self-pay

## 2018-01-12 DIAGNOSIS — E7800 Pure hypercholesterolemia, unspecified: Secondary | ICD-10-CM

## 2018-01-12 LAB — LIPID PANEL
Chol/HDL Ratio: 2.2 ratio (ref 0.0–5.0)
Cholesterol, Total: 138 mg/dL (ref 100–199)
HDL: 63 mg/dL (ref >39)
LDL Calculated: 62 mg/dL (ref 0–99)
Triglycerides: 63 mg/dL (ref 0–149)
VLDL Cholesterol Cal: 13 mg/dL (ref 5–40)

## 2018-01-12 LAB — COMPREHENSIVE METABOLIC PANEL
ALT: 19 IU/L (ref 0–44)
AST: 19 IU/L (ref 0–40)
Albumin/Globulin Ratio: 1.9 (ref 1.2–2.2)
Albumin: 3.9 g/dL (ref 3.6–4.8)
Alkaline Phosphatase: 70 IU/L (ref 39–117)
BUN/Creatinine Ratio: 19 (ref 10–24)
BUN: 17 mg/dL (ref 8–27)
Bilirubin Total: 0.4 mg/dL (ref 0.0–1.2)
CO2: 25 mmol/L (ref 20–29)
Calcium: 9.1 mg/dL (ref 8.6–10.2)
Chloride: 102 mmol/L (ref 96–106)
Creatinine, Ser: 0.88 mg/dL (ref 0.76–1.27)
GFR calc Af Amer: 103 mL/min/{1.73_m2} (ref >59)
GFR calc non Af Amer: 89 mL/min/{1.73_m2} (ref >59)
Globulin, Total: 2.1 g/dL (ref 1.5–4.5)
Glucose: 101 mg/dL — ABNORMAL HIGH (ref 65–99)
Potassium: 4.6 mmol/L (ref 3.5–5.2)
Sodium: 141 mmol/L (ref 134–144)
Total Protein: 6 g/dL (ref 6.0–8.5)

## 2018-01-19 ENCOUNTER — Other Ambulatory Visit: Payer: Self-pay

## 2018-01-19 ENCOUNTER — Encounter: Payer: Self-pay | Admitting: Cardiology

## 2018-01-19 ENCOUNTER — Ambulatory Visit: Payer: BLUE CROSS/BLUE SHIELD | Admitting: Cardiology

## 2018-01-19 VITALS — BP 128/62 | HR 63 | Ht 69.0 in | Wt 258.0 lb

## 2018-01-19 DIAGNOSIS — E7800 Pure hypercholesterolemia, unspecified: Secondary | ICD-10-CM | POA: Diagnosis not present

## 2018-01-19 DIAGNOSIS — I10 Essential (primary) hypertension: Secondary | ICD-10-CM | POA: Diagnosis not present

## 2018-01-19 DIAGNOSIS — I2510 Atherosclerotic heart disease of native coronary artery without angina pectoris: Secondary | ICD-10-CM | POA: Diagnosis not present

## 2018-01-19 NOTE — Patient Instructions (Signed)

## 2018-02-03 ENCOUNTER — Other Ambulatory Visit: Payer: Self-pay | Admitting: Cardiology

## 2018-02-03 MED ORDER — LOSARTAN POTASSIUM 100 MG PO TABS: 100.0000 mg | ORAL_TABLET | Freq: Every day | ORAL | 3 refills | Status: DC

## 2018-02-03 NOTE — Telephone Encounter (Signed)
New Message     *STAT* If patient is at the pharmacy, call can be transferred to refill team.   1. Which medications need to be refilled? (please list name of each medication and dose if known) Losartan 100mg    2. Which pharmacy/location (including street and city if local pharmacy) is medication to be sent to? [facility] on [address]  3. Do they need a 30 day or 90 day supply? 90 day supply     Patient states Dr. Stanford Breed didn't prescribe the medication but he just seen Dr. Stanford Breed so he knows all about the medication and wants Dr. Stanford Breed to start prescribing it to him.

## 2018-02-03 NOTE — Telephone Encounter (Signed)
Losartan 100mg  is current on med list. Was on med list at Jan 2020 MD OV. Rx(s) sent to pharmacy electronically.

## 2018-02-14 ENCOUNTER — Other Ambulatory Visit: Payer: Self-pay | Admitting: Cardiology

## 2018-02-14 DIAGNOSIS — I2510 Atherosclerotic heart disease of native coronary artery without angina pectoris: Secondary | ICD-10-CM

## 2018-03-06 ENCOUNTER — Ambulatory Visit (INDEPENDENT_AMBULATORY_CARE_PROVIDER_SITE_OTHER): Payer: Worker's Compensation | Admitting: Podiatry

## 2018-03-06 ENCOUNTER — Encounter: Payer: Self-pay | Admitting: Podiatry

## 2018-03-06 ENCOUNTER — Ambulatory Visit (INDEPENDENT_AMBULATORY_CARE_PROVIDER_SITE_OTHER): Payer: Worker's Compensation

## 2018-03-06 ENCOUNTER — Other Ambulatory Visit: Payer: Self-pay | Admitting: Podiatry

## 2018-03-06 DIAGNOSIS — M205X2 Other deformities of toe(s) (acquired), left foot: Secondary | ICD-10-CM | POA: Diagnosis not present

## 2018-03-06 DIAGNOSIS — M79675 Pain in left toe(s): Secondary | ICD-10-CM

## 2018-03-06 DIAGNOSIS — L600 Ingrowing nail: Secondary | ICD-10-CM

## 2018-03-06 MED ORDER — NEOMYCIN-POLYMYXIN-HC 3.5-10000-1 OT SOLN: OTIC | 0 refills | Status: DC

## 2018-03-06 NOTE — Patient Instructions (Signed)

## 2018-03-07 NOTE — Progress Notes (Signed)
Subjective:   Patient ID: Jeffery [name], male   DOB: 68 y.o.   MRN: [id number]   HPI Patient had a trauma at work with his left big toe and his left toenail was damaged and it now has become thick and incurvated and he cannot wear shoe gear comfortably   ROS      Objective:  Physical Exam  Neurovascular status intact with patient's left hallux nail found to be very thickened and dystrophic and painful with palpation secondary to previous injury at work.  Patient's toe itself is moderately swollen     Assessment:  Damaged left hallux nail from previous injury which grew back abnormal with inflammation of the toe itself     Plan:  H&P precautionary x-ray taken reviewed and today I recommended nail removal permanently.  I explained procedure and risk and patient wants surgery and understands will be permanent and understands risk and signed consent form.  Today I went ahead and infiltrated 60 mg like Marcaine mixture sterile prep applied to the toe and using sterile instrumentation I remove the hallux nail exposed matrix and applied phenol 3 applications 30 seconds followed by alcohol lavage and sterile dressing.  Gave instructions on soaks and leaving dressing on 24 hours but taking it off earlier if issues should occur and also I wrote prescription for drops to use into the bed to help reduce drainage and infection.  Encourage patient to call with any issues

## 2018-03-24 DIAGNOSIS — J019 Acute sinusitis, unspecified: Secondary | ICD-10-CM | POA: Diagnosis not present

## 2018-03-24 DIAGNOSIS — R05 Cough: Secondary | ICD-10-CM | POA: Diagnosis not present

## 2018-03-25 DIAGNOSIS — M47816 Spondylosis without myelopathy or radiculopathy, lumbar region: Secondary | ICD-10-CM | POA: Diagnosis not present

## 2018-03-25 DIAGNOSIS — M48061 Spinal stenosis, lumbar region without neurogenic claudication: Secondary | ICD-10-CM | POA: Diagnosis not present

## 2018-03-25 DIAGNOSIS — M5417 Radiculopathy, lumbosacral region: Secondary | ICD-10-CM | POA: Diagnosis not present

## 2018-03-29 ENCOUNTER — Other Ambulatory Visit: Payer: Self-pay | Admitting: Cardiology

## 2018-05-29 DIAGNOSIS — R0981 Nasal congestion: Secondary | ICD-10-CM | POA: Diagnosis not present

## 2018-05-29 DIAGNOSIS — M545 Low back pain: Secondary | ICD-10-CM | POA: Diagnosis not present

## 2018-05-29 DIAGNOSIS — K219 Gastro-esophageal reflux disease without esophagitis: Secondary | ICD-10-CM | POA: Diagnosis not present

## 2018-05-29 DIAGNOSIS — G479 Sleep disorder, unspecified: Secondary | ICD-10-CM | POA: Diagnosis not present

## 2018-05-29 DIAGNOSIS — L719 Rosacea, unspecified: Secondary | ICD-10-CM | POA: Diagnosis not present

## 2018-06-06 ENCOUNTER — Other Ambulatory Visit: Payer: Self-pay | Admitting: Cardiology

## 2018-06-26 DIAGNOSIS — M47816 Spondylosis without myelopathy or radiculopathy, lumbar region: Secondary | ICD-10-CM | POA: Diagnosis not present

## 2018-06-29 DIAGNOSIS — K219 Gastro-esophageal reflux disease without esophagitis: Secondary | ICD-10-CM | POA: Diagnosis not present

## 2018-06-29 DIAGNOSIS — I10 Essential (primary) hypertension: Secondary | ICD-10-CM | POA: Diagnosis not present

## 2018-06-29 DIAGNOSIS — M4800 Spinal stenosis, site unspecified: Secondary | ICD-10-CM | POA: Diagnosis not present

## 2018-06-29 DIAGNOSIS — Z0000 Encounter for general adult medical examination without abnormal findings: Secondary | ICD-10-CM | POA: Diagnosis not present

## 2018-06-29 DIAGNOSIS — N529 Male erectile dysfunction, unspecified: Secondary | ICD-10-CM | POA: Diagnosis not present

## 2018-06-29 DIAGNOSIS — R0981 Nasal congestion: Secondary | ICD-10-CM | POA: Diagnosis not present

## 2018-06-29 DIAGNOSIS — L719 Rosacea, unspecified: Secondary | ICD-10-CM | POA: Diagnosis not present

## 2018-06-29 DIAGNOSIS — E7800 Pure hypercholesterolemia, unspecified: Secondary | ICD-10-CM | POA: Diagnosis not present

## 2018-06-29 DIAGNOSIS — G479 Sleep disorder, unspecified: Secondary | ICD-10-CM | POA: Diagnosis not present

## 2018-07-06 DIAGNOSIS — M48061 Spinal stenosis, lumbar region without neurogenic claudication: Secondary | ICD-10-CM | POA: Diagnosis not present

## 2018-07-06 DIAGNOSIS — M47816 Spondylosis without myelopathy or radiculopathy, lumbar region: Secondary | ICD-10-CM | POA: Diagnosis not present

## 2018-07-06 DIAGNOSIS — M5417 Radiculopathy, lumbosacral region: Secondary | ICD-10-CM | POA: Diagnosis not present

## 2018-07-27 DIAGNOSIS — C61 Malignant neoplasm of prostate: Secondary | ICD-10-CM | POA: Diagnosis not present

## 2018-08-03 DIAGNOSIS — R311 Benign essential microscopic hematuria: Secondary | ICD-10-CM | POA: Diagnosis not present

## 2018-08-03 DIAGNOSIS — Z8546 Personal history of malignant neoplasm of prostate: Secondary | ICD-10-CM | POA: Diagnosis not present

## 2018-08-03 DIAGNOSIS — N5231 Erectile dysfunction following radical prostatectomy: Secondary | ICD-10-CM | POA: Diagnosis not present

## 2018-08-10 DIAGNOSIS — K5730 Diverticulosis of large intestine without perforation or abscess without bleeding: Secondary | ICD-10-CM | POA: Diagnosis not present

## 2018-08-10 DIAGNOSIS — R3129 Other microscopic hematuria: Secondary | ICD-10-CM | POA: Diagnosis not present

## 2018-08-10 DIAGNOSIS — R311 Benign essential microscopic hematuria: Secondary | ICD-10-CM | POA: Diagnosis not present

## 2018-08-21 DIAGNOSIS — R21 Rash and other nonspecific skin eruption: Secondary | ICD-10-CM | POA: Diagnosis not present

## 2018-09-07 DIAGNOSIS — R311 Benign essential microscopic hematuria: Secondary | ICD-10-CM | POA: Diagnosis not present

## 2018-09-12 ENCOUNTER — Emergency Department (HOSPITAL_BASED_OUTPATIENT_CLINIC_OR_DEPARTMENT_OTHER)
Admission: EM | Admit: 2018-09-12 | Discharge: 2018-09-12 | Disposition: A | Discharge status: Home / Self Care | Payer: PPO | Attending: Emergency Medicine | Admitting: Emergency Medicine

## 2018-09-12 ENCOUNTER — Encounter (HOSPITAL_BASED_OUTPATIENT_CLINIC_OR_DEPARTMENT_OTHER): Payer: Self-pay | Admitting: Emergency Medicine

## 2018-09-12 ENCOUNTER — Other Ambulatory Visit: Payer: Self-pay

## 2018-09-12 ENCOUNTER — Emergency Department (HOSPITAL_BASED_OUTPATIENT_CLINIC_OR_DEPARTMENT_OTHER): Payer: PPO

## 2018-09-12 DIAGNOSIS — M79662 Pain in left lower leg: Secondary | ICD-10-CM

## 2018-09-12 DIAGNOSIS — Z87891 Personal history of nicotine dependence: Secondary | ICD-10-CM | POA: Diagnosis not present

## 2018-09-12 DIAGNOSIS — I10 Essential (primary) hypertension: Secondary | ICD-10-CM | POA: Insufficient documentation

## 2018-09-12 DIAGNOSIS — I25110 Atherosclerotic heart disease of native coronary artery with unstable angina pectoris: Secondary | ICD-10-CM | POA: Insufficient documentation

## 2018-09-12 DIAGNOSIS — Z79899 Other long term (current) drug therapy: Secondary | ICD-10-CM | POA: Insufficient documentation

## 2018-09-12 DIAGNOSIS — R2242 Localized swelling, mass and lump, left lower limb: Secondary | ICD-10-CM | POA: Diagnosis not present

## 2018-09-12 DIAGNOSIS — Z8546 Personal history of malignant neoplasm of prostate: Secondary | ICD-10-CM | POA: Insufficient documentation

## 2018-09-12 DIAGNOSIS — I252 Old myocardial infarction: Secondary | ICD-10-CM | POA: Diagnosis not present

## 2018-09-12 DIAGNOSIS — Z7982 Long term (current) use of aspirin: Secondary | ICD-10-CM | POA: Diagnosis not present

## 2018-09-12 DIAGNOSIS — Z955 Presence of coronary angioplasty implant and graft: Secondary | ICD-10-CM | POA: Insufficient documentation

## 2018-09-12 DIAGNOSIS — L03116 Cellulitis of left lower limb: Secondary | ICD-10-CM | POA: Insufficient documentation

## 2018-09-12 MED ORDER — CEPHALEXIN 500 MG PO CAPS: 1000.0000 mg | ORAL_CAPSULE | Freq: Two times a day (BID) | ORAL | 0 refills | Status: DC

## 2018-09-12 NOTE — ED Notes (Signed)
ED Provider at bedside. 

## 2018-09-12 NOTE — ED Triage Notes (Signed)
Pt reports left leg swelling and pain for several days. He was sent here by [facility] for evaluation for blood clot
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
## 2018-09-12 NOTE — Discharge Instructions (Signed)
1.  Try to elevate your leg as much as possible. 2.  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 3.  Schedule a recheck with your doctor in the next 4 to 5 days.  Your ultrasound was negative for clot in the leg.  Your symptoms however are suspicious and sometimes an ultrasound is repeated about a week after the first 1 to make sure a very small clot was not developing. 4.  Return to the emergency department if your pain, swelling are worsening or if you develop chest pain or shortness of breath.

## 2018-09-12 NOTE — ED Notes (Signed)
Patient transported to US 

## 2018-09-17 ENCOUNTER — Other Ambulatory Visit (HOSPITAL_BASED_OUTPATIENT_CLINIC_OR_DEPARTMENT_OTHER): Payer: Self-pay | Admitting: Family Medicine

## 2018-09-17 DIAGNOSIS — M79606 Pain in leg, unspecified: Secondary | ICD-10-CM | POA: Diagnosis not present

## 2018-09-17 DIAGNOSIS — M7989 Other specified soft tissue disorders: Secondary | ICD-10-CM

## 2018-09-17 DIAGNOSIS — Z23 Encounter for immunization: Secondary | ICD-10-CM | POA: Diagnosis not present

## 2018-09-18 ENCOUNTER — Other Ambulatory Visit: Payer: Self-pay

## 2018-09-18 ENCOUNTER — Other Ambulatory Visit (HOSPITAL_BASED_OUTPATIENT_CLINIC_OR_DEPARTMENT_OTHER): Payer: Self-pay

## 2018-09-18 ENCOUNTER — Ambulatory Visit (HOSPITAL_BASED_OUTPATIENT_CLINIC_OR_DEPARTMENT_OTHER)
Admission: RE | Admit: 2018-09-18 | Discharge: 2018-09-18 | Disposition: A | Discharge status: Home / Self Care | Payer: PPO | Source: Ambulatory Visit | Attending: Family Medicine | Admitting: Family Medicine

## 2018-09-18 ENCOUNTER — Other Ambulatory Visit (HOSPITAL_BASED_OUTPATIENT_CLINIC_OR_DEPARTMENT_OTHER): Payer: Self-pay | Admitting: Family Medicine

## 2018-09-18 ENCOUNTER — Ambulatory Visit (HOSPITAL_BASED_OUTPATIENT_CLINIC_OR_DEPARTMENT_OTHER): Payer: Self-pay

## 2018-09-18 DIAGNOSIS — R2242 Localized swelling, mass and lump, left lower limb: Secondary | ICD-10-CM | POA: Diagnosis not present

## 2018-09-18 DIAGNOSIS — M7989 Other specified soft tissue disorders: Secondary | ICD-10-CM | POA: Diagnosis not present

## 2018-09-18 DIAGNOSIS — M79662 Pain in left lower leg: Secondary | ICD-10-CM | POA: Diagnosis not present

## 2018-11-25 DIAGNOSIS — H00012 Hordeolum externum right lower eyelid: Secondary | ICD-10-CM | POA: Diagnosis not present

## 2018-12-08 ENCOUNTER — Telehealth: Payer: Self-pay | Admitting: Cardiology

## 2018-12-08 DIAGNOSIS — E7800 Pure hypercholesterolemia, unspecified: Secondary | ICD-10-CM

## 2018-12-08 DIAGNOSIS — I10 Essential (primary) hypertension: Secondary | ICD-10-CM

## 2018-12-08 NOTE — Telephone Encounter (Signed)
Called patient- he would like his blood work to go ahead and be ordered-  He will call [address] tomorrow to make lab appointment a week before his appointment with Dr.Crenshaw- but needs orders placed.  Advised I would send a message to Hilda Blades and she would get it ordered for him. Patient thankful for call.

## 2018-12-08 NOTE — Telephone Encounter (Signed)
Lab orders placed.  

## 2018-12-08 NOTE — Telephone Encounter (Signed)
Patient is calling stating he would like to speak with Hilda Blades in regards to getting a blood work appointment at [facility] a week before his 1 y f/u on 02/01/18 if possible. Please advise.

## 2018-12-14 ENCOUNTER — Other Ambulatory Visit: Payer: Self-pay | Admitting: *Deleted

## 2018-12-14 DIAGNOSIS — I2510 Atherosclerotic heart disease of native coronary artery without angina pectoris: Secondary | ICD-10-CM

## 2018-12-29 ENCOUNTER — Encounter: Payer: Self-pay | Admitting: Cardiology

## 2018-12-29 ENCOUNTER — Other Ambulatory Visit: Payer: Self-pay | Admitting: *Deleted

## 2018-12-29 MED ORDER — CARVEDILOL 12.5 MG PO TABS: 12.5000 mg | ORAL_TABLET | Freq: Two times a day (BID) | ORAL | 3 refills | Status: DC

## 2018-12-29 NOTE — Telephone Encounter (Signed)
This encounter was created in error - please disregard.

## 2018-12-29 NOTE — Telephone Encounter (Signed)
New Message  Pt would like to talk with Dr. Jacalyn Lefevre nurse, Fredia Beets.   Please return phone call

## 2019-01-06 DIAGNOSIS — I2510 Atherosclerotic heart disease of native coronary artery without angina pectoris: Secondary | ICD-10-CM | POA: Diagnosis not present

## 2019-01-06 DIAGNOSIS — I10 Essential (primary) hypertension: Secondary | ICD-10-CM | POA: Diagnosis not present

## 2019-01-06 DIAGNOSIS — E7800 Pure hypercholesterolemia, unspecified: Secondary | ICD-10-CM | POA: Diagnosis not present

## 2019-01-06 DIAGNOSIS — Z8546 Personal history of malignant neoplasm of prostate: Secondary | ICD-10-CM | POA: Diagnosis not present

## 2019-01-07 ENCOUNTER — Telehealth (HOSPITAL_COMMUNITY): Payer: Self-pay | Admitting: *Deleted

## 2019-01-07 NOTE — Telephone Encounter (Signed)
Patient given detailed instructions per Myocardial Perfusion Study Information Sheet for the test on 01/10/18 at 7:30. Patient notified to arrive 15 minutes early and that it is imperative to arrive on time for appointment to keep from having the test rescheduled.  If you need to cancel or reschedule your appointment, please call the office within 24 hours of your appointment. . Patient verbalized understanding.Jeffery [name]

## 2019-01-11 ENCOUNTER — Other Ambulatory Visit: Payer: Self-pay

## 2019-01-11 ENCOUNTER — Ambulatory Visit (HOSPITAL_COMMUNITY): Payer: PPO | Attending: Cardiology

## 2019-01-11 ENCOUNTER — Encounter (INDEPENDENT_AMBULATORY_CARE_PROVIDER_SITE_OTHER): Payer: Self-pay

## 2019-01-11 DIAGNOSIS — I2510 Atherosclerotic heart disease of native coronary artery without angina pectoris: Secondary | ICD-10-CM

## 2019-01-11 LAB — MYOCARDIAL PERFUSION IMAGING
LV dias vol: 161 mL (ref 62–150)
LV sys vol: 86 mL
Peak HR: 96 {beats}/min
Rest HR: 56 {beats}/min
SDS: 0
SRS: 2
SSS: 2
TID: 1

## 2019-01-11 MED ORDER — TECHNETIUM TC 99M TETROFOSMIN IV KIT
31.7000 | PACK | Freq: Once | INTRAVENOUS | Status: AC | PRN
  Administered 2019-01-11: 31.7 via INTRAVENOUS
  Filled 2019-01-11: qty 32

## 2019-01-11 MED ORDER — TECHNETIUM TC 99M TETROFOSMIN IV KIT
10.9000 | PACK | Freq: Once | INTRAVENOUS | Status: AC | PRN
  Administered 2019-01-11: 10.9 via INTRAVENOUS
  Filled 2019-01-11: qty 11

## 2019-01-11 MED ORDER — REGADENOSON 0.4 MG/5ML IV SOLN
0.4000 mg | Freq: Once | INTRAVENOUS | Status: AC
  Administered 2019-01-11: 0.4 mg via INTRAVENOUS

## 2019-01-18 ENCOUNTER — Other Ambulatory Visit: Payer: PPO | Admitting: *Deleted

## 2019-01-18 ENCOUNTER — Other Ambulatory Visit: Payer: Self-pay

## 2019-01-18 ENCOUNTER — Encounter: Payer: Self-pay | Admitting: *Deleted

## 2019-01-18 DIAGNOSIS — I10 Essential (primary) hypertension: Secondary | ICD-10-CM | POA: Diagnosis not present

## 2019-01-18 DIAGNOSIS — E7800 Pure hypercholesterolemia, unspecified: Secondary | ICD-10-CM

## 2019-01-18 LAB — COMPREHENSIVE METABOLIC PANEL
ALT: 15 IU/L (ref 0–44)
AST: 18 IU/L (ref 0–40)
Albumin/Globulin Ratio: 2 (ref 1.2–2.2)
Albumin: 4 g/dL (ref 3.8–4.8)
Alkaline Phosphatase: 68 IU/L (ref 39–117)
BUN/Creatinine Ratio: 14 (ref 10–24)
BUN: 14 mg/dL (ref 8–27)
Bilirubin Total: 0.3 mg/dL (ref 0.0–1.2)
CO2: 26 mmol/L (ref 20–29)
Calcium: 9 mg/dL (ref 8.6–10.2)
Chloride: 105 mmol/L (ref 96–106)
Creatinine, Ser: 1.03 mg/dL (ref 0.76–1.27)
GFR calc Af Amer: 86 mL/min/{1.73_m2} (ref >59)
GFR calc non Af Amer: 74 mL/min/{1.73_m2} (ref >59)
Globulin, Total: 2 g/dL (ref 1.5–4.5)
Glucose: 106 mg/dL — ABNORMAL HIGH (ref 65–99)
Potassium: 4.9 mmol/L (ref 3.5–5.2)
Sodium: 142 mmol/L (ref 134–144)
Total Protein: 6 g/dL (ref 6.0–8.5)

## 2019-01-18 LAB — LIPID PANEL
Chol/HDL Ratio: 1.9 ratio (ref 0.0–5.0)
Cholesterol, Total: 113 mg/dL (ref 100–199)
HDL: 58 mg/dL (ref >39)
LDL Chol Calc (NIH): 43 mg/dL (ref 0–99)
Triglycerides: 49 mg/dL (ref 0–149)
VLDL Cholesterol Cal: 12 mg/dL (ref 5–40)
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
## 2019-02-02 ENCOUNTER — Encounter: Payer: Self-pay | Admitting: Cardiology

## 2019-02-02 ENCOUNTER — Other Ambulatory Visit: Payer: Self-pay

## 2019-02-02 ENCOUNTER — Ambulatory Visit: Payer: PPO | Admitting: Cardiology

## 2019-02-02 VITALS — BP 132/74 | HR 69 | Ht 69.5 in | Wt 264.0 lb

## 2019-02-02 DIAGNOSIS — E7800 Pure hypercholesterolemia, unspecified: Secondary | ICD-10-CM

## 2019-02-02 DIAGNOSIS — I2510 Atherosclerotic heart disease of native coronary artery without angina pectoris: Secondary | ICD-10-CM

## 2019-02-02 DIAGNOSIS — I10 Essential (primary) hypertension: Secondary | ICD-10-CM | POA: Diagnosis not present

## 2019-02-02 DIAGNOSIS — Z8546 Personal history of malignant neoplasm of prostate: Secondary | ICD-10-CM | POA: Diagnosis not present

## 2019-02-02 NOTE — Patient Instructions (Signed)

## 2019-03-01 ENCOUNTER — Other Ambulatory Visit: Payer: Self-pay

## 2019-03-01 MED ORDER — AMLODIPINE BESYLATE 5 MG PO TABS: ORAL_TABLET | ORAL | 3 refills | Status: DC

## 2019-03-19 DIAGNOSIS — H1132 Conjunctival hemorrhage, left eye: Secondary | ICD-10-CM | POA: Diagnosis not present

## 2019-03-19 DIAGNOSIS — H5712 Ocular pain, left eye: Secondary | ICD-10-CM | POA: Diagnosis not present

## 2019-03-30 DIAGNOSIS — I2510 Atherosclerotic heart disease of native coronary artery without angina pectoris: Secondary | ICD-10-CM | POA: Diagnosis not present

## 2019-03-30 DIAGNOSIS — E7800 Pure hypercholesterolemia, unspecified: Secondary | ICD-10-CM | POA: Diagnosis not present

## 2019-03-30 DIAGNOSIS — Z8546 Personal history of malignant neoplasm of prostate: Secondary | ICD-10-CM | POA: Diagnosis not present

## 2019-03-30 DIAGNOSIS — I10 Essential (primary) hypertension: Secondary | ICD-10-CM | POA: Diagnosis not present

## 2019-05-04 ENCOUNTER — Telehealth: Payer: Self-pay | Admitting: *Deleted

## 2019-05-04 MED ORDER — AMLODIPINE BESYLATE 10 MG PO TABS: ORAL_TABLET | ORAL | 3 refills | Status: DC

## 2019-05-04 NOTE — Telephone Encounter (Signed)
Follow Up  Patient returning call. Transferred to [facility].

## 2019-05-04 NOTE — Telephone Encounter (Signed)
Spoke with pt, Aware of dr crenshaw's recommendations.  °

## 2019-05-04 NOTE — Telephone Encounter (Signed)
Left message for pt to call , we received a copy of blood pressure readings from the month of April, dr Stanford Breed has reviewed and recommends the patient increase the amlodipine to 10 mg once daily.

## 2019-05-06 DIAGNOSIS — M25561 Pain in right knee: Secondary | ICD-10-CM | POA: Diagnosis not present

## 2019-05-08 ENCOUNTER — Other Ambulatory Visit: Payer: Self-pay | Admitting: Cardiology

## 2019-05-08 DIAGNOSIS — I2510 Atherosclerotic heart disease of native coronary artery without angina pectoris: Secondary | ICD-10-CM

## 2019-05-19 ENCOUNTER — Other Ambulatory Visit: Payer: Self-pay | Admitting: Orthopedic Surgery

## 2019-05-19 DIAGNOSIS — M25561 Pain in right knee: Secondary | ICD-10-CM

## 2019-05-27 ENCOUNTER — Other Ambulatory Visit: Payer: Self-pay

## 2019-05-27 ENCOUNTER — Ambulatory Visit
Admission: RE | Admit: 2019-05-27 | Discharge: 2019-05-27 | Disposition: A | Discharge status: Home / Self Care | Payer: PPO | Source: Ambulatory Visit | Attending: Orthopedic Surgery | Admitting: Orthopedic Surgery

## 2019-05-27 DIAGNOSIS — M25561 Pain in right knee: Secondary | ICD-10-CM

## 2019-05-28 DIAGNOSIS — S82134A Nondisplaced fracture of medial condyle of right tibia, initial encounter for closed fracture: Secondary | ICD-10-CM | POA: Diagnosis not present

## 2019-05-28 DIAGNOSIS — S72411A Displaced unspecified condyle fracture of lower end of right femur, initial encounter for closed fracture: Secondary | ICD-10-CM | POA: Diagnosis not present

## 2019-06-02 ENCOUNTER — Other Ambulatory Visit: Payer: Self-pay | Admitting: Cardiology

## 2019-06-04 DIAGNOSIS — E7800 Pure hypercholesterolemia, unspecified: Secondary | ICD-10-CM | POA: Diagnosis not present

## 2019-06-04 DIAGNOSIS — Z8546 Personal history of malignant neoplasm of prostate: Secondary | ICD-10-CM | POA: Diagnosis not present

## 2019-06-04 DIAGNOSIS — I10 Essential (primary) hypertension: Secondary | ICD-10-CM | POA: Diagnosis not present

## 2019-06-04 DIAGNOSIS — I2510 Atherosclerotic heart disease of native coronary artery without angina pectoris: Secondary | ICD-10-CM | POA: Diagnosis not present

## 2019-06-12 ENCOUNTER — Other Ambulatory Visit: Payer: Self-pay | Admitting: Cardiology

## 2019-06-17 DIAGNOSIS — S72411A Displaced unspecified condyle fracture of lower end of right femur, initial encounter for closed fracture: Secondary | ICD-10-CM | POA: Diagnosis not present

## 2019-06-17 DIAGNOSIS — S83206A Unspecified tear of unspecified meniscus, current injury, right knee, initial encounter: Secondary | ICD-10-CM | POA: Diagnosis not present

## 2019-07-07 DIAGNOSIS — I10 Essential (primary) hypertension: Secondary | ICD-10-CM | POA: Diagnosis not present

## 2019-07-07 DIAGNOSIS — I2510 Atherosclerotic heart disease of native coronary artery without angina pectoris: Secondary | ICD-10-CM | POA: Diagnosis not present

## 2019-07-07 DIAGNOSIS — E7800 Pure hypercholesterolemia, unspecified: Secondary | ICD-10-CM | POA: Diagnosis not present

## 2019-07-07 DIAGNOSIS — Z8546 Personal history of malignant neoplasm of prostate: Secondary | ICD-10-CM | POA: Diagnosis not present

## 2019-07-15 DIAGNOSIS — S72411A Displaced unspecified condyle fracture of lower end of right femur, initial encounter for closed fracture: Secondary | ICD-10-CM | POA: Diagnosis not present

## 2019-07-15 DIAGNOSIS — S83206A Unspecified tear of unspecified meniscus, current injury, right knee, initial encounter: Secondary | ICD-10-CM | POA: Diagnosis not present

## 2019-08-02 DIAGNOSIS — R311 Benign essential microscopic hematuria: Secondary | ICD-10-CM | POA: Diagnosis not present

## 2019-08-09 DIAGNOSIS — R311 Benign essential microscopic hematuria: Secondary | ICD-10-CM | POA: Diagnosis not present

## 2019-08-09 DIAGNOSIS — Z8546 Personal history of malignant neoplasm of prostate: Secondary | ICD-10-CM | POA: Diagnosis not present

## 2019-08-09 DIAGNOSIS — N5231 Erectile dysfunction following radical prostatectomy: Secondary | ICD-10-CM | POA: Diagnosis not present

## 2019-08-31 ENCOUNTER — Other Ambulatory Visit: Payer: Self-pay | Admitting: *Deleted

## 2019-08-31 MED ORDER — LOSARTAN POTASSIUM 100 MG PO TABS: 100.0000 mg | ORAL_TABLET | Freq: Every day | ORAL | 3 refills | Status: DC

## 2019-10-11 DIAGNOSIS — I10 Essential (primary) hypertension: Secondary | ICD-10-CM | POA: Diagnosis not present

## 2019-10-11 DIAGNOSIS — N529 Male erectile dysfunction, unspecified: Secondary | ICD-10-CM | POA: Diagnosis not present

## 2019-10-11 DIAGNOSIS — K219 Gastro-esophageal reflux disease without esophagitis: Secondary | ICD-10-CM | POA: Diagnosis not present

## 2019-10-11 DIAGNOSIS — Z23 Encounter for immunization: Secondary | ICD-10-CM | POA: Diagnosis not present

## 2019-10-11 DIAGNOSIS — G479 Sleep disorder, unspecified: Secondary | ICD-10-CM | POA: Diagnosis not present

## 2019-11-15 DIAGNOSIS — Z1389 Encounter for screening for other disorder: Secondary | ICD-10-CM | POA: Diagnosis not present

## 2019-11-15 DIAGNOSIS — Z0000 Encounter for general adult medical examination without abnormal findings: Secondary | ICD-10-CM | POA: Diagnosis not present

## 2019-12-05 ENCOUNTER — Other Ambulatory Visit: Payer: Self-pay | Admitting: Cardiology

## 2019-12-17 ENCOUNTER — Other Ambulatory Visit: Payer: Self-pay | Admitting: *Deleted

## 2019-12-17 DIAGNOSIS — E7800 Pure hypercholesterolemia, unspecified: Secondary | ICD-10-CM

## 2019-12-17 DIAGNOSIS — I2510 Atherosclerotic heart disease of native coronary artery without angina pectoris: Secondary | ICD-10-CM

## 2019-12-21 ENCOUNTER — Other Ambulatory Visit: Payer: Self-pay | Admitting: Cardiology

## 2019-12-21 DIAGNOSIS — I10 Essential (primary) hypertension: Secondary | ICD-10-CM | POA: Diagnosis not present

## 2019-12-21 DIAGNOSIS — I2510 Atherosclerotic heart disease of native coronary artery without angina pectoris: Secondary | ICD-10-CM | POA: Diagnosis not present

## 2019-12-21 DIAGNOSIS — K219 Gastro-esophageal reflux disease without esophagitis: Secondary | ICD-10-CM | POA: Diagnosis not present

## 2019-12-21 DIAGNOSIS — Z8546 Personal history of malignant neoplasm of prostate: Secondary | ICD-10-CM | POA: Diagnosis not present

## 2019-12-21 DIAGNOSIS — E7800 Pure hypercholesterolemia, unspecified: Secondary | ICD-10-CM | POA: Diagnosis not present

## 2019-12-24 ENCOUNTER — Other Ambulatory Visit: Payer: PPO

## 2020-01-24 ENCOUNTER — Other Ambulatory Visit: Payer: PPO

## 2020-01-25 DIAGNOSIS — Z20822 Contact with and (suspected) exposure to covid-19: Secondary | ICD-10-CM | POA: Diagnosis not present
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
## 2020-01-27 ENCOUNTER — Other Ambulatory Visit: Payer: PPO | Admitting: *Deleted

## 2020-01-27 ENCOUNTER — Other Ambulatory Visit: Payer: Self-pay

## 2020-01-27 ENCOUNTER — Other Ambulatory Visit: Payer: Self-pay | Admitting: *Deleted

## 2020-01-27 DIAGNOSIS — E7800 Pure hypercholesterolemia, unspecified: Secondary | ICD-10-CM | POA: Diagnosis not present

## 2020-01-27 DIAGNOSIS — I2510 Atherosclerotic heart disease of native coronary artery without angina pectoris: Secondary | ICD-10-CM | POA: Diagnosis not present

## 2020-01-27 DIAGNOSIS — Z20822 Contact with and (suspected) exposure to covid-19: Secondary | ICD-10-CM | POA: Diagnosis not present

## 2020-01-27 LAB — LIPID PANEL
Chol/HDL Ratio: 2.1 ratio (ref 0.0–5.0)
Cholesterol, Total: 139 mg/dL (ref 100–199)
HDL: 66 mg/dL (ref >39)
LDL Chol Calc (NIH): 59 mg/dL (ref 0–99)
Triglycerides: 72 mg/dL (ref 0–149)
VLDL Cholesterol Cal: 14 mg/dL (ref 5–40)

## 2020-01-27 LAB — COMPREHENSIVE METABOLIC PANEL
ALT: 23 IU/L (ref 0–44)
AST: 19 IU/L (ref 0–40)
Albumin/Globulin Ratio: 1.8 (ref 1.2–2.2)
Albumin: 4.4 g/dL (ref 3.8–4.8)
Alkaline Phosphatase: 73 IU/L (ref 44–121)
BUN/Creatinine Ratio: 14 (ref 10–24)
BUN: 14 mg/dL (ref 8–27)
Bilirubin Total: 0.4 mg/dL (ref 0.0–1.2)
CO2: 22 mmol/L (ref 20–29)
Calcium: 9.4 mg/dL (ref 8.6–10.2)
Chloride: 104 mmol/L (ref 96–106)
Creatinine, Ser: 1.02 mg/dL (ref 0.76–1.27)
GFR calc Af Amer: 86 mL/min/{1.73_m2} (ref >59)
GFR calc non Af Amer: 75 mL/min/{1.73_m2} (ref >59)
Globulin, Total: 2.5 g/dL (ref 1.5–4.5)
Glucose: 115 mg/dL — ABNORMAL HIGH (ref 65–99)
Potassium: 5 mmol/L (ref 3.5–5.2)
Sodium: 141 mmol/L (ref 134–144)
Total Protein: 6.9 g/dL (ref 6.0–8.5)

## 2020-01-31 ENCOUNTER — Encounter: Payer: Self-pay | Admitting: Cardiology

## 2020-01-31 ENCOUNTER — Ambulatory Visit: Payer: PPO | Admitting: Cardiology

## 2020-01-31 ENCOUNTER — Encounter: Payer: Self-pay | Admitting: *Deleted

## 2020-01-31 ENCOUNTER — Other Ambulatory Visit: Payer: Self-pay

## 2020-01-31 VITALS — BP 162/80 | HR 51 | Ht 69.5 in | Wt 254.5 lb

## 2020-01-31 DIAGNOSIS — I10 Essential (primary) hypertension: Secondary | ICD-10-CM

## 2020-01-31 DIAGNOSIS — E7800 Pure hypercholesterolemia, unspecified: Secondary | ICD-10-CM | POA: Diagnosis not present

## 2020-01-31 DIAGNOSIS — I2510 Atherosclerotic heart disease of native coronary artery without angina pectoris: Secondary | ICD-10-CM

## 2020-01-31 MED ORDER — HYDROCHLOROTHIAZIDE 12.5 MG PO CAPS: 12.5000 mg | ORAL_CAPSULE | Freq: Every day | ORAL | 3 refills | Status: DC

## 2020-01-31 NOTE — Addendum Note (Signed)
Addended by: Cristopher Estimable on: 01/31/2020 03:55 PM   Modules accepted: Orders

## 2020-01-31 NOTE — Patient Instructions (Signed)
Medication Instructions:   START HCTZ 12.5 MG ONCE DAILY  *If you need a refill on your cardiac medications before your next appointment, please call your pharmacy*   Lab Work:  Your physician recommends that you return for lab work in: [place]  If you have labs (blood work) drawn today and your tests are completely normal, you will receive your results only by: [name] MyChart Message (if you have MyChart) OR . A paper copy in the mail If you have any lab test that is abnormal or we need to change your treatment, we will call you to review the results.   Follow-Up: At [facility], you and your health needs are our priority.  As part of our continuing mission to provide you with exceptional heart care, we have created designated Provider Care Teams.  These Care Teams include your primary Cardiologist (physician) and Advanced Practice Providers (APPs -  Physician Assistants and Nurse Practitioners) who all work together to provide you with the care you need, when you need it.  We recommend signing up for the patient portal called "MyChart".  Sign up information is provided on this After Visit Summary.  MyChart is used to connect with patients for Virtual Visits (Telemedicine).  Patients are able to view lab/test results, encounter notes, upcoming appointments, etc.  Non-urgent messages can be sent to your provider as well.   To learn more about what you can do with MyChart, go to [email or web address].    Your next appointment:   12 month(s)  The format for your next appointment:   In Person  Provider:   Kirk Ruths, MD

## 2020-02-01 ENCOUNTER — Telehealth: Payer: Self-pay | Admitting: Cardiology

## 2020-02-01 NOTE — Telephone Encounter (Signed)
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 Stated he wants to monitor B/P for 1 week before he starts taking.He will monitor B/P for 1 week and call back next week to report B/P readings.

## 2020-02-01 NOTE — Telephone Encounter (Signed)
Patient is calling requesting to speak with Hilda Blades with follow up questions regarding his appt yesterday 01/31/2020. Please advise.

## 2020-02-07 ENCOUNTER — Other Ambulatory Visit: Payer: PPO

## 2020-02-28 DIAGNOSIS — H00025 Hordeolum internum left lower eyelid: Secondary | ICD-10-CM | POA: Diagnosis not present

## 2020-02-28 DIAGNOSIS — R7301 Impaired fasting glucose: Secondary | ICD-10-CM | POA: Diagnosis not present

## 2020-02-28 DIAGNOSIS — N529 Male erectile dysfunction, unspecified: Secondary | ICD-10-CM | POA: Diagnosis not present

## 2020-02-28 DIAGNOSIS — E875 Hyperkalemia: Secondary | ICD-10-CM | POA: Diagnosis not present

## 2020-02-28 DIAGNOSIS — I10 Essential (primary) hypertension: Secondary | ICD-10-CM | POA: Diagnosis not present

## 2020-04-06 DIAGNOSIS — K219 Gastro-esophageal reflux disease without esophagitis: Secondary | ICD-10-CM | POA: Diagnosis not present

## 2020-04-06 DIAGNOSIS — I2510 Atherosclerotic heart disease of native coronary artery without angina pectoris: Secondary | ICD-10-CM | POA: Diagnosis not present

## 2020-04-06 DIAGNOSIS — E7800 Pure hypercholesterolemia, unspecified: Secondary | ICD-10-CM | POA: Diagnosis not present

## 2020-04-06 DIAGNOSIS — I10 Essential (primary) hypertension: Secondary | ICD-10-CM | POA: Diagnosis not present

## 2020-04-06 DIAGNOSIS — Z8546 Personal history of malignant neoplasm of prostate: Secondary | ICD-10-CM | POA: Diagnosis not present

## 2020-04-10 DIAGNOSIS — M48061 Spinal stenosis, lumbar region without neurogenic claudication: Secondary | ICD-10-CM | POA: Diagnosis not present

## 2020-04-10 DIAGNOSIS — M47816 Spondylosis without myelopathy or radiculopathy, lumbar region: Secondary | ICD-10-CM | POA: Diagnosis not present

## 2020-04-20 ENCOUNTER — Telehealth: Payer: Self-pay | Admitting: Cardiology

## 2020-04-20 NOTE — Telephone Encounter (Signed)
New Message:     Pt said his doctor wants him to take  Meloxicam 15 mg  for his knee. He wants to know if this is alright for him to take, since he take a 81 mg of Aspirin each day?he says he take Meloxicam  1 time a day and 6 hrs after he take his other medicine.

## 2020-04-20 NOTE — Telephone Encounter (Signed)
We prefer that patients not take meloxicam any more than absolutely necessary.  It increases the risk of having MI or stroke.  Would prefer that he use topical diclofenac (Voltaren) gel or any other topical relief (capsaicin, Icy Hot, lidocaine, etc...).  If meloxicam is needed, would recommend that he use for 2-3 days, then switch to the topical options.   Can repeat this every 1-2 weeks.

## 2020-04-20 NOTE — Telephone Encounter (Signed)
Advised patient but he would like forwarded to Dr Stanford Breed for when he returns as well.

## 2020-04-20 NOTE — Telephone Encounter (Signed)
Spoke with patient and was given Mobic 15 mg once daily by orthopedic  Taking 6 hours after am medications  Has follow up in 2 weeks and seems to be helping, started earlier this week but wanted to make sure ok Will forward to Pharm D for review Patient only wants call back if not ok Advised to call back if MD wants to have him continue after 2 week follow up

## 2020-04-20 NOTE — Telephone Encounter (Signed)
Agree with pharmacists recommendations Kirk Ruths

## 2020-04-24 DIAGNOSIS — I10 Essential (primary) hypertension: Secondary | ICD-10-CM | POA: Diagnosis not present

## 2020-04-24 DIAGNOSIS — Z8546 Personal history of malignant neoplasm of prostate: Secondary | ICD-10-CM | POA: Diagnosis not present

## 2020-04-24 DIAGNOSIS — I2510 Atherosclerotic heart disease of native coronary artery without angina pectoris: Secondary | ICD-10-CM | POA: Diagnosis not present

## 2020-04-24 DIAGNOSIS — E7800 Pure hypercholesterolemia, unspecified: Secondary | ICD-10-CM | POA: Diagnosis not present

## 2020-04-24 DIAGNOSIS — K219 Gastro-esophageal reflux disease without esophagitis: Secondary | ICD-10-CM | POA: Diagnosis not present

## 2020-04-25 NOTE — Telephone Encounter (Signed)
Spoke with pt, Aware of dr crenshaw's recommendations.  °

## 2020-04-25 NOTE — Telephone Encounter (Signed)
   Pt is calling back, he said he forgot to ask Hilda Blades a question

## 2020-04-25 NOTE — Telephone Encounter (Signed)
Spoke with pt, questions regarding Voltaren gel and tylenol use answered.

## 2020-05-04 DIAGNOSIS — S83241D Other tear of medial meniscus, current injury, right knee, subsequent encounter: Secondary | ICD-10-CM | POA: Diagnosis not present

## 2020-05-05 ENCOUNTER — Telehealth: Payer: Self-pay

## 2020-05-05 ENCOUNTER — Encounter: Payer: Self-pay | Admitting: Cardiology

## 2020-05-05 ENCOUNTER — Telehealth: Payer: Self-pay | Admitting: *Deleted

## 2020-05-05 NOTE — Telephone Encounter (Signed)
I s/w the pt that he would need a pre op appt with Dr. Angelena Form. Pt said he did not know who that was and that his cardiologist is Dr. Stanford Breed. I confirmed DOB and address with the pt. While researching further I found another MRN # that the pt is under as well with all the appt's showing with Dr. Stanford Breed, etc. Correct MRN for the pt is [id number]. Per Reuben Likes in HIM Dept stated I needed to put a ticket into IT and let them know there are 2 charts for this pt and that they will have to be combined. Reuben Likes, asked for me to leave her the 2 charts MRN's as well to make sure they are combined. Again the CORRECT MRN IS : [id number]. I have re-done the pre op clearance under the correct MRN and resent to pre op.

## 2020-05-05 NOTE — Telephone Encounter (Signed)
   [facility] Pre-operative Risk Assessment    Patient Name: Jeffery [name]  DOB: [birth year]  MRN:   Request for surgical clearance:  1. What type of surgery is being performed Right knee arthoscopy  2. When is this surgery scheduled 05/16/2020  3. What type of clearance is required (medical clearance vs. Pharmacy clearance to hold med vs. Both) Medical  4. Are there any medications that need to be held prior to surgery and how long   5. Practice name and name of physician performing surgery [facility]   Dr.Frank Aluisio  6. What is the office phone number [phone]   7.   What is the office fax number [phone]  8.   Anesthesia type Choice   Jeffery [name] 05/05/2020, 2:55 PM  _________________________________________________________________   (provider comments below)

## 2020-05-05 NOTE — Telephone Encounter (Signed)
Primary Cardiologist:Christopher Angelena Form, MD  Chart reviewed as part of pre-operative protocol coverage. Because of Jeffery [name]'s past medical history and time since last visit, he/she will require a follow-up visit in order to better assess preoperative cardiovascular risk.  Pre-op covering staff: - Please schedule appointment and call patient to inform them. - Please contact requesting surgeon's office via preferred method (i.e, phone, fax) to inform them of need for appointment prior to surgery.  If applicable, this message will also be routed to pharmacy pool and/or primary cardiologist for input on holding anticoagulant/antiplatelet agent as requested below so that this information is available at time of patient's appointment.   Deberah Pelton, NP  05/05/2020, 3:19 PM

## 2020-05-05 NOTE — Telephone Encounter (Signed)
[facility] Pre-operative Risk Assessment   Patient Name: Jeffery [name] Hip DOB: 03-18-2050 MRN: [id number]   Request for surgical clearance:  1. What type of surgery is being performed Right knee arthoscopy  2. When is this surgery scheduled 05/16/2020  3. What type of clearance is required (medical clearance vs. Pharmacy clearance to hold med vs. Both) Medical  4. Are there any medications that need to be held prior to surgery and how long ASA  5. Practice name and name of physician performing surgery [facility]   Dr.Frank Aluisio  6. What is the office phone number [phone]   7.   What is the office fax number [phone]  8.   Anesthesia type: Choice   Kathyrn Lass 05/05/2020, 2:55 PM  _________________________________________________________________   (provider comments below)

## 2020-05-05 NOTE — Telephone Encounter (Signed)
Jeffery [name] 70 year old male was last seen in the clinic on 01/31/2020.  During that time he denied chest pain, dyspnea, syncope, and palpitations.  He is requesting preoperative cardiac evaluation for right knee arthroscopy.  His PMH includes coronary artery disease, HLD, HTN.  He underwent cardiac catheterization 6/09 and received DES to his LAD and RCA.  His ejection fraction at that time was 40%.  His ejection fraction further improved and on 10/09 his LV function was normal.  He underwent cardiac stress testing 2/17 and is ejection fraction was 50-55%.  He again underwent cardiac catheterization 2/17 which showed patent stents and nonobstructive CAD.  A nuclear stress test 1/21 showed an ejection fraction of 47%, prior infarct but no ischemia.  May his aspirin be held prior to the procedure?  Thank you for your help.  Please direct response to CV [facility] preop will.  Jossie Ng. Lizbett Garciagarcia NP-C    05/05/2020, 4:00 PM [address] Office [phone] Fax [phone]

## 2020-05-08 ENCOUNTER — Other Ambulatory Visit: Payer: Self-pay | Admitting: Physician Assistant

## 2020-05-08 NOTE — Telephone Encounter (Signed)
Patient returned call regarding his clearance. I contacted pre-op. Covering PA states he is on the phone with another patient, but will return call shortly.

## 2020-05-08 NOTE — Telephone Encounter (Addendum)
Left message for the patient to call back and speak to the on-call preop APP of the day.  Patient was just seen in January 2022, last Myoview in January 2021 was low risk.  As long as he is not having any anginal symptoms, I think he can be cleared.  Note, per Dr. Stanford Breed, "Okay to hold aspirin prior to procedure and resume after."

## 2020-05-08 NOTE — Telephone Encounter (Signed)
   Name: Maki Sweetser Grassel  DOB: Oct 27, 1950  MRN: [id number]   Primary Cardiologist: Lauree Chandler, MD  Chart reviewed as part of pre-operative protocol coverage. Patient was contacted 05/08/2020 in reference to pre-operative risk assessment for pending surgery as outlined below.  Estus L Monda was last seen on 01/31/2020 by Dr. Stanford Breed.  Since that day, Ayren Zumbro Cuff has done well without any exertional chest pain or worsening dyspnea.  Therefore, based on ACC/AHA guidelines, the patient would be at acceptable risk for the planned procedure without further cardiovascular testing.   Patient may hold aspirin and fish oil for 7 days prior to the surgery and restart as soon as possible afterward at the surgeon's discretion.  The patient was advised that if he develops new symptoms prior to surgery to contact our office to arrange for a follow-up visit, and he verbalized understanding.  I will route this recommendation to the requesting party via Epic fax function and remove from pre-op pool. Please call with questions.  [place], [place] 05/08/2020, 3:30 PM

## 2020-05-16 DIAGNOSIS — X58XXXA Exposure to other specified factors, initial encounter: Secondary | ICD-10-CM | POA: Diagnosis not present

## 2020-05-16 DIAGNOSIS — S83281A Other tear of lateral meniscus, current injury, right knee, initial encounter: Secondary | ICD-10-CM | POA: Diagnosis not present

## 2020-05-16 DIAGNOSIS — S83231A Complex tear of medial meniscus, current injury, right knee, initial encounter: Secondary | ICD-10-CM | POA: Diagnosis not present

## 2020-05-16 DIAGNOSIS — S83271A Complex tear of lateral meniscus, current injury, right knee, initial encounter: Secondary | ICD-10-CM | POA: Diagnosis not present

## 2020-05-16 DIAGNOSIS — Y999 Unspecified external cause status: Secondary | ICD-10-CM | POA: Diagnosis not present

## 2020-05-16 DIAGNOSIS — M958 Other specified acquired deformities of musculoskeletal system: Secondary | ICD-10-CM | POA: Diagnosis not present

## 2020-05-16 DIAGNOSIS — G8918 Other acute postprocedural pain: Secondary | ICD-10-CM | POA: Diagnosis not present

## 2020-05-16 DIAGNOSIS — M2241 Chondromalacia patellae, right knee: Secondary | ICD-10-CM | POA: Diagnosis not present

## 2020-05-21 IMAGING — US US EXTREM LOW VENOUS*L*
1 series · 13 of 24 positions shown · non-contrast
Comparison: None.

CLINICAL DATA: Patient with left leg pain and swelling.



[Series 1: us extrem low venous*left* · 13 of 33 slices shown]
[im 1/33]
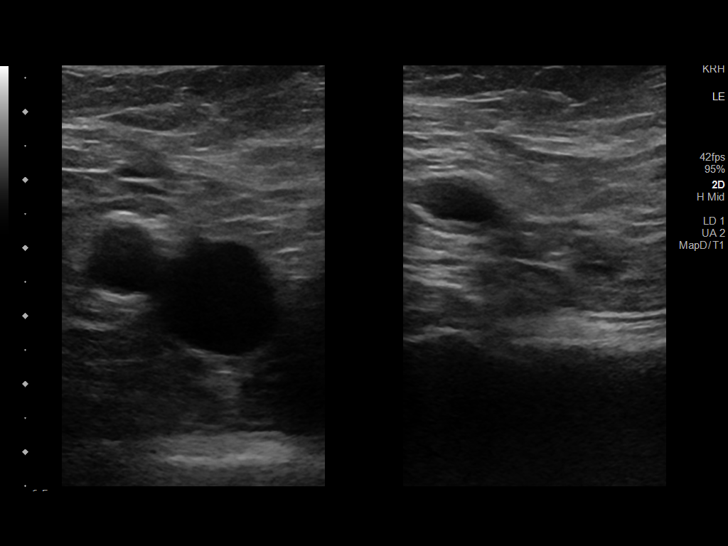
[im 3/33]
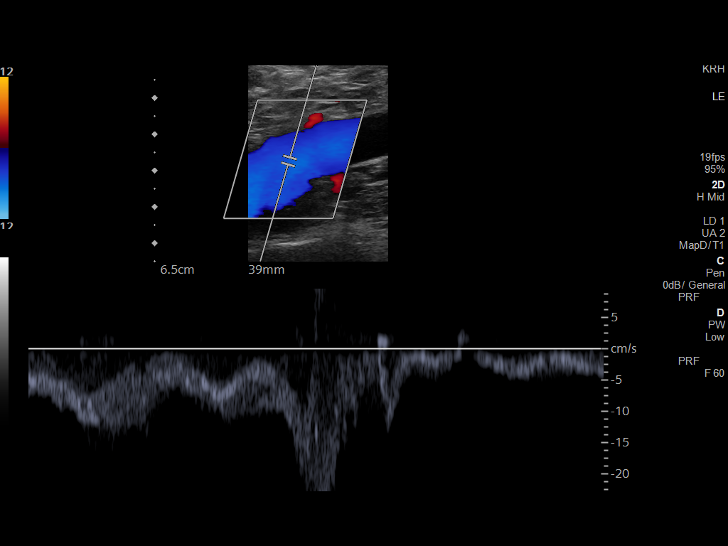
[im 6/33]
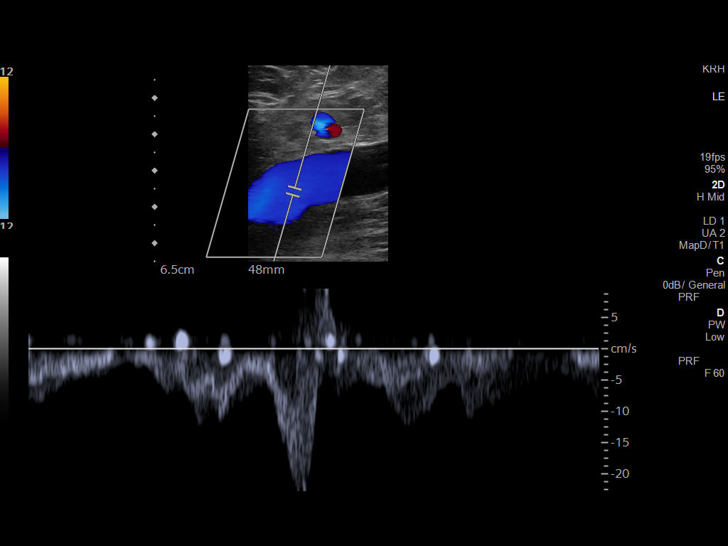
[im 9/33]
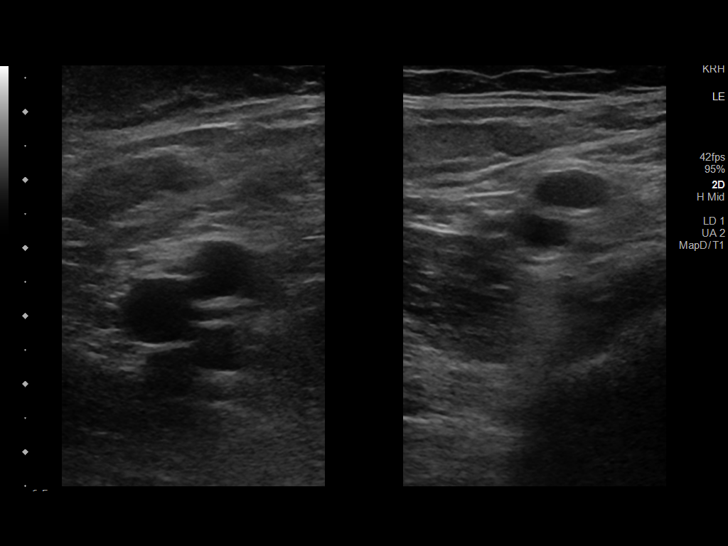
[im 12/33]
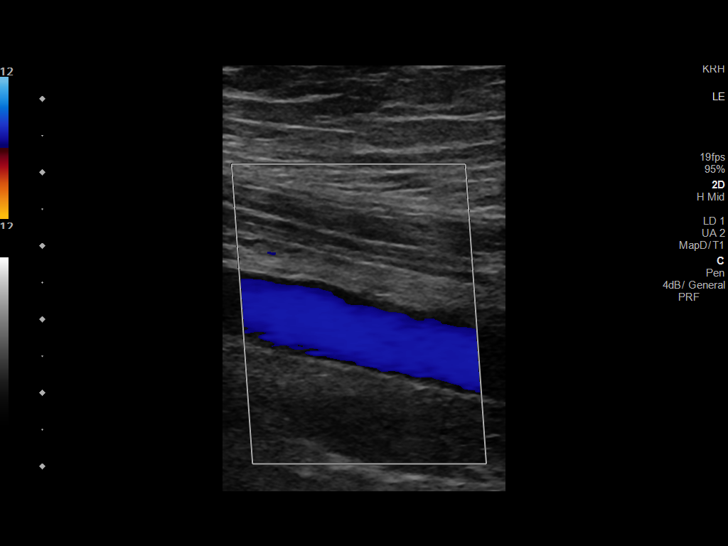
[im 14/33]
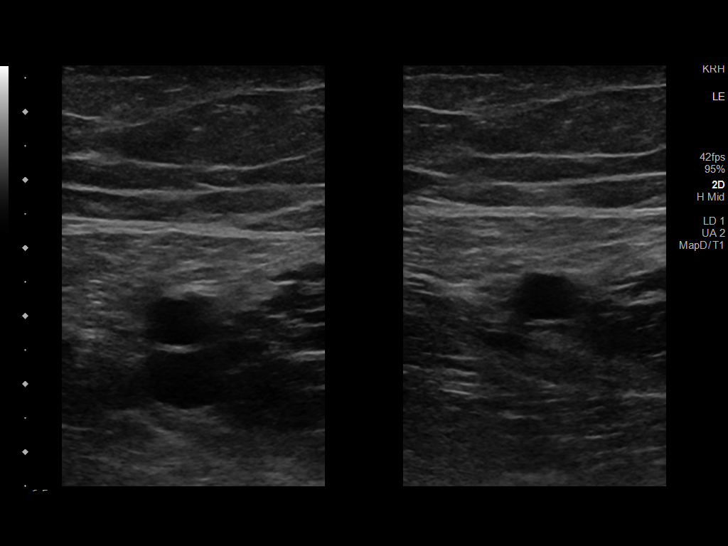
[im 17/33]
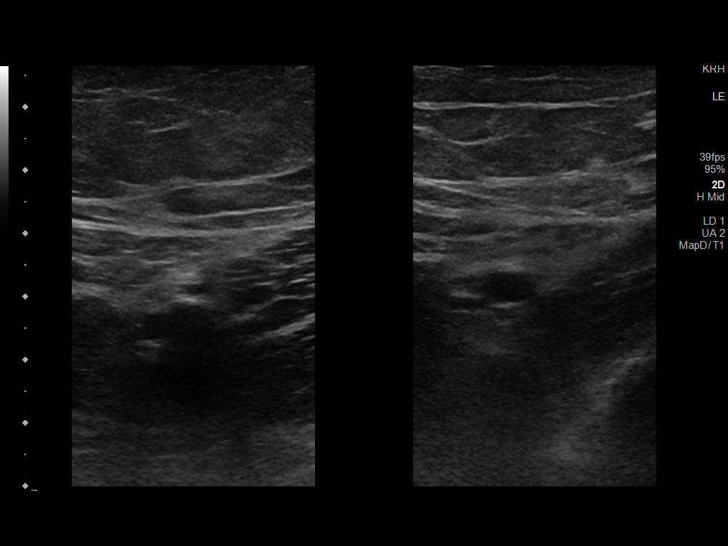
[im 19/33]
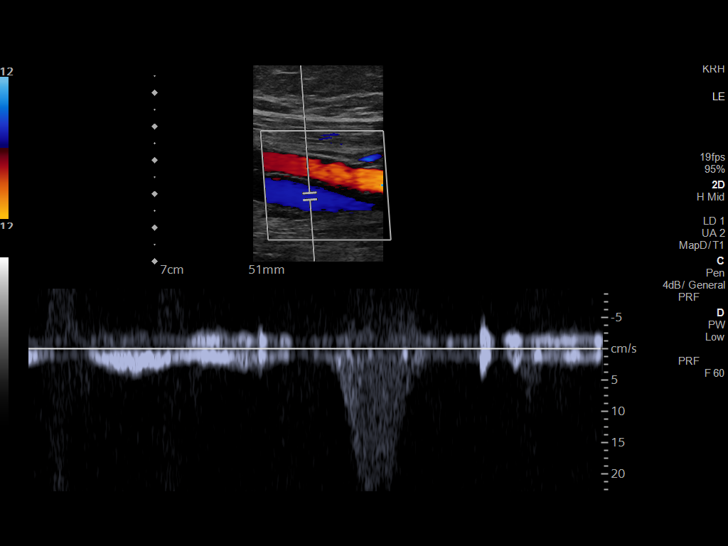
[im 21/33]
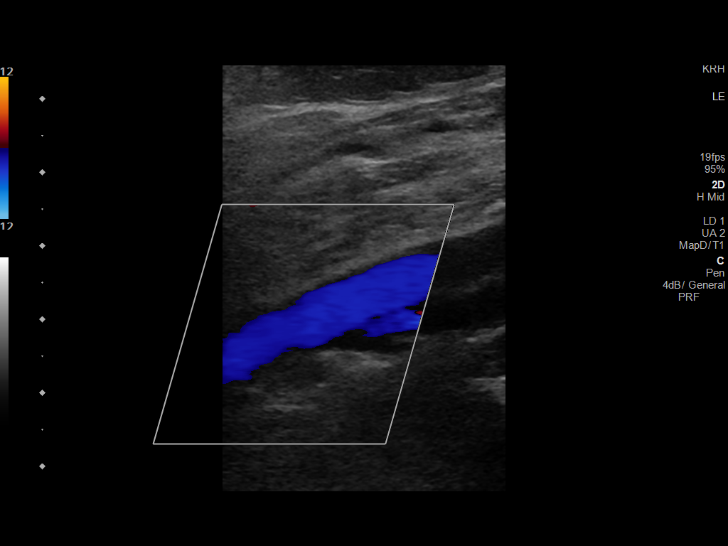
[im 24/33]
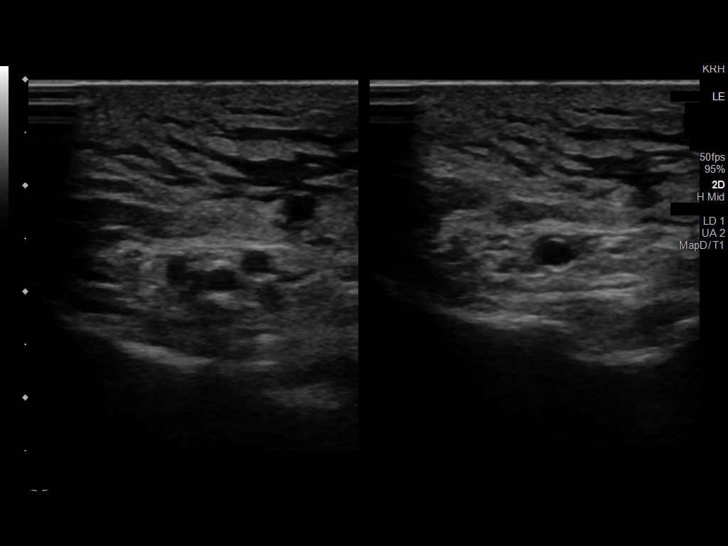
[im 27/33]
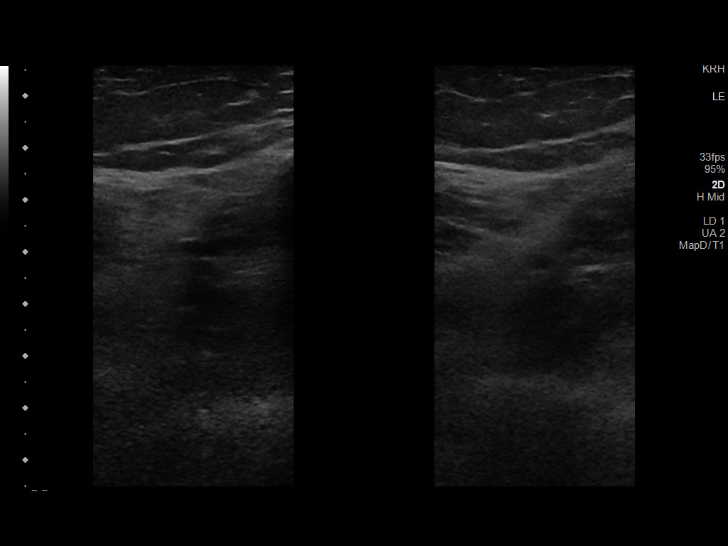
[im 30/33]
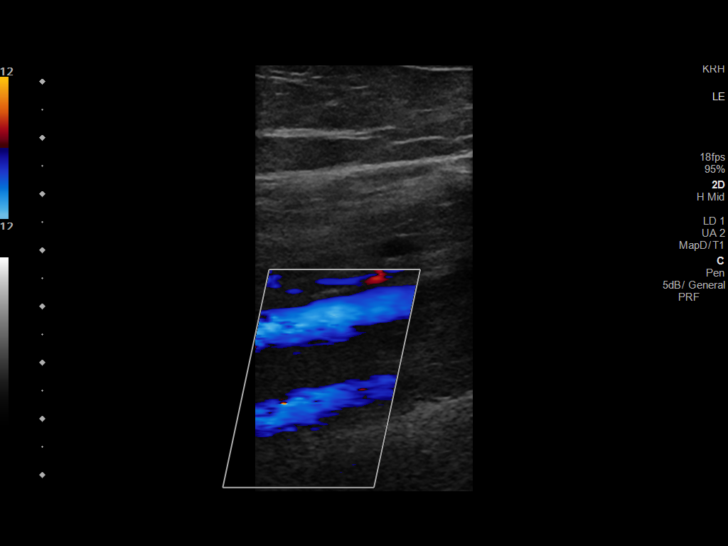
[im 33/33]
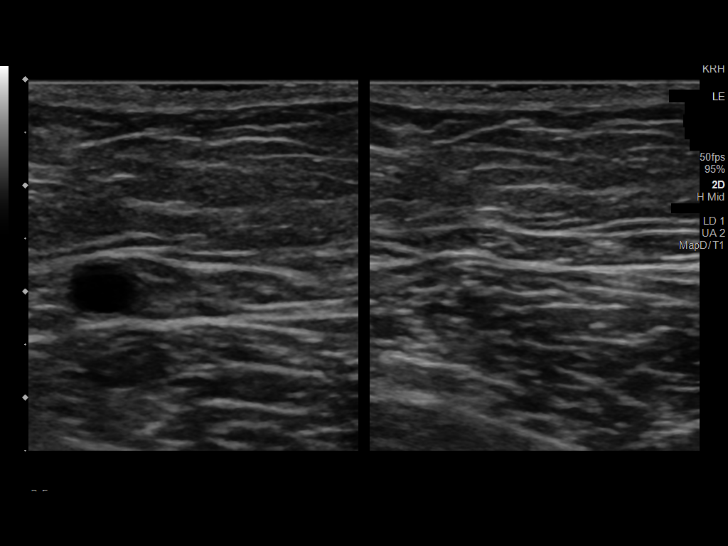

[13 of 24 positions shown; findings below may reference images not displayed]

FINDINGS: Contralateral Common Femoral Vein: Respiratory phasicity is normal
and symmetric with the symptomatic side. No evidence of thrombus.
Normal compressibility.

Common Femoral Vein: No evidence of thrombus. Normal
compressibility, respiratory phasicity and response to augmentation.

Saphenofemoral Junction: No evidence of thrombus. Normal
compressibility and flow on color Doppler imaging.

Profunda Femoral Vein: No evidence of thrombus. Normal
compressibility and flow on color Doppler imaging.

Femoral Vein: No evidence of thrombus. Normal compressibility,
respiratory phasicity and response to augmentation.

Popliteal Vein: No evidence of thrombus. Normal compressibility,
respiratory phasicity and response to augmentation.

Calf Veins: Poorly visualized due to edema.

Superficial Great Saphenous Vein: No evidence of thrombus. Normal
compressibility.

Venous Reflux:  None.

Other Findings:  None.
IMPRESSION: No evidence of deep venous thrombosis.

## 2020-05-23 DIAGNOSIS — S83241D Other tear of medial meniscus, current injury, right knee, subsequent encounter: Secondary | ICD-10-CM | POA: Diagnosis not present

## 2020-06-01 DIAGNOSIS — K219 Gastro-esophageal reflux disease without esophagitis: Secondary | ICD-10-CM | POA: Diagnosis not present

## 2020-06-02 DIAGNOSIS — K219 Gastro-esophageal reflux disease without esophagitis: Secondary | ICD-10-CM | POA: Diagnosis not present

## 2020-06-02 DIAGNOSIS — I2510 Atherosclerotic heart disease of native coronary artery without angina pectoris: Secondary | ICD-10-CM | POA: Diagnosis not present

## 2020-06-02 DIAGNOSIS — E7800 Pure hypercholesterolemia, unspecified: Secondary | ICD-10-CM | POA: Diagnosis not present

## 2020-06-02 DIAGNOSIS — I10 Essential (primary) hypertension: Secondary | ICD-10-CM | POA: Diagnosis not present

## 2020-06-12 DIAGNOSIS — Z4789 Encounter for other orthopedic aftercare: Secondary | ICD-10-CM | POA: Diagnosis not present

## 2020-06-19 ENCOUNTER — Other Ambulatory Visit: Payer: Self-pay | Admitting: Cardiology

## 2020-07-03 DIAGNOSIS — I2510 Atherosclerotic heart disease of native coronary artery without angina pectoris: Secondary | ICD-10-CM | POA: Diagnosis not present

## 2020-07-03 DIAGNOSIS — K219 Gastro-esophageal reflux disease without esophagitis: Secondary | ICD-10-CM | POA: Diagnosis not present

## 2020-07-03 DIAGNOSIS — I10 Essential (primary) hypertension: Secondary | ICD-10-CM | POA: Diagnosis not present

## 2020-07-03 DIAGNOSIS — E7800 Pure hypercholesterolemia, unspecified: Secondary | ICD-10-CM | POA: Diagnosis not present

## 2020-07-13 ENCOUNTER — Other Ambulatory Visit: Payer: Self-pay | Admitting: Cardiology

## 2020-07-20 DIAGNOSIS — M47816 Spondylosis without myelopathy or radiculopathy, lumbar region: Secondary | ICD-10-CM | POA: Diagnosis not present

## 2020-07-28 DIAGNOSIS — M47816 Spondylosis without myelopathy or radiculopathy, lumbar region: Secondary | ICD-10-CM | POA: Diagnosis not present

## 2020-08-01 DIAGNOSIS — Z8546 Personal history of malignant neoplasm of prostate: Secondary | ICD-10-CM | POA: Diagnosis not present

## 2020-08-01 DIAGNOSIS — K219 Gastro-esophageal reflux disease without esophagitis: Secondary | ICD-10-CM | POA: Diagnosis not present

## 2020-08-01 DIAGNOSIS — I2510 Atherosclerotic heart disease of native coronary artery without angina pectoris: Secondary | ICD-10-CM | POA: Diagnosis not present

## 2020-08-01 DIAGNOSIS — E7800 Pure hypercholesterolemia, unspecified: Secondary | ICD-10-CM | POA: Diagnosis not present

## 2020-08-01 DIAGNOSIS — I10 Essential (primary) hypertension: Secondary | ICD-10-CM | POA: Diagnosis not present

## 2020-08-14 DIAGNOSIS — M48061 Spinal stenosis, lumbar region without neurogenic claudication: Secondary | ICD-10-CM | POA: Diagnosis not present

## 2020-08-14 DIAGNOSIS — M47816 Spondylosis without myelopathy or radiculopathy, lumbar region: Secondary | ICD-10-CM | POA: Diagnosis not present

## 2020-08-22 DIAGNOSIS — M25561 Pain in right knee: Secondary | ICD-10-CM | POA: Diagnosis not present

## 2020-09-01 DIAGNOSIS — M47816 Spondylosis without myelopathy or radiculopathy, lumbar region: Secondary | ICD-10-CM | POA: Diagnosis not present

## 2020-09-01 DIAGNOSIS — M47896 Other spondylosis, lumbar region: Secondary | ICD-10-CM | POA: Diagnosis not present

## 2020-09-05 DIAGNOSIS — I10 Essential (primary) hypertension: Secondary | ICD-10-CM | POA: Diagnosis not present

## 2020-09-19 DIAGNOSIS — M25561 Pain in right knee: Secondary | ICD-10-CM | POA: Diagnosis not present

## 2020-09-20 DIAGNOSIS — M47816 Spondylosis without myelopathy or radiculopathy, lumbar region: Secondary | ICD-10-CM | POA: Diagnosis not present

## 2020-10-03 DIAGNOSIS — I10 Essential (primary) hypertension: Secondary | ICD-10-CM | POA: Diagnosis not present

## 2020-10-03 DIAGNOSIS — K219 Gastro-esophageal reflux disease without esophagitis: Secondary | ICD-10-CM | POA: Diagnosis not present

## 2020-10-03 DIAGNOSIS — I2510 Atherosclerotic heart disease of native coronary artery without angina pectoris: Secondary | ICD-10-CM | POA: Diagnosis not present

## 2020-10-03 DIAGNOSIS — E7800 Pure hypercholesterolemia, unspecified: Secondary | ICD-10-CM | POA: Diagnosis not present

## 2020-11-06 DIAGNOSIS — D692 Other nonthrombocytopenic purpura: Secondary | ICD-10-CM | POA: Diagnosis not present

## 2020-11-06 DIAGNOSIS — Z6838 Body mass index (BMI) 38.0-38.9, adult: Secondary | ICD-10-CM | POA: Diagnosis not present

## 2020-11-29 ENCOUNTER — Telehealth: Payer: Self-pay | Admitting: Cardiology

## 2020-11-29 DIAGNOSIS — I2510 Atherosclerotic heart disease of native coronary artery without angina pectoris: Secondary | ICD-10-CM

## 2020-11-29 NOTE — Telephone Encounter (Signed)
*  STAT* If patient is at the pharmacy, call can be transferred to refill team.   1. Which medications need to be refilled? (please list name of each medication and dose if known)  nitroGLYCERIN (NITROSTAT) 0.4 MG SL tablet  2. Which pharmacy/location (including street and city if local pharmacy) is medication to be sent to? [id number] - [place], [place] - [address] AT [place]  3. Do they need a 30 day or 90 day supply? 90 day supply

## 2020-12-04 DIAGNOSIS — M25561 Pain in right knee: Secondary | ICD-10-CM | POA: Diagnosis not present

## 2020-12-04 MED ORDER — NITROGLYCERIN 0.4 MG SL SUBL: SUBLINGUAL_TABLET | SUBLINGUAL | 6 refills | Status: DC

## 2020-12-16 ENCOUNTER — Other Ambulatory Visit: Payer: Self-pay | Admitting: Cardiology

## 2020-12-27 DIAGNOSIS — U071 COVID-19: Secondary | ICD-10-CM | POA: Diagnosis not present

## 2020-12-27 DIAGNOSIS — R059 Cough, unspecified: Secondary | ICD-10-CM | POA: Diagnosis not present

## 2020-12-27 DIAGNOSIS — J029 Acute pharyngitis, unspecified: Secondary | ICD-10-CM | POA: Diagnosis not present

## 2020-12-27 DIAGNOSIS — R051 Acute cough: Secondary | ICD-10-CM | POA: Diagnosis not present

## 2020-12-27 DIAGNOSIS — N529 Male erectile dysfunction, unspecified: Secondary | ICD-10-CM | POA: Diagnosis not present

## 2020-12-27 DIAGNOSIS — M79661 Pain in right lower leg: Secondary | ICD-10-CM | POA: Diagnosis not present

## 2020-12-27 DIAGNOSIS — M79604 Pain in right leg: Secondary | ICD-10-CM | POA: Diagnosis not present

## 2020-12-31 ENCOUNTER — Other Ambulatory Visit: Payer: Self-pay | Admitting: Cardiology

## 2021-01-05 DIAGNOSIS — M1712 Unilateral primary osteoarthritis, left knee: Secondary | ICD-10-CM | POA: Diagnosis not present

## 2021-01-05 DIAGNOSIS — M2392 Unspecified internal derangement of left knee: Secondary | ICD-10-CM | POA: Diagnosis not present

## 2021-01-05 DIAGNOSIS — M25562 Pain in left knee: Secondary | ICD-10-CM | POA: Diagnosis not present

## 2021-01-11 ENCOUNTER — Other Ambulatory Visit: Payer: Self-pay | Admitting: Surgery

## 2021-01-11 DIAGNOSIS — K439 Ventral hernia without obstruction or gangrene: Secondary | ICD-10-CM

## 2021-01-11 DIAGNOSIS — M25562 Pain in left knee: Secondary | ICD-10-CM | POA: Diagnosis not present

## 2021-01-11 DIAGNOSIS — K458 Other specified abdominal hernia without obstruction or gangrene: Secondary | ICD-10-CM

## 2021-01-18 ENCOUNTER — Other Ambulatory Visit: Payer: Self-pay | Admitting: *Deleted

## 2021-01-18 DIAGNOSIS — I2510 Atherosclerotic heart disease of native coronary artery without angina pectoris: Secondary | ICD-10-CM

## 2021-01-18 NOTE — Progress Notes (Signed)
car
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
## 2021-01-26 DIAGNOSIS — M25572 Pain in left ankle and joints of left foot: Secondary | ICD-10-CM | POA: Diagnosis not present

## 2021-01-26 DIAGNOSIS — M25472 Effusion, left ankle: Secondary | ICD-10-CM | POA: Diagnosis not present

## 2021-01-26 DIAGNOSIS — S83242A Other tear of medial meniscus, current injury, left knee, initial encounter: Secondary | ICD-10-CM | POA: Diagnosis not present

## 2021-01-29 ENCOUNTER — Other Ambulatory Visit: Payer: Self-pay

## 2021-01-29 ENCOUNTER — Other Ambulatory Visit: Payer: PPO

## 2021-01-29 DIAGNOSIS — I10 Essential (primary) hypertension: Secondary | ICD-10-CM | POA: Diagnosis not present

## 2021-01-31 ENCOUNTER — Telehealth: Payer: Self-pay | Admitting: Cardiology

## 2021-01-31 ENCOUNTER — Encounter: Payer: Self-pay | Admitting: *Deleted

## 2021-01-31 LAB — BASIC METABOLIC PANEL
BUN/Creatinine Ratio: 19 (ref 10–24)
BUN: 17 mg/dL (ref 8–27)
CO2: 28 mmol/L (ref 20–29)
Calcium: 9.7 mg/dL (ref 8.6–10.2)
Chloride: 103 mmol/L (ref 96–106)
Creatinine, Ser: 0.9 mg/dL (ref 0.76–1.27)
Glucose: 127 mg/dL — ABNORMAL HIGH (ref 70–99)
Potassium: 4.9 mmol/L (ref 3.5–5.2)
Sodium: 141 mmol/L (ref 134–144)
eGFR: 92 mL/min/{1.73_m2} (ref >59)

## 2021-01-31 NOTE — Telephone Encounter (Signed)
Returned call to patient who states he was calling to see if he could talk to [place]. Patient inquiring about his recent BMET results as he has a CT of his abdomen coming up on Monday 1/30. Advised patient of his results as follows:  Lelon Perla, MD  01/31/2021 10:36 AM EST     No change in meds Kirk Ruths   Patient states that he is also having a knee surgery that his orthopedic should be sending [place] a clearance for. Patient also states that depending on the results of his Abdominal CT he's having done he may be looking at having another surgery regarding this to repair his hernia. Patient would like to know Dr. Jacalyn Lefevre thoughts on possibly having two surgeries back to back. Patient also states that he would like to do his stress test in order to get his DOT clearance to return to work at some point. Patient would like to make sure his clearance doesn't lapse so when he can return to work he is able. Advised patient that I saw the stress test has been ordered already. Patient is also scheduled to see Dr. Stanford Breed on 1/31 in office. Advised patient I would forward message to Dr. Stanford Breed for review and any advice. Patient verbalized understanding.

## 2021-01-31 NOTE — Telephone Encounter (Signed)
Patient would like to speak to [facility]. He states it's very very important. Wouldn't go into further detail.

## 2021-01-31 NOTE — Telephone Encounter (Signed)
Spoke with pt, Aware of dr crenshaw's recommendations.  °

## 2021-02-01 ENCOUNTER — Other Ambulatory Visit (HOSPITAL_COMMUNITY): Payer: Self-pay | Admitting: Family Medicine

## 2021-02-01 DIAGNOSIS — M7989 Other specified soft tissue disorders: Secondary | ICD-10-CM

## 2021-02-01 DIAGNOSIS — M79604 Pain in right leg: Secondary | ICD-10-CM

## 2021-02-01 DIAGNOSIS — R52 Pain, unspecified: Secondary | ICD-10-CM

## 2021-02-02 ENCOUNTER — Ambulatory Visit (HOSPITAL_COMMUNITY)
Admission: RE | Admit: 2021-02-02 | Discharge: 2021-02-02 | Disposition: A | Discharge status: Home / Self Care | Payer: PPO | Source: Ambulatory Visit | Attending: Family Medicine | Admitting: Family Medicine

## 2021-02-02 ENCOUNTER — Other Ambulatory Visit: Payer: Self-pay

## 2021-02-02 DIAGNOSIS — R52 Pain, unspecified: Secondary | ICD-10-CM | POA: Diagnosis not present

## 2021-02-05 ENCOUNTER — Inpatient Hospital Stay: Admission: RE | Admit: 2021-02-05 | Payer: PPO | Source: Ambulatory Visit

## 2021-02-05 ENCOUNTER — Telehealth (HOSPITAL_COMMUNITY): Payer: Self-pay

## 2021-02-05 NOTE — Telephone Encounter (Signed)
Spoke with the patient, detailed instructions given. He stated that he would be here for his test. Asked to call back with any questions. S.Rohini Jaroszewski EMTP 

## 2021-02-06 ENCOUNTER — Other Ambulatory Visit: Payer: Self-pay

## 2021-02-06 ENCOUNTER — Ambulatory Visit: Payer: PPO | Admitting: Cardiology

## 2021-02-06 ENCOUNTER — Encounter: Payer: Self-pay | Admitting: Cardiology

## 2021-02-06 VITALS — BP 140/74 | HR 64 | Ht 69.5 in | Wt 262.8 lb

## 2021-02-06 DIAGNOSIS — I2510 Atherosclerotic heart disease of native coronary artery without angina pectoris: Secondary | ICD-10-CM | POA: Diagnosis not present

## 2021-02-06 DIAGNOSIS — I10 Essential (primary) hypertension: Secondary | ICD-10-CM | POA: Diagnosis not present

## 2021-02-06 DIAGNOSIS — E7800 Pure hypercholesterolemia, unspecified: Secondary | ICD-10-CM

## 2021-02-06 NOTE — Patient Instructions (Signed)

## 2021-02-08 ENCOUNTER — Other Ambulatory Visit: Payer: PPO

## 2021-02-08 ENCOUNTER — Ambulatory Visit (HOSPITAL_COMMUNITY): Payer: PPO | Attending: Cardiology

## 2021-02-08 ENCOUNTER — Other Ambulatory Visit: Payer: Self-pay

## 2021-02-08 DIAGNOSIS — E7800 Pure hypercholesterolemia, unspecified: Secondary | ICD-10-CM | POA: Diagnosis not present

## 2021-02-08 DIAGNOSIS — I2510 Atherosclerotic heart disease of native coronary artery without angina pectoris: Secondary | ICD-10-CM | POA: Diagnosis not present

## 2021-02-08 LAB — LIPID PANEL
Chol/HDL Ratio: 2.3 ratio (ref 0.0–5.0)
Cholesterol, Total: 139 mg/dL (ref 100–199)
HDL: 60 mg/dL (ref >39)
LDL Chol Calc (NIH): 64 mg/dL (ref 0–99)
Triglycerides: 75 mg/dL (ref 0–149)
VLDL Cholesterol Cal: 15 mg/dL (ref 5–40)

## 2021-02-08 LAB — MYOCARDIAL PERFUSION IMAGING
LV dias vol: 128 mL (ref 62–150)
LV sys vol: 68 mL
Nuc Stress EF: 47 %
Peak HR: 88 {beats}/min
Rest HR: 61 {beats}/min
Rest Nuclear Isotope Dose: 11 mCi
SDS: 1
SRS: 2
SSS: 3
ST Depression (mm): 0 mm
Stress Nuclear Isotope Dose: 30.2 mCi
TID: 0.97

## 2021-02-08 LAB — HEPATIC FUNCTION PANEL
ALT: 18 IU/L (ref 0–44)
AST: 20 IU/L (ref 0–40)
Albumin: 4 g/dL (ref 3.8–4.8)
Alkaline Phosphatase: 70 IU/L (ref 44–121)
Bilirubin Total: 0.5 mg/dL (ref 0.0–1.2)
Bilirubin, Direct: 0.12 mg/dL (ref 0.00–0.40)
Total Protein: 6.4 g/dL (ref 6.0–8.5)

## 2021-02-08 MED ORDER — TECHNETIUM TC 99M TETROFOSMIN IV KIT
11.0000 | PACK | Freq: Once | INTRAVENOUS | Status: AC | PRN
  Administered 2021-02-08: 11 via INTRAVENOUS
  Filled 2021-02-08: qty 11

## 2021-02-08 MED ORDER — REGADENOSON 0.4 MG/5ML IV SOLN
0.4000 mg | Freq: Once | INTRAVENOUS | Status: AC
  Administered 2021-02-08: 0.4 mg via INTRAVENOUS

## 2021-02-08 MED ORDER — TECHNETIUM TC 99M TETROFOSMIN IV KIT
30.2000 | PACK | Freq: Once | INTRAVENOUS | Status: AC | PRN
  Administered 2021-02-08: 30.2 via INTRAVENOUS
  Filled 2021-02-08: qty 31

## 2021-02-09 ENCOUNTER — Encounter: Payer: Self-pay | Admitting: *Deleted

## 2021-02-16 ENCOUNTER — Telehealth: Payer: Self-pay | Admitting: Cardiology

## 2021-02-16 DIAGNOSIS — M25562 Pain in left knee: Secondary | ICD-10-CM | POA: Diagnosis not present

## 2021-02-16 DIAGNOSIS — S83242A Other tear of medial meniscus, current injury, left knee, initial encounter: Secondary | ICD-10-CM | POA: Diagnosis not present

## 2021-02-16 NOTE — Telephone Encounter (Signed)
Spoke with patient to inform him that Dr. Stanford Breed does not recommend lasix for the edema the patient described. He voiced understanding of this conversation.

## 2021-02-16 NOTE — Telephone Encounter (Signed)
Jeffery [name] is calling stating that he has a baker cyst that ruptured and has caused swelling in his left leg and foot. He reports it is hurting and is requesting frusemide be be prescribed to get the fluid to go down. He is scheduled to have surgery on this 02/21. His Ortho Doctor recommended calling in requesting this.  Please advise.

## 2021-02-16 NOTE — Telephone Encounter (Signed)
Spoke with pt who reports that a Baker's cyst rupture and caused swelling in his leg. His He is scheduled for meniscal repair and wanted to know if he could have a lasix order for the edema in his foot and leg. His orthopod recommended ice packs and leg elevation. I agreed and added that he could do ankle pumps and drink plenty of water.  Summary of VAS [place] LOWER EXTREMITY VENOUS (DVT)  BILATERAL:  - No evidence of deep vein thrombosis seen in the lower extremities,  bilaterally.  - No evidence of superficial venous thrombosis in the lower extremities,  bilaterally.  -  RIGHT:  - No cystic structure found in the popliteal fossa.     LEFT:  - A cystic structure is found in the popliteal fossa. Subcutaneous edema  noted in area of the ankle.

## 2021-02-23 NOTE — Telephone Encounter (Signed)
I called and began speaking with [name] at [facility] in regard to pre op info needed. [name] had stated the pt is having a left knee scope, which at that same time we lost connection. I have been trying to get back on the phone with [name] and am having a heard time getting through. I have also sent a message to Betha Loa, RN at our [place] office to see if she still had the clearance request as I do not see the clearance was entered in.
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
## 2021-02-23 NOTE — Telephone Encounter (Signed)
71 year old male with history of CAD with NSTEMI s/p PCI/DES to the LAD and RCA, ICM, HTN, and HLD who will be undergoing left knee arthroscopy with meniscus debridement secondary to Baker's cyst and we are asked to hold aspirin prior to the procedure, scheduled for 02/27/2021.   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   Dr. Stanford Breed, please provide your recommendations regarding the interruption of aspirin and route back to the preop pool.

## 2021-02-23 NOTE — Telephone Encounter (Signed)
[name] with [facility] is following up. She states a clearance request was faxed to the office on 02/21/21 and she we would like to know if we have any updates as the patient is scheduled to have his procedure on 02/27/21.

## 2021-02-26 NOTE — Telephone Encounter (Signed)
Patient was returning call. Please advise ?

## 2021-02-26 NOTE — Telephone Encounter (Signed)
° °  Name: Jeffery [name]  DOB: 1950/12/23  MRN: [id number]   Primary Cardiologist: Kirk Ruths, MD  Chart reviewed as part of pre-operative protocol coverage. Patient was contacted 02/26/2021 in reference to pre-operative risk assessment for pending surgery as outlined below.  Jeffery [name] was last seen on 02/06/21 by Dr. Stanford Breed.  Since that day, Jeffery [name] has done well. He had a nuclear stress test that was negative for ischemia for DOT purposes. He denies any changes in his cardiac health, no angina.  Per Dr. Stanford Breed, he may hold ASA prior to knee surgery and resume after.    Therefore, based on ACC/AHA guidelines, the patient would be at acceptable risk for the planned procedure without further cardiovascular testing.   The patient was advised that if he develops new symptoms prior to surgery to contact our office to arrange for a follow-up visit, and he verbalized understanding.  I will route this recommendation to the requesting party via Epic fax function and remove from pre-op pool. Please call with questions.  Tami Lin Azrael Huss, PA 02/26/2021, 11:47 AM

## 2021-02-27 DIAGNOSIS — M23322 Other meniscus derangements, posterior horn of medial meniscus, left knee: Secondary | ICD-10-CM | POA: Diagnosis not present

## 2021-02-27 DIAGNOSIS — M23222 Derangement of posterior horn of medial meniscus due to old tear or injury, left knee: Secondary | ICD-10-CM | POA: Diagnosis not present

## 2021-02-27 DIAGNOSIS — M958 Other specified acquired deformities of musculoskeletal system: Secondary | ICD-10-CM | POA: Diagnosis not present

## 2021-02-27 NOTE — Telephone Encounter (Signed)
Per notes below, Angie spoke with patient and updated her clearance yesterday 02/26/21 at 11:47am. She faxed chart. Will remove from preop box.

## 2021-03-15 ENCOUNTER — Other Ambulatory Visit: Payer: PPO

## 2021-03-23 DIAGNOSIS — M47816 Spondylosis without myelopathy or radiculopathy, lumbar region: Secondary | ICD-10-CM | POA: Diagnosis not present

## 2021-03-29 DIAGNOSIS — Z5189 Encounter for other specified aftercare: Secondary | ICD-10-CM | POA: Diagnosis not present

## 2021-04-04 ENCOUNTER — Other Ambulatory Visit: Payer: PPO

## 2021-04-13 DIAGNOSIS — M48061 Spinal stenosis, lumbar region without neurogenic claudication: Secondary | ICD-10-CM | POA: Diagnosis not present

## 2021-04-13 DIAGNOSIS — M5416 Radiculopathy, lumbar region: Secondary | ICD-10-CM | POA: Diagnosis not present

## 2021-04-16 ENCOUNTER — Other Ambulatory Visit: Payer: PPO

## 2021-04-20 DIAGNOSIS — M5451 Vertebrogenic low back pain: Secondary | ICD-10-CM | POA: Diagnosis not present

## 2021-05-01 DIAGNOSIS — M5451 Vertebrogenic low back pain: Secondary | ICD-10-CM | POA: Diagnosis not present

## 2021-05-08 ENCOUNTER — Ambulatory Visit
Admission: RE | Admit: 2021-05-08 | Discharge: 2021-05-08 | Disposition: A | Discharge status: Home / Self Care | Payer: Worker's Compensation | Source: Ambulatory Visit | Attending: Surgery | Admitting: Surgery

## 2021-05-08 DIAGNOSIS — M5451 Vertebrogenic low back pain: Secondary | ICD-10-CM | POA: Diagnosis not present

## 2021-05-08 DIAGNOSIS — K439 Ventral hernia without obstruction or gangrene: Secondary | ICD-10-CM

## 2021-05-08 DIAGNOSIS — K458 Other specified abdominal hernia without obstruction or gangrene: Secondary | ICD-10-CM

## 2021-05-08 MED ORDER — IOPAMIDOL (ISOVUE-300) INJECTION 61%
100.0000 mL | Freq: Once | INTRAVENOUS | Status: AC | PRN
  Administered 2021-05-08: 100 mL via INTRAVENOUS

## 2021-05-16 ENCOUNTER — Encounter (HOSPITAL_BASED_OUTPATIENT_CLINIC_OR_DEPARTMENT_OTHER): Payer: Self-pay | Admitting: Physical Therapy

## 2021-05-16 ENCOUNTER — Ambulatory Visit (HOSPITAL_BASED_OUTPATIENT_CLINIC_OR_DEPARTMENT_OTHER): Payer: PPO | Attending: Orthopedic Surgery | Admitting: Physical Therapy

## 2021-05-16 DIAGNOSIS — R262 Difficulty in walking, not elsewhere classified: Secondary | ICD-10-CM | POA: Insufficient documentation

## 2021-05-16 DIAGNOSIS — M25562 Pain in left knee: Secondary | ICD-10-CM | POA: Diagnosis not present

## 2021-05-16 DIAGNOSIS — M5459 Other low back pain: Secondary | ICD-10-CM | POA: Insufficient documentation
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
## 2021-05-18 ENCOUNTER — Telehealth: Payer: Self-pay | Admitting: Cardiology

## 2021-05-18 NOTE — Telephone Encounter (Signed)
Stress test results faxed ?

## 2021-05-18 NOTE — Telephone Encounter (Signed)
Pt states that he would like his last Stress Test results that were done back in January of this year to be sent to [facility]. 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 Please advise ?

## 2021-05-23 ENCOUNTER — Encounter (HOSPITAL_BASED_OUTPATIENT_CLINIC_OR_DEPARTMENT_OTHER): Payer: Self-pay | Admitting: Physical Therapy

## 2021-05-23 ENCOUNTER — Ambulatory Visit (HOSPITAL_BASED_OUTPATIENT_CLINIC_OR_DEPARTMENT_OTHER): Payer: PPO | Admitting: Physical Therapy

## 2021-05-23 DIAGNOSIS — M25562 Pain in left knee: Secondary | ICD-10-CM | POA: Diagnosis not present

## 2021-05-23 DIAGNOSIS — R262 Difficulty in walking, not elsewhere classified: Secondary | ICD-10-CM

## 2021-05-23 DIAGNOSIS — M5459 Other low back pain: Secondary | ICD-10-CM
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
## 2021-05-31 ENCOUNTER — Encounter (HOSPITAL_BASED_OUTPATIENT_CLINIC_OR_DEPARTMENT_OTHER): Payer: Self-pay | Admitting: Physical Therapy

## 2021-05-31 ENCOUNTER — Ambulatory Visit (HOSPITAL_BASED_OUTPATIENT_CLINIC_OR_DEPARTMENT_OTHER): Payer: PPO | Admitting: Physical Therapy

## 2021-05-31 DIAGNOSIS — M5459 Other low back pain: Secondary | ICD-10-CM

## 2021-05-31 DIAGNOSIS — M25562 Pain in left knee: Secondary | ICD-10-CM | POA: Diagnosis not present

## 2021-05-31 DIAGNOSIS — R262 Difficulty in walking, not elsewhere classified: Secondary | ICD-10-CM
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
## 2021-06-06 ENCOUNTER — Ambulatory Visit (HOSPITAL_BASED_OUTPATIENT_CLINIC_OR_DEPARTMENT_OTHER): Payer: PPO | Admitting: Physical Therapy

## 2021-06-06 ENCOUNTER — Encounter (HOSPITAL_BASED_OUTPATIENT_CLINIC_OR_DEPARTMENT_OTHER): Payer: Self-pay | Admitting: Physical Therapy

## 2021-06-06 DIAGNOSIS — R262 Difficulty in walking, not elsewhere classified: Secondary | ICD-10-CM

## 2021-06-06 DIAGNOSIS — M5459 Other low back pain: Secondary | ICD-10-CM

## 2021-06-06 DIAGNOSIS — M25562 Pain in left knee: Secondary | ICD-10-CM | POA: Diagnosis not present
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
## 2021-06-12 ENCOUNTER — Ambulatory Visit (HOSPITAL_BASED_OUTPATIENT_CLINIC_OR_DEPARTMENT_OTHER): Payer: PPO | Attending: Orthopedic Surgery | Admitting: Physical Therapy

## 2021-06-12 ENCOUNTER — Encounter (HOSPITAL_BASED_OUTPATIENT_CLINIC_OR_DEPARTMENT_OTHER): Payer: Self-pay | Admitting: Physical Therapy

## 2021-06-12 DIAGNOSIS — R262 Difficulty in walking, not elsewhere classified: Secondary | ICD-10-CM | POA: Diagnosis not present

## 2021-06-12 DIAGNOSIS — M5459 Other low back pain: Secondary | ICD-10-CM | POA: Insufficient documentation
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
## 2021-06-14 ENCOUNTER — Encounter (HOSPITAL_BASED_OUTPATIENT_CLINIC_OR_DEPARTMENT_OTHER): Payer: Self-pay | Admitting: Physical Therapy

## 2021-06-14 ENCOUNTER — Ambulatory Visit (HOSPITAL_BASED_OUTPATIENT_CLINIC_OR_DEPARTMENT_OTHER): Payer: PPO | Admitting: Physical Therapy

## 2021-06-14 DIAGNOSIS — M5459 Other low back pain: Secondary | ICD-10-CM

## 2021-06-14 DIAGNOSIS — R262 Difficulty in walking, not elsewhere classified: Secondary | ICD-10-CM
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
## 2021-06-19 ENCOUNTER — Ambulatory Visit (HOSPITAL_BASED_OUTPATIENT_CLINIC_OR_DEPARTMENT_OTHER): Payer: Self-pay | Admitting: Physical Therapy

## 2021-06-21 ENCOUNTER — Ambulatory Visit (HOSPITAL_BASED_OUTPATIENT_CLINIC_OR_DEPARTMENT_OTHER): Payer: PPO | Admitting: Physical Therapy

## 2021-06-21 ENCOUNTER — Encounter (HOSPITAL_BASED_OUTPATIENT_CLINIC_OR_DEPARTMENT_OTHER): Payer: Self-pay | Admitting: Physical Therapy

## 2021-06-21 DIAGNOSIS — M5459 Other low back pain: Secondary | ICD-10-CM

## 2021-06-21 DIAGNOSIS — R262 Difficulty in walking, not elsewhere classified: Secondary | ICD-10-CM
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
## 2021-06-22 ENCOUNTER — Other Ambulatory Visit: Payer: Self-pay | Admitting: Cardiology

## 2021-06-26 ENCOUNTER — Ambulatory Visit (HOSPITAL_BASED_OUTPATIENT_CLINIC_OR_DEPARTMENT_OTHER): Payer: PPO | Admitting: Physical Therapy

## 2021-06-26 DIAGNOSIS — R262 Difficulty in walking, not elsewhere classified: Secondary | ICD-10-CM

## 2021-06-26 DIAGNOSIS — M5459 Other low back pain: Secondary | ICD-10-CM | POA: Diagnosis not present
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
## 2021-06-27 ENCOUNTER — Other Ambulatory Visit: Payer: Self-pay | Admitting: Neurosurgery

## 2021-06-27 DIAGNOSIS — M4804 Spinal stenosis, thoracic region: Secondary | ICD-10-CM

## 2021-06-27 DIAGNOSIS — K219 Gastro-esophageal reflux disease without esophagitis: Secondary | ICD-10-CM | POA: Diagnosis not present

## 2021-06-27 DIAGNOSIS — M48062 Spinal stenosis, lumbar region with neurogenic claudication: Secondary | ICD-10-CM | POA: Diagnosis not present

## 2021-06-27 DIAGNOSIS — E7800 Pure hypercholesterolemia, unspecified: Secondary | ICD-10-CM | POA: Diagnosis not present

## 2021-06-27 DIAGNOSIS — I10 Essential (primary) hypertension: Secondary | ICD-10-CM | POA: Diagnosis not present

## 2021-06-27 DIAGNOSIS — I2510 Atherosclerotic heart disease of native coronary artery without angina pectoris: Secondary | ICD-10-CM | POA: Diagnosis not present

## 2021-06-27 DIAGNOSIS — Z6836 Body mass index (BMI) 36.0-36.9, adult: Secondary | ICD-10-CM | POA: Diagnosis not present

## 2021-06-28 ENCOUNTER — Other Ambulatory Visit: Payer: Self-pay | Admitting: Cardiology

## 2021-06-28 ENCOUNTER — Ambulatory Visit (HOSPITAL_BASED_OUTPATIENT_CLINIC_OR_DEPARTMENT_OTHER): Payer: PPO | Admitting: Physical Therapy

## 2021-06-28 ENCOUNTER — Encounter (HOSPITAL_BASED_OUTPATIENT_CLINIC_OR_DEPARTMENT_OTHER): Payer: Self-pay | Admitting: Physical Therapy

## 2021-06-28 DIAGNOSIS — M5459 Other low back pain: Secondary | ICD-10-CM

## 2021-06-28 DIAGNOSIS — R262 Difficulty in walking, not elsewhere classified: Secondary | ICD-10-CM
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
## 2021-06-30 ENCOUNTER — Ambulatory Visit
Admission: RE | Admit: 2021-06-30 | Discharge: 2021-06-30 | Disposition: A | Discharge status: Home / Self Care | Payer: PPO | Source: Ambulatory Visit | Attending: Neurosurgery | Admitting: Neurosurgery

## 2021-06-30 DIAGNOSIS — M4804 Spinal stenosis, thoracic region: Secondary | ICD-10-CM

## 2021-06-30 DIAGNOSIS — R200 Anesthesia of skin: Secondary | ICD-10-CM | POA: Diagnosis not present

## 2021-06-30 DIAGNOSIS — M48061 Spinal stenosis, lumbar region without neurogenic claudication: Secondary | ICD-10-CM | POA: Diagnosis not present

## 2021-06-30 DIAGNOSIS — M4316 Spondylolisthesis, lumbar region: Secondary | ICD-10-CM | POA: Diagnosis not present

## 2021-06-30 DIAGNOSIS — M4807 Spinal stenosis, lumbosacral region: Secondary | ICD-10-CM | POA: Diagnosis not present

## 2021-06-30 DIAGNOSIS — M545 Low back pain, unspecified: Secondary | ICD-10-CM | POA: Diagnosis not present

## 2021-06-30 DIAGNOSIS — M549 Dorsalgia, unspecified: Secondary | ICD-10-CM | POA: Diagnosis not present

## 2021-07-02 DIAGNOSIS — M25562 Pain in left knee: Secondary | ICD-10-CM | POA: Diagnosis not present

## 2021-07-02 DIAGNOSIS — R2242 Localized swelling, mass and lump, left lower limb: Secondary | ICD-10-CM | POA: Diagnosis not present

## 2021-07-02 DIAGNOSIS — Z6838 Body mass index (BMI) 38.0-38.9, adult: Secondary | ICD-10-CM | POA: Diagnosis not present

## 2021-07-02 DIAGNOSIS — R600 Localized edema: Secondary | ICD-10-CM | POA: Diagnosis not present

## 2021-07-03 ENCOUNTER — Ambulatory Visit (HOSPITAL_BASED_OUTPATIENT_CLINIC_OR_DEPARTMENT_OTHER): Payer: PPO | Admitting: Physical Therapy

## 2021-07-03 ENCOUNTER — Encounter (HOSPITAL_BASED_OUTPATIENT_CLINIC_OR_DEPARTMENT_OTHER): Payer: Self-pay | Admitting: Physical Therapy

## 2021-07-03 DIAGNOSIS — M5459 Other low back pain: Secondary | ICD-10-CM | POA: Diagnosis not present

## 2021-07-03 DIAGNOSIS — R262 Difficulty in walking, not elsewhere classified: Secondary | ICD-10-CM

## 2021-07-05 ENCOUNTER — Ambulatory Visit (HOSPITAL_BASED_OUTPATIENT_CLINIC_OR_DEPARTMENT_OTHER): Payer: PPO | Admitting: Physical Therapy

## 2021-07-05 DIAGNOSIS — Z8546 Personal history of malignant neoplasm of prostate: Secondary | ICD-10-CM | POA: Diagnosis not present

## 2021-07-05 DIAGNOSIS — Z6836 Body mass index (BMI) 36.0-36.9, adult: Secondary | ICD-10-CM | POA: Diagnosis not present

## 2021-07-05 DIAGNOSIS — M48062 Spinal stenosis, lumbar region with neurogenic claudication: Secondary | ICD-10-CM | POA: Diagnosis not present

## 2021-07-05 DIAGNOSIS — N5231 Erectile dysfunction following radical prostatectomy: Secondary | ICD-10-CM | POA: Diagnosis not present

## 2021-07-07 ENCOUNTER — Other Ambulatory Visit: Payer: PPO

## 2021-07-09 ENCOUNTER — Encounter (HOSPITAL_BASED_OUTPATIENT_CLINIC_OR_DEPARTMENT_OTHER): Payer: Self-pay | Admitting: Physical Therapy

## 2021-07-09 ENCOUNTER — Ambulatory Visit (HOSPITAL_BASED_OUTPATIENT_CLINIC_OR_DEPARTMENT_OTHER): Payer: PPO | Attending: Orthopedic Surgery | Admitting: Physical Therapy

## 2021-07-09 ENCOUNTER — Other Ambulatory Visit: Payer: Self-pay | Admitting: Cardiology

## 2021-07-09 DIAGNOSIS — M5459 Other low back pain: Secondary | ICD-10-CM | POA: Diagnosis not present

## 2021-07-09 DIAGNOSIS — R262 Difficulty in walking, not elsewhere classified: Secondary | ICD-10-CM | POA: Insufficient documentation
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
## 2021-07-18 ENCOUNTER — Encounter (HOSPITAL_BASED_OUTPATIENT_CLINIC_OR_DEPARTMENT_OTHER): Payer: Self-pay | Admitting: Physical Therapy

## 2021-07-18 ENCOUNTER — Ambulatory Visit (HOSPITAL_BASED_OUTPATIENT_CLINIC_OR_DEPARTMENT_OTHER): Payer: PPO | Admitting: Physical Therapy

## 2021-07-18 DIAGNOSIS — M5459 Other low back pain: Secondary | ICD-10-CM

## 2021-07-18 DIAGNOSIS — R262 Difficulty in walking, not elsewhere classified: Secondary | ICD-10-CM
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
## 2021-07-25 ENCOUNTER — Encounter (HOSPITAL_BASED_OUTPATIENT_CLINIC_OR_DEPARTMENT_OTHER): Payer: Self-pay | Admitting: Physical Therapy

## 2021-07-25 ENCOUNTER — Ambulatory Visit (HOSPITAL_BASED_OUTPATIENT_CLINIC_OR_DEPARTMENT_OTHER): Payer: PPO | Admitting: Physical Therapy

## 2021-07-25 DIAGNOSIS — R262 Difficulty in walking, not elsewhere classified: Secondary | ICD-10-CM

## 2021-07-25 DIAGNOSIS — M5459 Other low back pain: Secondary | ICD-10-CM
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
## 2021-07-27 DIAGNOSIS — M48062 Spinal stenosis, lumbar region with neurogenic claudication: Secondary | ICD-10-CM | POA: Diagnosis not present

## 2021-07-27 DIAGNOSIS — M5116 Intervertebral disc disorders with radiculopathy, lumbar region: Secondary | ICD-10-CM | POA: Diagnosis not present

## 2021-07-27 DIAGNOSIS — M5126 Other intervertebral disc displacement, lumbar region: Secondary | ICD-10-CM | POA: Diagnosis not present

## 2021-07-27 DIAGNOSIS — M47816 Spondylosis without myelopathy or radiculopathy, lumbar region: Secondary | ICD-10-CM | POA: Diagnosis not present

## 2021-08-03 DIAGNOSIS — M25462 Effusion, left knee: Secondary | ICD-10-CM | POA: Diagnosis not present

## 2021-08-03 DIAGNOSIS — M25562 Pain in left knee: Secondary | ICD-10-CM | POA: Diagnosis not present

## 2021-08-31 ENCOUNTER — Ambulatory Visit: Payer: PPO | Admitting: Podiatrist

## 2021-08-31 ENCOUNTER — Encounter: Payer: Self-pay | Admitting: Podiatrist

## 2021-08-31 DIAGNOSIS — L602 Onychogryphosis: Secondary | ICD-10-CM | POA: Diagnosis not present

## 2021-08-31 NOTE — Progress Notes (Signed)
Subjective: Jeffery [name] is a 71 y.o. male patient who presents to office today with concern of long,mildly painful nails  while ambulating in shoes; unable to trim.  He recently had back surgery with Dr. Trenton Gammon and has been unable to bend down to trim his toenails.  The back surgery overall went very well and has recently been cleared to go back to normal activities.  Patient denies any new cramping, numbness, burning or tingling in the legs or feet.  Patient Active Problem List   Diagnosis Date Noted   Incarcerated incisional hernia of left flank s/p lap repair w mesh 01/30/2017 01/30/2017   Bruit 04/10/2015   Abnormal stress echo    Coronary artery disease involving native coronary artery of native heart without angina pectoris    Malignant neoplasm of prostate ([place]) 12/09/2012   Unstable angina ([place]) 05/31/2011   Acute coronary syndrome ([place]) 05/30/2011   Hyperlipemia 03/08/2008   HYPERTENSION, BENIGN 03/08/2008   CAD, NATIVE VESSEL 03/08/2008   ROSACEA 03/08/2008   Current Outpatient Medications on File Prior to Visit  Medication Sig Dispense Refill   amLODipine (NORVASC) 10 MG tablet TAKE 1 TABLET EVERY DAY AT NIGHT 90 tablet 3   aspirin 81 MG tablet Take 81 mg by mouth daily.     atorvastatin (LIPITOR) 80 MG tablet TAKE 1 TABLET BY MOUTH EVERYDAY AT BEDTIME 90 tablet 1   carvedilol (COREG) 12.5 MG tablet TAKE 1 TABLET (12.5 MG TOTAL) BY MOUTH 2 (TWO) TIMES DAILY WITH A MEAL. 180 tablet 3   docusate sodium (COLACE) 100 MG capsule Take 100 mg by mouth 2 (two) times daily.      gabapentin (NEURONTIN) 300 MG capsule Take 300 mg by mouth 3 (three) times daily.     losartan (COZAAR) 100 MG tablet Take 1 tablet (100 mg total) by mouth daily. 90 tablet 1   nitroGLYCERIN (NITROSTAT) 0.4 MG SL tablet Take 1 tablet every 5 minutes as needed for chest pain.  Call 911 if third dose needed.  Do not mix with sildenafil. 25 tablet 6   Omega-3 Fatty Acids (FISH OIL) 1000 MG CAPS Take 2,000 mg by mouth  daily.     pantoprazole (PROTONIX) 40 MG tablet TAKE 1 TABLET EVERY DAY 30 tablet 5   sildenafil (REVATIO) 20 MG tablet Take 20-100 mg by mouth daily as needed (for ED).     traZODone (DESYREL) 50 MG tablet Take 25-50 mg by mouth at bedtime as needed for sleep.   1   No current facility-administered medications on file prior to visit.   No Known Allergies   Objective: General: Patient is awake, alert, and oriented x 3 and in no acute distress.  Integument: Skin is warm, dry and supple bilateral. Nails are tender, long, mildly thickened 1-5 bilateral. No signs of infection. No open lesions or preulcerative lesions present bilateral. Remaining integument unremarkable.  Vasculature:  Dorsalis Pedis pulse 2/4 bilateral. Posterior Tibial pulse  1/4 bilateral.  Capillary fill time <3 sec 1-5 bilateral. Positive hair growth to the level of the digits. Temperature gradient within normal limits. No varicosities present bilateral. No edema present bilateral.   Neurology: The patient has intact sensation measured with a 5.07/10g Semmes Weinstein Monofilament at all pedal sites bilateral . Vibratory sensation intact bilateral with tuning fork. No Babinski sign present bilateral.   Musculoskeletal: No symptomatic pedal deformities noted bilateral. Muscular strength 5/5 in all lower extremity muscular groups bilateral without pain on range of motion . No tenderness with  calf compression bilateral.  Assessment and Plan: Problem List Items Addressed This Visit   None   -Examined patient. -Mechanically debrided all nails 1-5 bilateral using sterile nail nipper and filed with sterile dremel without incident  -Answered all patient questions -Patient advised to call the office if any problems or questions arise in the future Bronson Ing, DPM

## 2021-09-13 ENCOUNTER — Other Ambulatory Visit: Payer: Self-pay

## 2021-09-13 ENCOUNTER — Encounter: Payer: Self-pay | Admitting: Physical Therapy

## 2021-09-13 ENCOUNTER — Ambulatory Visit: Payer: PPO | Attending: Neurosurgery | Admitting: Physical Therapy

## 2021-09-13 DIAGNOSIS — M5459 Other low back pain: Secondary | ICD-10-CM

## 2021-09-13 DIAGNOSIS — M6281 Muscle weakness (generalized): Secondary | ICD-10-CM

## 2021-09-13 DIAGNOSIS — R262 Difficulty in walking, not elsewhere classified: Secondary | ICD-10-CM | POA: Diagnosis not present

## 2021-09-13 DIAGNOSIS — M48062 Spinal stenosis, lumbar region with neurogenic claudication: Secondary | ICD-10-CM | POA: Insufficient documentation
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
## 2021-09-17 ENCOUNTER — Ambulatory Visit: Payer: PPO | Admitting: Physical Therapy

## 2021-09-17 ENCOUNTER — Encounter: Payer: Self-pay | Admitting: Physical Therapy

## 2021-09-17 DIAGNOSIS — M48062 Spinal stenosis, lumbar region with neurogenic claudication: Secondary | ICD-10-CM | POA: Diagnosis not present

## 2021-09-17 DIAGNOSIS — M5459 Other low back pain: Secondary | ICD-10-CM

## 2021-09-17 DIAGNOSIS — M6281 Muscle weakness (generalized): Secondary | ICD-10-CM

## 2021-09-17 DIAGNOSIS — R262 Difficulty in walking, not elsewhere classified: Secondary | ICD-10-CM
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
## 2021-09-20 DIAGNOSIS — M25562 Pain in left knee: Secondary | ICD-10-CM | POA: Diagnosis not present

## 2021-09-26 ENCOUNTER — Ambulatory Visit: Payer: PPO | Admitting: Physical Therapy

## 2021-09-28 ENCOUNTER — Ambulatory Visit: Payer: PPO | Admitting: Physical Therapy

## 2021-09-29 DIAGNOSIS — M25562 Pain in left knee: Secondary | ICD-10-CM | POA: Diagnosis not present

## 2021-10-03 ENCOUNTER — Ambulatory Visit: Payer: PPO | Admitting: Physical Therapy

## 2021-10-05 ENCOUNTER — Ambulatory Visit: Payer: PPO | Admitting: Physical Therapy

## 2021-10-06 DIAGNOSIS — E669 Obesity, unspecified: Secondary | ICD-10-CM | POA: Diagnosis not present

## 2021-10-09 ENCOUNTER — Ambulatory Visit: Payer: PPO | Attending: Neurosurgery | Admitting: Physical Therapy

## 2021-10-09 ENCOUNTER — Encounter: Payer: Self-pay | Admitting: Physical Therapy

## 2021-10-09 DIAGNOSIS — M6281 Muscle weakness (generalized): Secondary | ICD-10-CM | POA: Diagnosis not present

## 2021-10-09 DIAGNOSIS — M5459 Other low back pain: Secondary | ICD-10-CM | POA: Diagnosis not present

## 2021-10-09 DIAGNOSIS — R262 Difficulty in walking, not elsewhere classified: Secondary | ICD-10-CM | POA: Diagnosis not present
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
## 2021-10-11 ENCOUNTER — Encounter: Payer: PPO | Admitting: Physical Therapy

## 2021-10-11 DIAGNOSIS — M1712 Unilateral primary osteoarthritis, left knee: Secondary | ICD-10-CM | POA: Diagnosis not present
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
## 2021-10-12 ENCOUNTER — Encounter: Payer: Self-pay | Admitting: Physical Therapy

## 2021-10-12 ENCOUNTER — Ambulatory Visit: Payer: PPO | Admitting: Physical Therapy

## 2021-10-12 DIAGNOSIS — R262 Difficulty in walking, not elsewhere classified: Secondary | ICD-10-CM

## 2021-10-12 DIAGNOSIS — M5459 Other low back pain: Secondary | ICD-10-CM

## 2021-10-12 DIAGNOSIS — M6281 Muscle weakness (generalized): Secondary | ICD-10-CM

## 2021-10-16 ENCOUNTER — Ambulatory Visit: Payer: PPO | Admitting: Physical Therapy

## 2021-10-18 ENCOUNTER — Ambulatory Visit: Payer: PPO | Admitting: Physical Therapy

## 2021-10-23 ENCOUNTER — Ambulatory Visit: Payer: PPO | Admitting: Physical Therapy

## 2021-10-23 ENCOUNTER — Encounter: Payer: Self-pay | Admitting: Physical Therapy

## 2021-10-23 DIAGNOSIS — M5459 Other low back pain: Secondary | ICD-10-CM

## 2021-10-23 DIAGNOSIS — M6281 Muscle weakness (generalized): Secondary | ICD-10-CM

## 2021-10-23 DIAGNOSIS — R262 Difficulty in walking, not elsewhere classified: Secondary | ICD-10-CM
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
## 2021-10-25 ENCOUNTER — Encounter: Payer: Self-pay | Admitting: Physical Therapy

## 2021-10-25 ENCOUNTER — Ambulatory Visit: Payer: PPO | Admitting: Physical Therapy

## 2021-10-25 DIAGNOSIS — M6281 Muscle weakness (generalized): Secondary | ICD-10-CM

## 2021-10-25 DIAGNOSIS — M5459 Other low back pain: Secondary | ICD-10-CM

## 2021-10-25 DIAGNOSIS — R262 Difficulty in walking, not elsewhere classified: Secondary | ICD-10-CM
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
## 2021-10-30 ENCOUNTER — Encounter: Payer: Self-pay | Admitting: Physical Therapy

## 2021-10-30 ENCOUNTER — Ambulatory Visit: Payer: PPO | Admitting: Physical Therapy

## 2021-10-30 DIAGNOSIS — R262 Difficulty in walking, not elsewhere classified: Secondary | ICD-10-CM

## 2021-10-30 DIAGNOSIS — M6281 Muscle weakness (generalized): Secondary | ICD-10-CM

## 2021-10-30 DIAGNOSIS — M5459 Other low back pain: Secondary | ICD-10-CM
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
## 2021-11-01 ENCOUNTER — Ambulatory Visit: Payer: PPO | Admitting: Physical Therapy

## 2021-11-01 ENCOUNTER — Encounter: Payer: Self-pay | Admitting: Physical Therapy

## 2021-11-01 DIAGNOSIS — M6281 Muscle weakness (generalized): Secondary | ICD-10-CM

## 2021-11-01 DIAGNOSIS — M5459 Other low back pain: Secondary | ICD-10-CM

## 2021-11-01 DIAGNOSIS — R262 Difficulty in walking, not elsewhere classified: Secondary | ICD-10-CM
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
## 2021-11-06 ENCOUNTER — Encounter: Payer: Self-pay | Admitting: Physical Therapy

## 2021-11-06 ENCOUNTER — Ambulatory Visit: Payer: PPO | Admitting: Physical Therapy

## 2021-11-06 DIAGNOSIS — M6281 Muscle weakness (generalized): Secondary | ICD-10-CM | POA: Diagnosis not present

## 2021-11-06 DIAGNOSIS — E669 Obesity, unspecified: Secondary | ICD-10-CM | POA: Diagnosis not present

## 2021-11-06 DIAGNOSIS — R262 Difficulty in walking, not elsewhere classified: Secondary | ICD-10-CM

## 2021-11-06 DIAGNOSIS — M5459 Other low back pain: Secondary | ICD-10-CM
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
## 2021-11-08 ENCOUNTER — Ambulatory Visit: Payer: PPO | Attending: Neurosurgery | Admitting: Physical Therapy

## 2021-11-08 ENCOUNTER — Encounter: Payer: Self-pay | Admitting: Physical Therapy

## 2021-11-08 DIAGNOSIS — M6281 Muscle weakness (generalized): Secondary | ICD-10-CM | POA: Diagnosis not present

## 2021-11-08 DIAGNOSIS — R262 Difficulty in walking, not elsewhere classified: Secondary | ICD-10-CM | POA: Insufficient documentation

## 2021-11-08 DIAGNOSIS — M5459 Other low back pain: Secondary | ICD-10-CM | POA: Insufficient documentation
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
## 2021-11-14 ENCOUNTER — Ambulatory Visit: Payer: PPO | Admitting: Physical Therapy

## 2021-11-20 ENCOUNTER — Telehealth: Payer: Self-pay

## 2021-11-20 NOTE — Telephone Encounter (Signed)
   Pre-operative Risk Assessment    Patient Name: Earlie Schank Flemmer  DOB: 12-29-1950 MRN: [id number]      Request for Surgical Clearance    Procedure:   Left Total Knee Arthroplasty  Date of Surgery:  Clearance 02/20/22                                 Surgeon:  Dr. Gaynelle Arabian Surgeon's Group or Practice Name:  [facility] Phone number:  [phone] Fax number:  [phone] [place]   Type of Clearance Requested:   - Medical    Type of Anesthesia:   Choice   Additional requests/questions:  Please advise surgeon/provider what medications should be held.  Signed, Elsie Lincoln Chyla Schlender   11/20/2021, 2:45 PM

## 2021-11-21 ENCOUNTER — Encounter: Payer: PPO | Admitting: Physical Therapy

## 2021-11-22 NOTE — Telephone Encounter (Signed)
   Name: Jeffery [name]  DOB: [birth year]  MRN: [id number]  Primary Cardiologist: Jeffery Ruths, MD  Chart reviewed as part of pre-operative protocol coverage. Because of Jeffery [name]'s past medical history and time since last visit, he will require a follow-up in-office visit on or after 12/20/2021 in order to better assess preoperative cardiovascular risk.  Pre-op covering staff: - Please schedule appointment and call patient to inform them. If patient already had an upcoming appointment within acceptable timeframe, please add "pre-op clearance" to the appointment notes so provider is aware. - Please contact requesting surgeon's office via preferred method (i.e, phone, fax) to inform them of need for appointment prior to surgery.    Lenna Sciara, NP  11/22/2021, 12:02 PM

## 2021-11-22 NOTE — Telephone Encounter (Signed)
Pt has been scheduled to see Diona Browner, NP, 12/05/21, clearance will be addressed that time.  Will route to requesting surgeon's office to make them aware.

## 2021-11-28 ENCOUNTER — Encounter: Payer: PPO | Admitting: Physical Therapy

## 2021-12-05 ENCOUNTER — Encounter: Payer: Self-pay | Admitting: Nurse Practitioner

## 2021-12-05 ENCOUNTER — Ambulatory Visit: Payer: PPO | Attending: Nurse Practitioner | Admitting: Nurse Practitioner

## 2021-12-05 ENCOUNTER — Encounter: Payer: PPO | Admitting: Physical Therapy

## 2021-12-05 VITALS — BP 128/70 | HR 58 | Ht 69.5 in | Wt 255.5 lb

## 2021-12-05 DIAGNOSIS — E785 Hyperlipidemia, unspecified: Secondary | ICD-10-CM

## 2021-12-05 DIAGNOSIS — I10 Essential (primary) hypertension: Secondary | ICD-10-CM

## 2021-12-05 DIAGNOSIS — I255 Ischemic cardiomyopathy: Secondary | ICD-10-CM

## 2021-12-05 DIAGNOSIS — Z01810 Encounter for preprocedural cardiovascular examination: Secondary | ICD-10-CM

## 2021-12-05 DIAGNOSIS — I2510 Atherosclerotic heart disease of native coronary artery without angina pectoris: Secondary | ICD-10-CM

## 2021-12-05 MED ORDER — NITROGLYCERIN 0.4 MG SL SUBL: SUBLINGUAL_TABLET | SUBLINGUAL | 6 refills | Status: DC
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
## 2021-12-05 NOTE — Patient Instructions (Signed)
Medication Instructions:  Your physician recommends that you continue on your current medications as directed. Please refer to the Current Medication list given to you today.  *If you need a refill on your cardiac medications before your next appointment, please call your pharmacy*   Lab Work: NONE ordered at this time of appointment   If you have labs (blood work) drawn today and your tests are completely normal, you will receive your results only by: [place] (if you have MyChart) OR A paper copy in the mail If you have any lab test that is abnormal or we need to change your treatment, we will call you to review the results.   Testing/Procedures: NONE ordered at this time of appointment   Follow-Up: At [facility], you and your health needs are our priority.  As part of our continuing mission to provide you with exceptional heart care, we have created designated Provider Care Teams.  These Care Teams include your primary Cardiologist (physician) and Advanced Practice Providers (APPs -  Physician Assistants and Nurse Practitioners) who all work together to provide you with the care you need, when you need it.  We recommend signing up for the patient portal called "MyChart".  Sign up information is provided on this After Visit Summary.  MyChart is used to connect with patients for Virtual Visits (Telemedicine).  Patients are able to view lab/test results, encounter notes, upcoming appointments, etc.  Non-urgent messages can be sent to your provider as well.   To learn more about what you can do with MyChart, go to [email or web address].    Your next appointment:   1 year(s)  The format for your next appointment:   In Person  Provider:   Kirk Ruths, MD     Other Instructions   Important Information About Sugar

## 2021-12-06 DIAGNOSIS — E669 Obesity, unspecified: Secondary | ICD-10-CM | POA: Diagnosis not present

## 2021-12-10 ENCOUNTER — Telehealth: Payer: Self-pay | Admitting: Cardiology

## 2021-12-10 NOTE — Telephone Encounter (Signed)
Patient returned RN's call. 

## 2021-12-10 NOTE — Telephone Encounter (Signed)
I s/w the pt and stated that I saw that we cleared him 12/05/21 for pre op clearance with Diona Browner, NP. Pt states that someone from our office called about 10-15 minutes ago.  I informed the pt that I do not see who called him. Pt said he just wanted to make sure that everything was still good with the pre op clearance,. I assured the pt that he is cleared for his procedure. Pt said thank you.

## 2021-12-12 ENCOUNTER — Encounter: Payer: PPO | Admitting: Physical Therapy

## 2021-12-19 ENCOUNTER — Encounter: Payer: PPO | Admitting: Physical Therapy

## 2022-01-07 ENCOUNTER — Other Ambulatory Visit: Payer: Self-pay | Admitting: Cardiology

## 2022-01-14 ENCOUNTER — Other Ambulatory Visit: Payer: Self-pay | Admitting: Cardiology

## 2022-02-05 ENCOUNTER — Encounter: Payer: Self-pay | Admitting: Physical Therapy

## 2022-02-05 ENCOUNTER — Ambulatory Visit: Payer: PPO | Attending: Neurosurgery | Admitting: Physical Therapy

## 2022-02-05 ENCOUNTER — Other Ambulatory Visit: Payer: Self-pay

## 2022-02-05 DIAGNOSIS — M6281 Muscle weakness (generalized): Secondary | ICD-10-CM | POA: Insufficient documentation

## 2022-02-05 DIAGNOSIS — M5459 Other low back pain: Secondary | ICD-10-CM | POA: Insufficient documentation

## 2022-02-05 DIAGNOSIS — R262 Difficulty in walking, not elsewhere classified: Secondary | ICD-10-CM | POA: Insufficient documentation
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
## 2022-02-06 ENCOUNTER — Ambulatory Visit: Payer: PPO | Admitting: Physical Therapy

## 2022-02-06 ENCOUNTER — Encounter: Payer: Self-pay | Admitting: Physical Therapy

## 2022-02-06 DIAGNOSIS — M6281 Muscle weakness (generalized): Secondary | ICD-10-CM

## 2022-02-06 DIAGNOSIS — M5459 Other low back pain: Secondary | ICD-10-CM

## 2022-02-06 DIAGNOSIS — R262 Difficulty in walking, not elsewhere classified: Secondary | ICD-10-CM
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
## 2022-02-08 DIAGNOSIS — M25561 Pain in right knee: Secondary | ICD-10-CM | POA: Diagnosis not present

## 2022-02-12 ENCOUNTER — Encounter: Payer: Self-pay | Admitting: Physical Therapy

## 2022-02-12 ENCOUNTER — Ambulatory Visit: Payer: PPO | Attending: Neurosurgery | Admitting: Physical Therapy

## 2022-02-12 DIAGNOSIS — R262 Difficulty in walking, not elsewhere classified: Secondary | ICD-10-CM | POA: Insufficient documentation

## 2022-02-12 DIAGNOSIS — R252 Cramp and spasm: Secondary | ICD-10-CM | POA: Insufficient documentation

## 2022-02-12 DIAGNOSIS — M6281 Muscle weakness (generalized): Secondary | ICD-10-CM | POA: Diagnosis not present

## 2022-02-12 DIAGNOSIS — M5459 Other low back pain: Secondary | ICD-10-CM | POA: Insufficient documentation
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
## 2022-02-13 ENCOUNTER — Ambulatory Visit: Payer: PPO | Admitting: Physical Therapy

## 2022-02-13 DIAGNOSIS — J069 Acute upper respiratory infection, unspecified: Secondary | ICD-10-CM | POA: Diagnosis not present

## 2022-02-13 DIAGNOSIS — Z03818 Encounter for observation for suspected exposure to other biological agents ruled out: Secondary | ICD-10-CM | POA: Diagnosis not present

## 2022-02-13 DIAGNOSIS — J029 Acute pharyngitis, unspecified: Secondary | ICD-10-CM | POA: Diagnosis not present

## 2022-02-13 DIAGNOSIS — R051 Acute cough: Secondary | ICD-10-CM | POA: Diagnosis not present

## 2022-02-13 DIAGNOSIS — U071 COVID-19: Secondary | ICD-10-CM | POA: Diagnosis not present

## 2022-02-13 DIAGNOSIS — Z6838 Body mass index (BMI) 38.0-38.9, adult: Secondary | ICD-10-CM | POA: Diagnosis not present

## 2022-02-19 ENCOUNTER — Ambulatory Visit: Payer: PPO | Admitting: Physical Therapy

## 2022-02-25 ENCOUNTER — Ambulatory Visit: Payer: PPO | Admitting: Rehabilitative and Restorative Service Providers"

## 2022-02-26 DIAGNOSIS — J208 Acute bronchitis due to other specified organisms: Secondary | ICD-10-CM | POA: Diagnosis not present

## 2022-02-26 DIAGNOSIS — Z6839 Body mass index (BMI) 39.0-39.9, adult: Secondary | ICD-10-CM | POA: Diagnosis not present

## 2022-02-27 ENCOUNTER — Ambulatory Visit: Payer: PPO | Admitting: Physical Therapy

## 2022-02-27 ENCOUNTER — Encounter: Payer: Self-pay | Admitting: Physical Therapy

## 2022-02-27 DIAGNOSIS — M5459 Other low back pain: Secondary | ICD-10-CM | POA: Diagnosis not present

## 2022-02-27 DIAGNOSIS — M6281 Muscle weakness (generalized): Secondary | ICD-10-CM
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
## 2022-03-06 ENCOUNTER — Ambulatory Visit: Payer: PPO

## 2022-03-06 DIAGNOSIS — M5459 Other low back pain: Secondary | ICD-10-CM | POA: Diagnosis not present

## 2022-03-06 DIAGNOSIS — M6281 Muscle weakness (generalized): Secondary | ICD-10-CM

## 2022-03-06 DIAGNOSIS — R252 Cramp and spasm: Secondary | ICD-10-CM

## 2022-03-06 DIAGNOSIS — R262 Difficulty in walking, not elsewhere classified: Secondary | ICD-10-CM
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
## 2022-03-20 ENCOUNTER — Encounter: Payer: Self-pay | Admitting: Physical Therapy

## 2022-03-20 ENCOUNTER — Ambulatory Visit: Payer: PPO | Attending: Neurosurgery | Admitting: Physical Therapy

## 2022-03-20 DIAGNOSIS — R262 Difficulty in walking, not elsewhere classified: Secondary | ICD-10-CM | POA: Insufficient documentation

## 2022-03-20 DIAGNOSIS — M6281 Muscle weakness (generalized): Secondary | ICD-10-CM

## 2022-03-20 DIAGNOSIS — M5459 Other low back pain: Secondary | ICD-10-CM | POA: Diagnosis not present

## 2022-03-20 DIAGNOSIS — R252 Cramp and spasm: Secondary | ICD-10-CM | POA: Diagnosis not present
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
## 2022-03-27 ENCOUNTER — Encounter: Payer: Self-pay | Admitting: Physical Therapy

## 2022-03-27 ENCOUNTER — Ambulatory Visit: Payer: PPO | Admitting: Physical Therapy

## 2022-03-27 DIAGNOSIS — M6281 Muscle weakness (generalized): Secondary | ICD-10-CM

## 2022-03-27 DIAGNOSIS — M5459 Other low back pain: Secondary | ICD-10-CM | POA: Diagnosis not present
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
## 2022-03-28 DIAGNOSIS — I10 Essential (primary) hypertension: Secondary | ICD-10-CM | POA: Diagnosis not present

## 2022-03-28 DIAGNOSIS — E7800 Pure hypercholesterolemia, unspecified: Secondary | ICD-10-CM | POA: Diagnosis not present

## 2022-04-02 ENCOUNTER — Ambulatory Visit: Payer: PPO

## 2022-04-02 DIAGNOSIS — R262 Difficulty in walking, not elsewhere classified: Secondary | ICD-10-CM

## 2022-04-02 DIAGNOSIS — M6281 Muscle weakness (generalized): Secondary | ICD-10-CM

## 2022-04-02 DIAGNOSIS — M5459 Other low back pain: Secondary | ICD-10-CM | POA: Diagnosis not present

## 2022-04-02 DIAGNOSIS — R252 Cramp and spasm: Secondary | ICD-10-CM
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
## 2022-04-03 DIAGNOSIS — M1712 Unilateral primary osteoarthritis, left knee: Secondary | ICD-10-CM | POA: Diagnosis not present

## 2022-04-03 DIAGNOSIS — Z96652 Presence of left artificial knee joint: Secondary | ICD-10-CM | POA: Diagnosis not present

## 2022-04-03 DIAGNOSIS — G8918 Other acute postprocedural pain: Secondary | ICD-10-CM | POA: Diagnosis not present

## 2022-04-08 ENCOUNTER — Ambulatory Visit: Payer: PPO | Attending: Orthopedic Surgery | Admitting: Rehabilitative and Restorative Service Providers"

## 2022-04-08 ENCOUNTER — Other Ambulatory Visit: Payer: Self-pay

## 2022-04-08 ENCOUNTER — Encounter: Payer: Self-pay | Admitting: Rehabilitative and Restorative Service Providers"

## 2022-04-08 DIAGNOSIS — M6281 Muscle weakness (generalized): Secondary | ICD-10-CM | POA: Diagnosis not present

## 2022-04-08 DIAGNOSIS — M25562 Pain in left knee: Secondary | ICD-10-CM | POA: Diagnosis not present

## 2022-04-08 DIAGNOSIS — R262 Difficulty in walking, not elsewhere classified: Secondary | ICD-10-CM | POA: Insufficient documentation

## 2022-04-08 DIAGNOSIS — R252 Cramp and spasm: Secondary | ICD-10-CM | POA: Insufficient documentation
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
## 2022-04-10 ENCOUNTER — Encounter: Payer: Self-pay | Admitting: Physical Therapy

## 2022-04-10 ENCOUNTER — Ambulatory Visit: Payer: PPO | Admitting: Physical Therapy

## 2022-04-10 DIAGNOSIS — R262 Difficulty in walking, not elsewhere classified: Secondary | ICD-10-CM | POA: Diagnosis not present

## 2022-04-10 DIAGNOSIS — M25562 Pain in left knee: Secondary | ICD-10-CM

## 2022-04-10 DIAGNOSIS — M6281 Muscle weakness (generalized): Secondary | ICD-10-CM
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
## 2022-04-16 ENCOUNTER — Ambulatory Visit: Payer: PPO | Admitting: Physical Therapy

## 2022-04-16 ENCOUNTER — Encounter: Payer: Self-pay | Admitting: Physical Therapy

## 2022-04-16 DIAGNOSIS — M25562 Pain in left knee: Secondary | ICD-10-CM

## 2022-04-16 DIAGNOSIS — R262 Difficulty in walking, not elsewhere classified: Secondary | ICD-10-CM

## 2022-04-16 DIAGNOSIS — M6281 Muscle weakness (generalized): Secondary | ICD-10-CM
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
## 2022-04-18 ENCOUNTER — Ambulatory Visit: Payer: PPO | Admitting: Physical Therapy

## 2022-04-18 ENCOUNTER — Encounter: Payer: Self-pay | Admitting: Physical Therapy

## 2022-04-18 DIAGNOSIS — M6281 Muscle weakness (generalized): Secondary | ICD-10-CM

## 2022-04-18 DIAGNOSIS — R262 Difficulty in walking, not elsewhere classified: Secondary | ICD-10-CM | POA: Diagnosis not present

## 2022-04-18 DIAGNOSIS — M25562 Pain in left knee: Secondary | ICD-10-CM
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
## 2022-04-23 ENCOUNTER — Encounter: Payer: Self-pay | Admitting: Physical Therapy

## 2022-04-23 ENCOUNTER — Ambulatory Visit: Payer: PPO | Admitting: Physical Therapy

## 2022-04-23 DIAGNOSIS — R262 Difficulty in walking, not elsewhere classified: Secondary | ICD-10-CM | POA: Diagnosis not present

## 2022-04-23 DIAGNOSIS — M6281 Muscle weakness (generalized): Secondary | ICD-10-CM

## 2022-04-23 DIAGNOSIS — R252 Cramp and spasm: Secondary | ICD-10-CM

## 2022-04-23 DIAGNOSIS — M25562 Pain in left knee: Secondary | ICD-10-CM
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
## 2022-04-25 ENCOUNTER — Ambulatory Visit: Payer: PPO | Admitting: Physical Therapy

## 2022-04-25 ENCOUNTER — Encounter: Payer: Self-pay | Admitting: Physical Therapy

## 2022-04-25 DIAGNOSIS — M6281 Muscle weakness (generalized): Secondary | ICD-10-CM

## 2022-04-25 DIAGNOSIS — M25562 Pain in left knee: Secondary | ICD-10-CM

## 2022-04-25 DIAGNOSIS — R262 Difficulty in walking, not elsewhere classified: Secondary | ICD-10-CM | POA: Diagnosis not present
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
## 2022-04-30 ENCOUNTER — Ambulatory Visit: Payer: PPO | Admitting: Physical Therapy

## 2022-04-30 ENCOUNTER — Encounter: Payer: Self-pay | Admitting: Physical Therapy

## 2022-04-30 DIAGNOSIS — R262 Difficulty in walking, not elsewhere classified: Secondary | ICD-10-CM

## 2022-04-30 DIAGNOSIS — M6281 Muscle weakness (generalized): Secondary | ICD-10-CM

## 2022-04-30 DIAGNOSIS — Z0000 Encounter for general adult medical examination without abnormal findings: Secondary | ICD-10-CM | POA: Diagnosis not present

## 2022-04-30 DIAGNOSIS — M25562 Pain in left knee: Secondary | ICD-10-CM
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
## 2022-05-02 ENCOUNTER — Encounter: Payer: Self-pay | Admitting: Physical Therapy

## 2022-05-02 ENCOUNTER — Ambulatory Visit: Payer: PPO | Admitting: Physical Therapy

## 2022-05-02 DIAGNOSIS — M6281 Muscle weakness (generalized): Secondary | ICD-10-CM

## 2022-05-02 DIAGNOSIS — R262 Difficulty in walking, not elsewhere classified: Secondary | ICD-10-CM | POA: Diagnosis not present

## 2022-05-02 DIAGNOSIS — M25562 Pain in left knee: Secondary | ICD-10-CM
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
## 2022-05-06 ENCOUNTER — Telehealth: Payer: Self-pay | Admitting: Cardiology

## 2022-05-06 DIAGNOSIS — G479 Sleep disorder, unspecified: Secondary | ICD-10-CM | POA: Diagnosis not present

## 2022-05-06 DIAGNOSIS — I700 Atherosclerosis of aorta: Secondary | ICD-10-CM | POA: Diagnosis not present

## 2022-05-06 DIAGNOSIS — N529 Male erectile dysfunction, unspecified: Secondary | ICD-10-CM | POA: Diagnosis not present

## 2022-05-06 DIAGNOSIS — K219 Gastro-esophageal reflux disease without esophagitis: Secondary | ICD-10-CM | POA: Diagnosis not present

## 2022-05-06 DIAGNOSIS — I10 Essential (primary) hypertension: Secondary | ICD-10-CM | POA: Diagnosis not present

## 2022-05-06 DIAGNOSIS — I959 Hypotension, unspecified: Secondary | ICD-10-CM | POA: Diagnosis not present

## 2022-05-06 DIAGNOSIS — Z0000 Encounter for general adult medical examination without abnormal findings: Secondary | ICD-10-CM | POA: Diagnosis not present

## 2022-05-06 DIAGNOSIS — Z6838 Body mass index (BMI) 38.0-38.9, adult: Secondary | ICD-10-CM | POA: Diagnosis not present

## 2022-05-06 NOTE — Telephone Encounter (Signed)
Left message to call back  

## 2022-05-06 NOTE — Telephone Encounter (Signed)
Returned call to patient-patient states he saw Dr. Tenny Craw today for his annual wellness visit and BP was 96/57.   He states he had a visit 2 weeks ago and BP was 101/57.   He states he had a total knee replacement 5 weeks ago and since has lost 17 lbs and not eating/hydrating well d/t no appetite.  Dr. Tenny Craw recommended decreasing his amlodipine to 5 mg, monitoring his BP at home and contacting cardiology.   Patient reports Dr. Tenny Craw believes this is due to weight loss and decreased PO intake.  Patient does report getting dizzy, lightheaded with movement/position changes.    Advised to stay hydrated, monitor BP at home and contact if BP consistently elevated with medication decrease or if symptoms continue.   Advised would send to Dr. Jens Som to review/further recommendations if needed.

## 2022-05-06 NOTE — Telephone Encounter (Signed)
Patient is returning RN's call. Please advise. 

## 2022-05-06 NOTE — Telephone Encounter (Signed)
Patient calling to speak to the nurse about his medication change. Please advise

## 2022-05-07 ENCOUNTER — Encounter: Payer: Self-pay | Admitting: Physical Therapy

## 2022-05-07 ENCOUNTER — Ambulatory Visit: Payer: PPO | Admitting: Physical Therapy

## 2022-05-07 DIAGNOSIS — Z5189 Encounter for other specified aftercare: Secondary | ICD-10-CM | POA: Diagnosis not present

## 2022-05-07 DIAGNOSIS — M25562 Pain in left knee: Secondary | ICD-10-CM

## 2022-05-07 DIAGNOSIS — M6281 Muscle weakness (generalized): Secondary | ICD-10-CM

## 2022-05-07 DIAGNOSIS — R262 Difficulty in walking, not elsewhere classified: Secondary | ICD-10-CM | POA: Diagnosis not present

## 2022-05-07 MED ORDER — AMLODIPINE BESYLATE 5 MG PO TABS: 5.0000 mg | ORAL_TABLET | Freq: Every day | ORAL | Status: DC
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
## 2022-05-07 NOTE — Telephone Encounter (Signed)
Spoke with pt, his blood pressure today is running a little better. He is making sure to drink plenty of water. He has still been getting some dizziness but not as bad. He will let us know how he is doing.

## 2022-05-07 NOTE — Addendum Note (Signed)
Addended by: Freddi Starr on: 05/07/2022 01:46 PM   Modules accepted: Orders

## 2022-05-09 ENCOUNTER — Encounter: Payer: Self-pay | Admitting: Physical Therapy

## 2022-05-09 ENCOUNTER — Ambulatory Visit: Payer: PPO | Attending: Neurosurgery | Admitting: Physical Therapy

## 2022-05-09 DIAGNOSIS — M6281 Muscle weakness (generalized): Secondary | ICD-10-CM | POA: Diagnosis not present

## 2022-05-09 DIAGNOSIS — M25562 Pain in left knee: Secondary | ICD-10-CM | POA: Insufficient documentation
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
## 2022-05-14 ENCOUNTER — Ambulatory Visit: Payer: PPO | Attending: Neurosurgery | Admitting: Physical Therapy

## 2022-05-14 ENCOUNTER — Encounter: Payer: Self-pay | Admitting: Physical Therapy

## 2022-05-14 DIAGNOSIS — M6281 Muscle weakness (generalized): Secondary | ICD-10-CM | POA: Diagnosis not present

## 2022-05-14 DIAGNOSIS — M25562 Pain in left knee: Secondary | ICD-10-CM | POA: Diagnosis not present
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
## 2022-05-16 ENCOUNTER — Ambulatory Visit: Payer: PPO | Admitting: Physical Therapy

## 2022-05-16 ENCOUNTER — Encounter: Payer: Self-pay | Admitting: Physical Therapy

## 2022-05-16 DIAGNOSIS — M6281 Muscle weakness (generalized): Secondary | ICD-10-CM

## 2022-05-16 DIAGNOSIS — M25562 Pain in left knee: Secondary | ICD-10-CM
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
## 2022-05-21 ENCOUNTER — Ambulatory Visit: Payer: PPO | Admitting: Physical Therapy

## 2022-05-21 ENCOUNTER — Encounter: Payer: Self-pay | Admitting: Physical Therapy

## 2022-05-21 DIAGNOSIS — M25562 Pain in left knee: Secondary | ICD-10-CM | POA: Diagnosis not present

## 2022-05-21 DIAGNOSIS — M6281 Muscle weakness (generalized): Secondary | ICD-10-CM
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
## 2022-05-23 ENCOUNTER — Encounter: Payer: Self-pay | Admitting: Physical Therapy

## 2022-05-23 ENCOUNTER — Ambulatory Visit: Payer: PPO | Admitting: Physical Therapy

## 2022-05-23 DIAGNOSIS — M25562 Pain in left knee: Secondary | ICD-10-CM | POA: Diagnosis not present

## 2022-05-23 DIAGNOSIS — M6281 Muscle weakness (generalized): Secondary | ICD-10-CM
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
## 2022-05-28 ENCOUNTER — Encounter: Payer: Self-pay | Admitting: Physical Therapy

## 2022-05-28 ENCOUNTER — Ambulatory Visit: Payer: PPO | Admitting: Physical Therapy

## 2022-05-28 DIAGNOSIS — M25562 Pain in left knee: Secondary | ICD-10-CM

## 2022-05-28 DIAGNOSIS — M6281 Muscle weakness (generalized): Secondary | ICD-10-CM
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
## 2022-05-30 ENCOUNTER — Ambulatory Visit: Payer: PPO | Admitting: Physical Therapy

## 2022-06-04 ENCOUNTER — Encounter: Payer: PPO | Admitting: Physical Therapy

## 2022-06-06 ENCOUNTER — Encounter: Payer: PPO | Admitting: Physical Therapy

## 2022-06-19 ENCOUNTER — Ambulatory Visit: Payer: PPO | Admitting: Podiatry

## 2022-06-19 ENCOUNTER — Encounter: Payer: Self-pay | Admitting: Podiatry

## 2022-06-19 DIAGNOSIS — M79674 Pain in right toe(s): Secondary | ICD-10-CM

## 2022-06-19 DIAGNOSIS — B351 Tinea unguium: Secondary | ICD-10-CM

## 2022-06-19 DIAGNOSIS — L600 Ingrowing nail: Secondary | ICD-10-CM | POA: Diagnosis not present

## 2022-06-19 DIAGNOSIS — M79675 Pain in left toe(s): Secondary | ICD-10-CM | POA: Diagnosis not present

## 2022-06-19 NOTE — Patient Instructions (Signed)

## 2022-06-19 NOTE — Progress Notes (Signed)
Subjective:   Patient ID: Jeffery [name], male   DOB: 72 y.o.   MRN: [id number]   HPI Patient states he had knee surgery cannot cut his toenails and they are getting sore and has developed an ingrown toenail of the second right that is painful.  Had knee surgery 6 weeks ago   ROS      Objective:  Physical Exam  Neurovascular status intact incurvation of the second digit right medial border painful when pressed with the elongated nailbeds incurvated they do get thick and they get painful in the corners     Assessment:  Chronic ingrown toenail deformity second right chronic mycotic nail infection with discomfort 1-5 both feet     Plan:  Patient be reviewed both conditions recommended correction of digital nail that is digging into the skin and causing pain and I infiltrated the right second toe 60 mg like Marcaine mixture sterile prep done using sterile instrumentation remove the nail border exposed matrix applied phenol 3 applications 30 seconds followed by alcohol lavage sterile dressing gave instructions on soaks and then I went ahead and I debrided nailbeds 1-5 both feet no angiogenic bleeding reappoint routine care

## 2022-06-20 ENCOUNTER — Telehealth: Payer: Self-pay | Admitting: Podiatry

## 2022-06-20 MED ORDER — MELOXICAM 7.5 MG PO TABS: 7.5000 mg | ORAL_TABLET | Freq: Every day | ORAL | 0 refills | Status: DC

## 2022-06-20 NOTE — Telephone Encounter (Signed)
Pt called and had ingrown removed by Dr Charlsie Merles yesterday and stating he is in a lot of throbbing pain. He was not able to sleep last night from the pain. Could you call something in for him to take for the pain. Pharmacy is correct in the chart.

## 2022-06-20 NOTE — Telephone Encounter (Signed)
Notified pt that a medication was sent in and he stated he just took his first pill and hoping this helps. He said thank you so much for what you did.

## 2022-07-01 DIAGNOSIS — Z8546 Personal history of malignant neoplasm of prostate: Secondary | ICD-10-CM | POA: Diagnosis not present

## 2022-07-08 DIAGNOSIS — R351 Nocturia: Secondary | ICD-10-CM | POA: Diagnosis not present

## 2022-07-08 DIAGNOSIS — N5231 Erectile dysfunction following radical prostatectomy: Secondary | ICD-10-CM | POA: Diagnosis not present

## 2022-07-08 DIAGNOSIS — Z8546 Personal history of malignant neoplasm of prostate: Secondary | ICD-10-CM | POA: Diagnosis not present

## 2022-07-17 ENCOUNTER — Other Ambulatory Visit: Payer: Self-pay | Admitting: Cardiology

## 2022-08-17 ENCOUNTER — Other Ambulatory Visit: Payer: Self-pay | Admitting: Cardiology

## 2022-08-26 DIAGNOSIS — Z6838 Body mass index (BMI) 38.0-38.9, adult: Secondary | ICD-10-CM | POA: Diagnosis not present

## 2022-08-26 DIAGNOSIS — S6991XA Unspecified injury of right wrist, hand and finger(s), initial encounter: Secondary | ICD-10-CM | POA: Diagnosis not present

## 2022-08-26 DIAGNOSIS — M79641 Pain in right hand: Secondary | ICD-10-CM | POA: Diagnosis not present

## 2022-09-11 DIAGNOSIS — Z6838 Body mass index (BMI) 38.0-38.9, adult: Secondary | ICD-10-CM | POA: Diagnosis not present

## 2022-09-11 DIAGNOSIS — M79641 Pain in right hand: Secondary | ICD-10-CM | POA: Diagnosis not present

## 2022-09-11 DIAGNOSIS — T1592XA Foreign body on external eye, part unspecified, left eye, initial encounter: Secondary | ICD-10-CM | POA: Diagnosis not present

## 2022-09-18 ENCOUNTER — Encounter: Payer: Self-pay | Admitting: Podiatry

## 2022-09-18 ENCOUNTER — Ambulatory Visit: Payer: PPO | Admitting: Podiatry

## 2022-09-18 DIAGNOSIS — M79674 Pain in right toe(s): Secondary | ICD-10-CM

## 2022-09-18 DIAGNOSIS — M79675 Pain in left toe(s): Secondary | ICD-10-CM

## 2022-09-18 DIAGNOSIS — B351 Tinea unguium: Secondary | ICD-10-CM | POA: Diagnosis not present

## 2022-09-18 NOTE — Progress Notes (Signed)
This patient returns to my office for at risk foot care.  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  This patient is unable to cut nails himself since the patient cannot reach his nails.These nails are painful walking and wearing shoes.  This patient presents for at risk foot care today.  General Appearance  Alert, conversant and in no acute stress.  Vascular  Dorsalis pedis and posterior tibial  pulses are palpable  bilaterally.  Capillary return is within normal limits  bilaterally. Temperature is within normal limits  bilaterally.  Neurologic  Senn-Weinstein monofilament wire test within normal limits  bilaterally. Muscle power within normal limits bilaterally.  Nails Thick disfigured discolored nails with subungual debris  from hallux to fifth toes bilaterally. No evidence of bacterial infection or drainage bilaterally.  Orthopedic  No limitations of motion  feet .  No crepitus or effusions noted.  No bony pathology or digital deformities noted.  Skin  normotropic skin with no porokeratosis noted bilaterally.  No signs of infections or ulcers noted.     Onychomycosis  Pain in right toes  Pain in left toes  Consent was obtained for treatment procedures.   Mechanical debridement of nails 1-5  bilaterally performed with a nail nipper.  Filed with dremel without incident.    Return office visit    3 months                  Told patient to return for periodic foot care and evaluation due to potential at risk complications.   Helane Gunther DPM

## 2022-09-26 DIAGNOSIS — Z96652 Presence of left artificial knee joint: Secondary | ICD-10-CM | POA: Diagnosis not present

## 2022-09-26 DIAGNOSIS — Z471 Aftercare following joint replacement surgery: Secondary | ICD-10-CM | POA: Diagnosis not present

## 2022-10-26 ENCOUNTER — Other Ambulatory Visit: Payer: Self-pay | Admitting: Cardiology

## 2022-11-14 DIAGNOSIS — K5900 Constipation, unspecified: Secondary | ICD-10-CM | POA: Diagnosis not present

## 2022-11-14 DIAGNOSIS — R1013 Epigastric pain: Secondary | ICD-10-CM | POA: Diagnosis not present

## 2022-11-14 DIAGNOSIS — K219 Gastro-esophageal reflux disease without esophagitis: Secondary | ICD-10-CM | POA: Diagnosis not present

## 2022-11-14 DIAGNOSIS — Z860101 Personal history of adenomatous and serrated colon polyps: Secondary | ICD-10-CM | POA: Diagnosis not present

## 2022-11-14 DIAGNOSIS — R1310 Dysphagia, unspecified: Secondary | ICD-10-CM | POA: Diagnosis not present

## 2022-11-14 DIAGNOSIS — R142 Eructation: Secondary | ICD-10-CM | POA: Diagnosis not present

## 2022-11-18 DIAGNOSIS — Z23 Encounter for immunization: Secondary | ICD-10-CM | POA: Diagnosis not present

## 2022-11-21 DIAGNOSIS — R058 Other specified cough: Secondary | ICD-10-CM | POA: Diagnosis not present

## 2022-11-21 DIAGNOSIS — Z6839 Body mass index (BMI) 39.0-39.9, adult: Secondary | ICD-10-CM | POA: Diagnosis not present

## 2022-11-24 ENCOUNTER — Other Ambulatory Visit: Payer: Self-pay | Admitting: Cardiology

## 2022-11-29 ENCOUNTER — Encounter: Payer: Self-pay | Admitting: Podiatry

## 2022-11-29 ENCOUNTER — Ambulatory Visit: Payer: PPO | Admitting: Podiatry

## 2022-11-29 DIAGNOSIS — M79675 Pain in left toe(s): Secondary | ICD-10-CM

## 2022-11-29 DIAGNOSIS — B351 Tinea unguium: Secondary | ICD-10-CM

## 2022-11-29 DIAGNOSIS — M79674 Pain in right toe(s): Secondary | ICD-10-CM | POA: Diagnosis not present

## 2022-11-29 NOTE — Progress Notes (Signed)
This patient returns to my office for at risk foot care.  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  This patient is unable to cut nails himself since the patient cannot reach his nails.These nails are painful walking and wearing shoes.  This patient presents for at risk foot care today.  General Appearance  Alert, conversant and in no acute stress.  Vascular  Dorsalis pedis and posterior tibial  pulses are palpable  bilaterally.  Capillary return is within normal limits  bilaterally. Temperature is within normal limits  bilaterally.  Neurologic  Senn-Weinstein monofilament wire test within normal limits  bilaterally. Muscle power within normal limits bilaterally.  Nails Thick disfigured discolored nails with subungual debris  from hallux to fifth toes bilaterally. No evidence of bacterial infection or drainage bilaterally.  Orthopedic  No limitations of motion  feet .  No crepitus or effusions noted.  No bony pathology or digital deformities noted.  Skin  normotropic skin with no porokeratosis noted bilaterally.  No signs of infections or ulcers noted.     Onychomycosis  Pain in right toes  Pain in left toes  Consent was obtained for treatment procedures.   Mechanical debridement of nails 1-5  bilaterally performed with a nail nipper.  Filed with dremel without incident.    Return office visit    3 months                  Told patient to return for periodic foot care and evaluation due to potential at risk complications.   Helane Gunther DPM

## 2022-12-11 DIAGNOSIS — D120 Benign neoplasm of cecum: Secondary | ICD-10-CM | POA: Diagnosis not present

## 2022-12-11 DIAGNOSIS — K219 Gastro-esophageal reflux disease without esophagitis: Secondary | ICD-10-CM | POA: Diagnosis not present

## 2022-12-11 DIAGNOSIS — Z09 Encounter for follow-up examination after completed treatment for conditions other than malignant neoplasm: Secondary | ICD-10-CM | POA: Diagnosis not present

## 2022-12-11 DIAGNOSIS — R1013 Epigastric pain: Secondary | ICD-10-CM | POA: Diagnosis not present

## 2022-12-11 DIAGNOSIS — K2930 Chronic superficial gastritis without bleeding: Secondary | ICD-10-CM | POA: Diagnosis not present

## 2022-12-11 DIAGNOSIS — B9681 Helicobacter pylori [H. pylori] as the cause of diseases classified elsewhere: Secondary | ICD-10-CM | POA: Diagnosis not present

## 2022-12-11 DIAGNOSIS — Z860101 Personal history of adenomatous and serrated colon polyps: Secondary | ICD-10-CM | POA: Diagnosis not present

## 2022-12-11 DIAGNOSIS — R1033 Periumbilical pain: Secondary | ICD-10-CM | POA: Diagnosis not present

## 2022-12-11 DIAGNOSIS — D122 Benign neoplasm of ascending colon: Secondary | ICD-10-CM | POA: Diagnosis not present

## 2022-12-12 DIAGNOSIS — L649 Androgenic alopecia, unspecified: Secondary | ICD-10-CM | POA: Diagnosis not present

## 2022-12-12 DIAGNOSIS — L738 Other specified follicular disorders: Secondary | ICD-10-CM | POA: Diagnosis not present

## 2022-12-18 ENCOUNTER — Ambulatory Visit: Payer: PPO | Admitting: Podiatry

## 2022-12-18 DIAGNOSIS — B9681 Helicobacter pylori [H. pylori] as the cause of diseases classified elsewhere: Secondary | ICD-10-CM | POA: Diagnosis not present

## 2022-12-18 DIAGNOSIS — D122 Benign neoplasm of ascending colon: Secondary | ICD-10-CM | POA: Diagnosis not present

## 2022-12-18 DIAGNOSIS — K2930 Chronic superficial gastritis without bleeding: Secondary | ICD-10-CM | POA: Diagnosis not present

## 2022-12-18 DIAGNOSIS — D120 Benign neoplasm of cecum: Secondary | ICD-10-CM | POA: Diagnosis not present

## 2023-01-04 ENCOUNTER — Other Ambulatory Visit: Payer: Self-pay | Admitting: Nurse Practitioner

## 2023-01-09 IMAGING — CT CT ABD-PELV W/ CM
1 of 3 series · 12 of 32 positions shown, 18 images · IV contrast (APPLIED)
Comparison: CT abdomen pelvis dated 08/10/2018.

CLINICAL DATA: Left-sided pain. Evaluate for hernia. History of
prostate cancer.

EXAM:
CT ABDOMEN AND PELVIS WITH CONTRAST
TECHNIQUE: Multidetector CT imaging of the abdomen and pelvis was performed
using the standard protocol following bolus administration of
intravenous contrast.

[Series 2: abd/pelvis w/cm · axial · 0.98mm/px · z∈[-527,-122]mm · 12 of 95 slices shown, 18 images]
[im 7/95  soft-tissue]
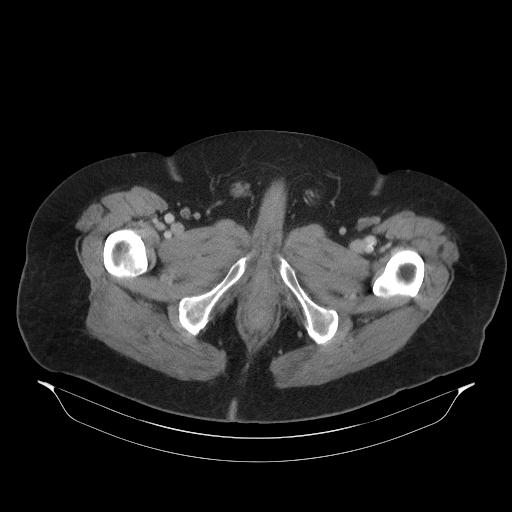
[im 7/95  bone]
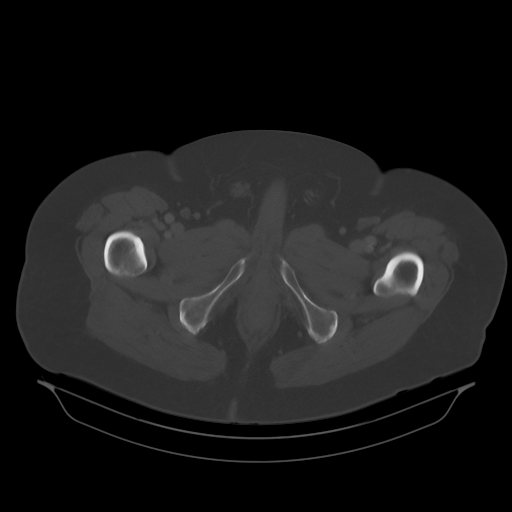
[im 14/95  soft-tissue]
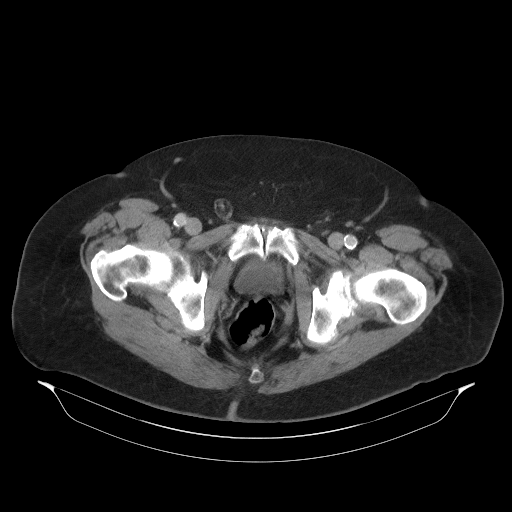
[im 21/95  soft-tissue]
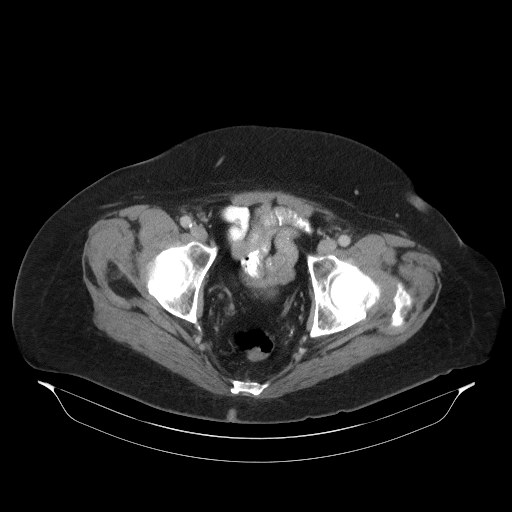
[im 27/95  soft-tissue]
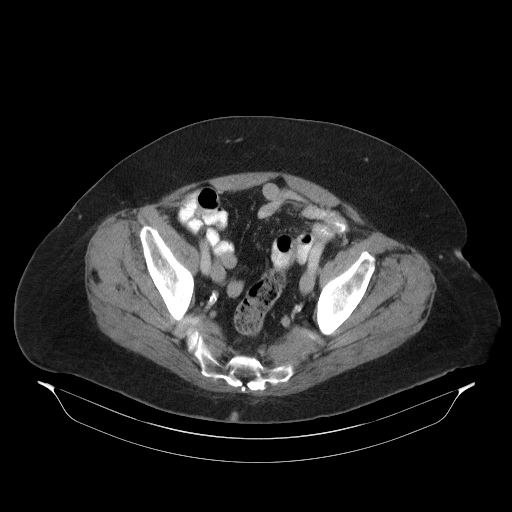
[im 34/95  soft-tissue]
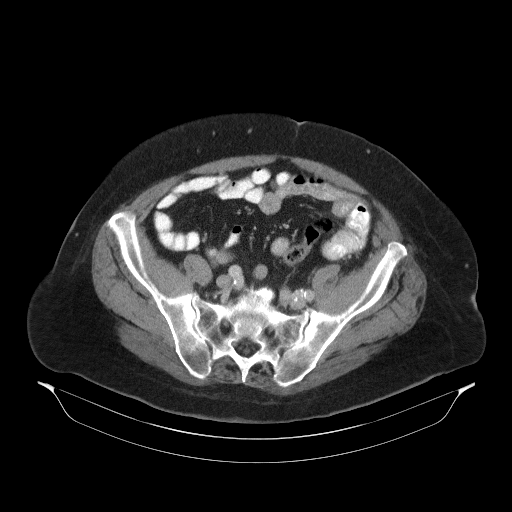
[im 41/95  soft-tissue]
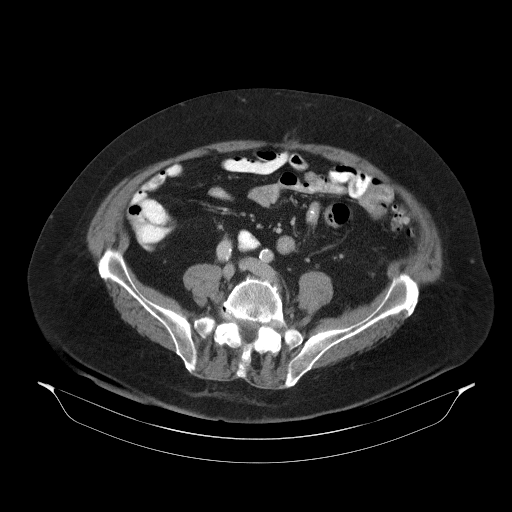
[im 54/95  soft-tissue]
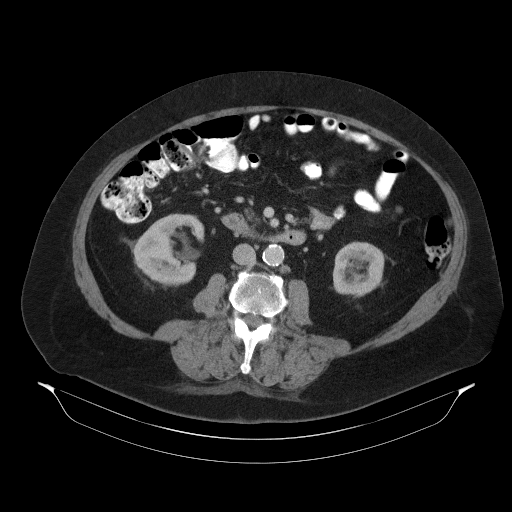
[im 61/95  soft-tissue]
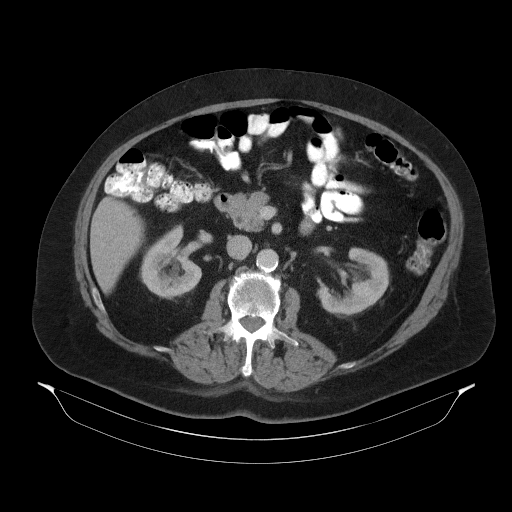
[im 68/95  soft-tissue]
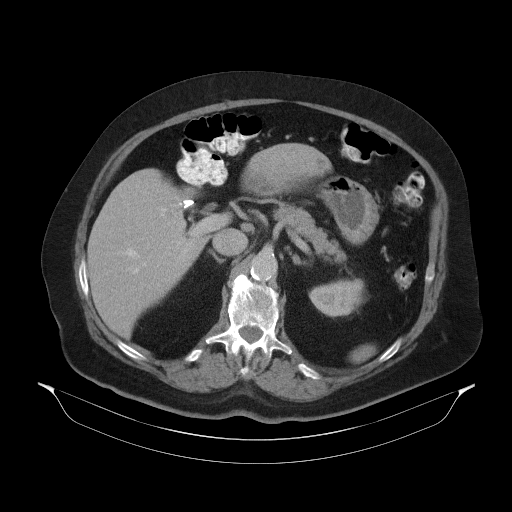
[im 68/95  lung]
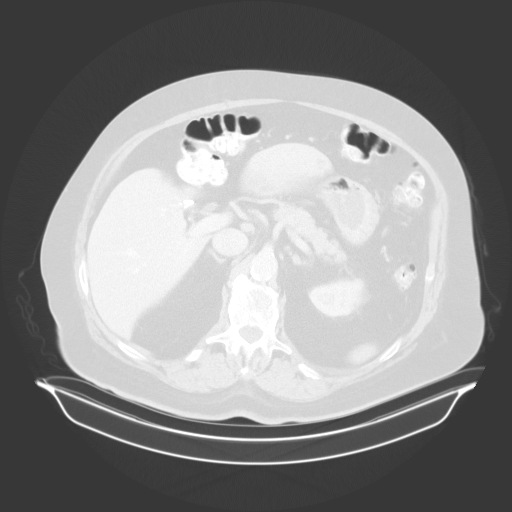
[im 68/95  bone]
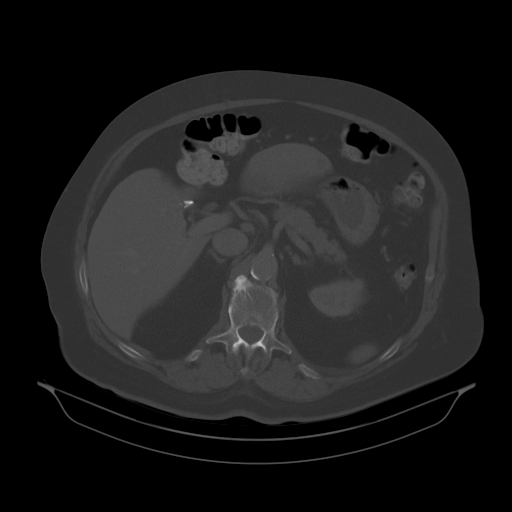
[im 74/95  soft-tissue]
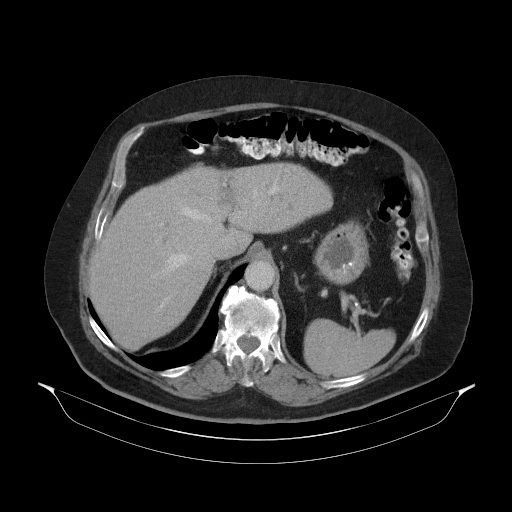
[im 74/95  lung]
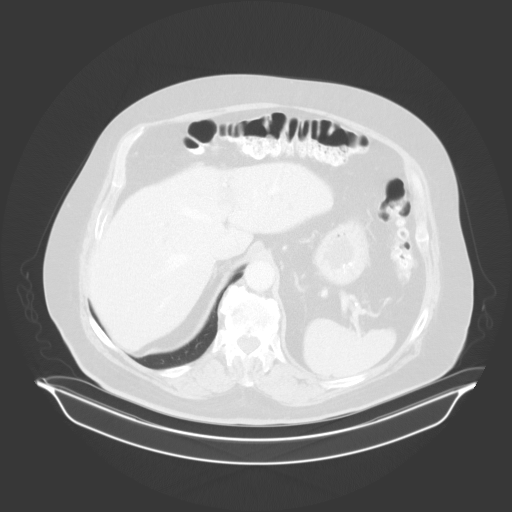
[im 81/95  soft-tissue]
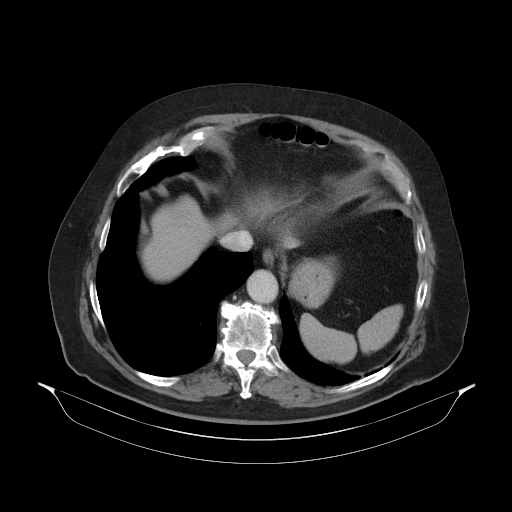
[im 81/95  lung]
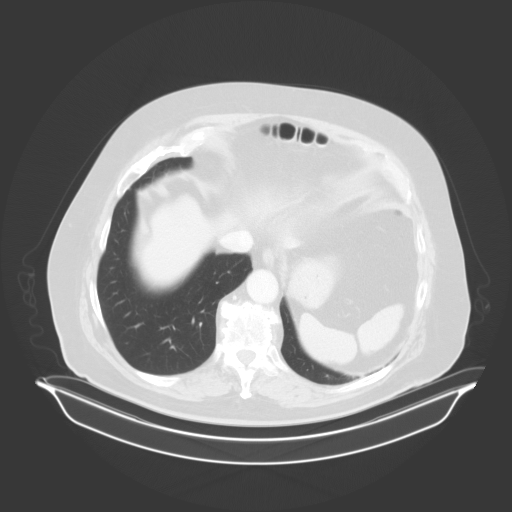
[im 88/95  soft-tissue]
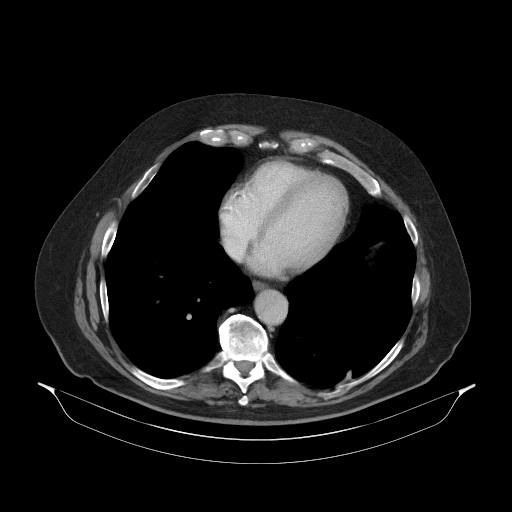
[im 88/95  lung]
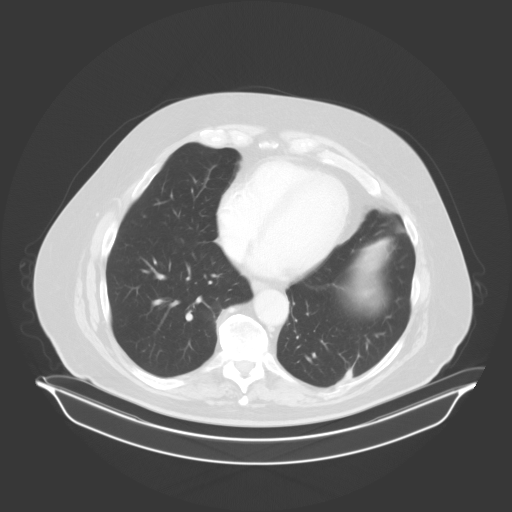

[12 of 32 positions shown; findings below may reference images not displayed]

RADIATION DOSE REDUCTION: This exam was performed according to the
departmental dose-optimization program which includes automated
exposure control, adjustment of the mA and/or kV according to
patient size and/or use of iterative reconstruction technique.

CONTRAST:  100mL 3WU39L-MGG IOPAMIDOL (3WU39L-MGG) INJECTION 61%
FINDINGS: Lower chest: Linear scarring at the left lung base. The visualized
lung bases are otherwise clear. There is coronary vascular
calcification.

No intra-abdominal free air or free fluid.

Hepatobiliary: Multiple liver cysts measure up to 3 cm in the left
lobe of the liver. Additional subcentimeter hepatic hypodense
lesions are too small to characterize but were seen on the prior CT.
No intrahepatic biliary dilatation. Cholecystectomy. No retained
calcified stone noted in the central CBD.

Pancreas: Unremarkable. No pancreatic ductal dilatation or
surrounding inflammatory changes.

Spleen: Normal in size without focal abnormality.

Adrenals/Urinary Tract: The adrenal glands are unremarkable. Small
bilateral renal parapelvic cysts. There is no hydronephrosis on
either side. There is symmetric enhancement and excretion of
contrast by both kidneys. The visualized ureters and urinary bladder
appear unremarkable.

Stomach/Bowel: There is sigmoid and distal colonic diverticulosis
without active inflammatory changes. There is no bowel obstruction
or active inflammation. The appendix is not identified and may be
surgically absent.

Vascular/Lymphatic: Moderate aortoiliac atherosclerotic disease. The
IVC is unremarkable. No portal venous gas. There is no adenopathy.

Reproductive: Prostatectomy.

Other: None

Musculoskeletal: Degenerative changes of the spine. No acute osseous
pathology.
IMPRESSION: 1. No acute intra-abdominal or pelvic pathology.  No hernia.
2. Colonic diverticulosis. No bowel obstruction.
3. Aortic Atherosclerosis ([id number]).

## 2023-01-15 ENCOUNTER — Telehealth: Payer: Self-pay | Admitting: Cardiology

## 2023-01-15 NOTE — Telephone Encounter (Signed)
 Pt c/o swelling: STAT is pt has developed SOB within 24 hours  How much weight have you gained and in what time span?  No irregular weight gain. Patient states his weight normally fluctuates with the holiday(s).  If swelling, where is the swelling located?  Left leg only   Are you currently taking a fluid pill?  No   Are you currently SOB?  No   Do you have a log of your daily weights (if so, list)?  No   Have you gained 3 pounds in a day or 5 pounds in a week?  Have you traveled recently?  No, but patient elevates his legs in a recliner during the day. He mentions that he is unable to wear compressions because they cause his knees to swell. Patient mentions that his left leg has been operated on.  Patient mentions lab work. He states he normally has labs a week prior to his routine follow up. He would like to have orders placed prior to 3/27 appointment.

## 2023-01-15 NOTE — Telephone Encounter (Signed)
 Called and spoke to patient. Verified name and DOB. Patient report swelling in both ankles but worse in the left where he had knee surgery. He deny redness, warmth, CP, SOB or any other symptoms at this time. He has not had increased weight gain and has not been monitoring his BP. He states this has been going on for about 6 months. The swelling is mostly during the day but goes away at night while in bed. He does elevate his feet during the day and wear compression hose. Patient is scheduled with Callie Goodrich, PA for 1/9 at 8:50 am.

## 2023-01-15 NOTE — Progress Notes (Signed)
 Cardiology Office Note:    Date:  01/16/2023   ID:  Jeffery [name], DOB March 15, 1950, MRN [id number]  PCP:  Jeffery Carlin Redbird, MD  Cardiologist:  Jeffery Shallow, MD     Referring MD: Jeffery Carlin Redbird, MD   Chief Complaint: lower extremity edema  History of Present Illness:    Jeffery [name] is a 73 y.o. male with a history of CAD with NSTEMI in 2009 s/p DES to LAD and RCA, ischemic cardiomyopathy with EF of 47% on Myoview  in 02/2021, hypertension, hyperlipidemia, GERD, and degenerative disc disease who is followed by Dr. Shallow and presents today for evaluation of lower extremity edema.   Patient was admitted for NSTEMI in 2009 and underwent PCI with DES to LAD and RCA at that time. EF at that time was 40%. Repeat cardiac catheterization in 2017 showed patent stents. 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 Most recent Myoview  in 02/2021 was intermediate risk with findings consistent with prior MI but no ischemia. EF was 47%.   He was last seen by Damien Braver, NP, in 11/2021 at which time he was doing well from a cardiac standpoint.He was in need of a left knee replacement and was felt to be at acceptable risk for this.   Patient called our office on 01/15/2023 and reported lower extremity edema. Therefore, this visit was arranged for further evaluation. He reports edema primarily in his left ankle for the past 6 months. It typically improves with elevation of legs overnight.  He had a left knee replacement in 02/2022 and was told he may have swelling in that leg for a while but he is concerned about this. He denies any leg pain. He denies any recent surgery (other than knee surgery in 02/2022) or prolonged travel. Otherwise, he is doing well. He denies any chest pain, shortness of breath, orthopnea, PND, or palpitations. He notes occasional very brief lightheadedness with quick position changes but this resolves quickly. No falls or syncope. He has had some weight gain  recently. He thinks he has gained 15 lbs since 11/2022 but attributes this to eating a lot more. He also states it is not uncommon for his weight to fluctuate up and down 2 lbs every day based on what he eats. He is not following a low sodium diet.   EKGs/Labs/Other Studies Reviewed:    The following studies were reviewed:  Left Cardiac Catheterization 03/06/2015: Prox LAD to Mid LAD lesion, 10% stenosed. The lesion was previously treated with a drug-eluting stent greater than two years ago. Dist LAD lesion, 20% stenosed. 1st Mrg lesion, 30% stenosed. Prox RCA lesion, 30% stenosed.   1. Double vessel CAD with patent stent proximal LAD and patent stent distal RCA   Recommendations: Continue medical management for stable CAD.  _______________  Myoview  02/08/2021:   Findings are consistent with prior myocardial infarction and no prior ischemia. The study is intermediate risk.   No ST deviation was noted.   LV perfusion is abnormal. Defect 1: There is a medium defect with mild reduction in uptake present in the apical to mid inferior and inferolateral location(s) that is fixed. There is abnormal wall motion in the defect area. Consistent with infarction.   Left ventricular function is abnormal. Global function is mildly reduced. There was a single regional abnormality. Nuclear stress EF: 47 %. The left ventricular ejection fraction is mildly decreased (45-54%). End diastolic cavity size is normal. End systolic cavity size is normal.  Prior study available for comparison from 01/11/2019.  No significant changes.   EKG:  EKG ordered today.   EKG Interpretation Date/Time:  Thursday January 16 2023 08:56:26 EST Ventricular Rate:  56 PR Interval:  180 QRS Duration:  140 QT Interval:  430 QTC Calculation: 414 R Axis:   -32  Text Interpretation: Sinus bradycardia with sinus arrhythmia Left axis deviation Right bundle branch block Minimal voltage criteria for LVH, may be normal variant ( R in aVL  )  No significant changes compared to prior tracings Confirmed by Osha Rane [phone]) on 01/16/2023 9:03:48 AM   Recent Labs: No results found for requested labs within last 365 days.  Recent Lipid Panel    Component Value Date/Time   CHOL 139 02/08/2021 0819   TRIG 75 02/08/2021 0819   HDL 60 02/08/2021 0819   CHOLHDL 2.3 02/08/2021 0819   CHOLHDL 2.1 12/05/2014 0851   VLDL 8 12/05/2014 0851   LDLCALC 64 02/08/2021 0819    Physical Exam:    Vital Signs: BP 128/71   Pulse (!) 56   Ht 5' 9.5 (1.765 m)   Wt 267 lb (121.1 kg)   SpO2 97%   BMI 38.86 kg/m     Wt Readings from Last 3 Encounters:  01/16/23 267 lb (121.1 kg)  12/05/21 255 lb 8 oz (115.9 kg)  02/08/21 254 lb (115.2 kg)     General: 73 y.o. obese Caucasian male in no acute distress. HEENT: Normocephalic and atraumatic. Sclera clear.  Neck: Supple. No carotid bruits. No JVD. Heart: Mildly bradycardic with normal rhythm.  No murmurs, gallops, or rubs.  Lungs: No increased work of breathing. Clear to ausculation bilaterally. No wheezes, rhonchi, or rales.  Extremities: 1+ edema of left ankle with otherwise only trace edema of bilateral lower extremities. Varicose veins (non-tender) noted on right leg. Skin: Warm and dry. Neuro: No focal deficits. Psych: Normal affect. Responds appropriately.  Assessment:    1. Lower extremity edema   2. Ischemic cardiomyopathy   3. Coronary artery disease involving native coronary artery of native heart without angina pectoris   4. Essential hypertension   5. Hyperlipidemia, unspecified hyperlipidemia type     Plan:    Lower Extremity Edema Ischemic Cardiomyopathy Patient has a history of ischemic cardiomyopathy. EF was 40% at time of NSTEMI in 2009. Myoview  in 02/2021 showed EF of 47%. He presents today for further evaluation of edema primarily in his left ankle over the last 6 months.  - He does have some lower extremity edema, primarily in his left ankle, but  otherwise looks euvolemic on exam. He does report recent weight gain but suspect this is true weight gain as he states he has been eating more.  - Will order Echo and check BNP and CMET.  - Will likely start Lasix 40mg  for 2 days and then only as needed for weight gain or worsening edema after that. However, will wait for labs from today before prescribing this. - Continue Losartan  100mg  daily.  - Continue Coreg  12.5mg  twice daily.  - May need to adjust GDMT depending on what EF is on repeat Echo. Low threshold to start a SGLT2 inhibitor.  - Discussed important of daily weights or sodium restrictions.   CAD  History of NSTEMI in 2009 s/p DES to LAD and RCA.  - No chest pain.  - Continue aspirin  and statin.   Hypertension BP well controlled.  - Current medications: Amlodipine  10mg  daily, Coreg  12.5mg  twice daily, and Losartan  100mg   daily.   Hyperlipidemia Lipid panel in 02/2021: Total Cholesterol 139, Triglycerides 75, HDL 60, LDL 64.  - Continue Lipitor  80mg  daily.  - He is overdue for labs but is not fasting today. He will come back for a fasting lipid panel.  Disposition: Follow up in 6 months.    Signed, Aline FORBES Door, PA-C  01/16/2023 12:51 PM    [facility]

## 2023-01-16 ENCOUNTER — Encounter: Payer: Self-pay | Admitting: Student

## 2023-01-16 ENCOUNTER — Ambulatory Visit: Payer: Medicare HMO | Attending: Student | Admitting: Student

## 2023-01-16 VITALS — BP 128/71 | HR 56 | Ht 69.5 in | Wt 267.0 lb

## 2023-01-16 DIAGNOSIS — I10 Essential (primary) hypertension: Secondary | ICD-10-CM | POA: Diagnosis not present

## 2023-01-16 DIAGNOSIS — I255 Ischemic cardiomyopathy: Secondary | ICD-10-CM | POA: Diagnosis not present

## 2023-01-16 DIAGNOSIS — E785 Hyperlipidemia, unspecified: Secondary | ICD-10-CM

## 2023-01-16 DIAGNOSIS — R600 Localized edema: Secondary | ICD-10-CM | POA: Diagnosis not present

## 2023-01-16 DIAGNOSIS — I2510 Atherosclerotic heart disease of native coronary artery without angina pectoris: Secondary | ICD-10-CM | POA: Diagnosis not present

## 2023-01-16 NOTE — Patient Instructions (Signed)
 Medication Instructions:  No changes *If you need a refill on your cardiac medications before your next appointment, please call your pharmacy*  Heart Failure Education: Weigh yourself EVERY morning after you go to the bathroom but before you eat or drink anything. Write this number down in a weight log/diary. If you gain 3 pounds overnight or 5 pounds in a week, call the office. Take your medicines as prescribed. If you have concerns about your medications, please call us  before you stop taking them.  Eat low salt foods--Limit salt (sodium) to 2000 mg per day. This will help prevent your body from holding onto fluid. Read food labels as many processed foods have a lot of sodium, especially canned goods and prepackaged meats. If you would like some assistance choosing low sodium foods, we would be happy to set you up with a nutritionist. Limit all fluids for the day to less than 2 liters (64 ounces). Fluid includes all drinks, coffee, juice, ice chips, soup, jello, and all other liquids. Stay as active as you can everyday. Staying active will give you more energy and make your muscles stronger. Start with 5 minutes at a time and work your way up to 30 minutes a day. Break up your activities--do some in the morning and some in the afternoon. Start with 3 days per week and work your way up to 5 days as you can.  If you have chest pain, feel short of breath, dizzy, or lightheaded, STOP. If you don't feel better after a short rest, call 911. If you do feel better, call the office to let us  know you have symptoms with exercise.   Lab Work: BNP, CMP- TODAY LIPID PANEL- Please return for FASTING  Blood Work in WITHIN THE NEXT MONTH. No appointment needed, lab here at the office is open Monday-Friday from 8AM to 4PM and closed daily for lunch from 12:45-1:45.   If you have labs (blood work) drawn today and your tests are completely normal, you will receive your results only by: MyChart Message (if you have  MyChart) OR A paper copy in the mail If you have any lab test that is abnormal or we need to change your treatment, we will call you to review the results.   Testing/Procedures: Your physician has requested that you have an echocardiogram. Echocardiography is a painless test that uses sound waves to create images of your heart. It provides your doctor with information about the size and shape of your heart and how well your heart's chambers and valves are working. This procedure takes approximately one hour. There are no restrictions for this procedure. Please do NOT wear cologne, perfume, aftershave, or lotions (deodorant is allowed). Please arrive 15 minutes prior to your appointment time.  Please note: We ask at that you not bring children with you during ultrasound (echo/ vascular) testing. Due to room size and safety concerns, children are not allowed in the ultrasound rooms during exams. Our front office staff cannot provide observation of children in our lobby area while testing is being conducted. An adult accompanying a patient to their appointment will only be allowed in the ultrasound room at the discretion of the ultrasound technician under special circumstances. We apologize for any inconvenience.    Follow-Up: At [facility], you and your health needs are our priority.  As part of our continuing mission to provide you with exceptional heart care, we have created designated Provider Care Teams.  These Care Teams include your primary Cardiologist (  physician) and Advanced Practice Providers (APPs -  Physician Assistants and Nurse Practitioners) who all work together to provide you with the care you need, when you need it.  We recommend signing up for the patient portal called MyChart.  Sign up information is provided on this After Visit Summary.  MyChart is used to connect with patients for Virtual Visits (Telemedicine).  Patients are able to view lab/test results, encounter  notes, upcoming appointments, etc.  Non-urgent messages can be sent to your provider as well.   To learn more about what you can do with MyChart, go to [email or web address].    Your next appointment:   Already scheduled appt with Dr Pietro 04/03/23 at 08:20

## 2023-02-04 ENCOUNTER — Other Ambulatory Visit: Payer: Self-pay | Admitting: Nurse Practitioner

## 2023-02-06 ENCOUNTER — Encounter: Payer: Self-pay | Admitting: Podiatry

## 2023-02-06 ENCOUNTER — Ambulatory Visit (INDEPENDENT_AMBULATORY_CARE_PROVIDER_SITE_OTHER): Payer: Medicare HMO | Admitting: Podiatry

## 2023-02-06 DIAGNOSIS — B351 Tinea unguium: Secondary | ICD-10-CM | POA: Diagnosis not present

## 2023-02-06 DIAGNOSIS — M79675 Pain in left toe(s): Secondary | ICD-10-CM | POA: Diagnosis not present

## 2023-02-06 DIAGNOSIS — M79674 Pain in right toe(s): Secondary | ICD-10-CM | POA: Diagnosis not present

## 2023-02-07 ENCOUNTER — Encounter: Payer: Self-pay | Admitting: Podiatry

## 2023-02-07 NOTE — Progress Notes (Signed)
This patient returns to my office for at risk foot care.  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  This patient is unable to cut nails himself since the patient cannot reach his nails.These nails are painful walking and wearing shoes.  This patient presents for at risk foot care today.  General Appearance  Alert, conversant and in no acute stress.  Vascular  Dorsalis pedis and posterior tibial  pulses are palpable  bilaterally.  Capillary return is within normal limits  bilaterally. Temperature is within normal limits  bilaterally.  Neurologic  Senn-Weinstein monofilament wire test within normal limits  bilaterally. Muscle power within normal limits bilaterally.  Nails Thick disfigured discolored nails with subungual debris  from hallux to fifth toes bilaterally. No evidence of bacterial infection or drainage bilaterally.  Orthopedic  No limitations of motion  feet .  No crepitus or effusions noted.  No bony pathology or digital deformities noted.  Skin  normotropic skin with no porokeratosis noted bilaterally.  No signs of infections or ulcers noted.     Onychomycosis  Pain in right toes  Pain in left toes  Consent was obtained for treatment procedures.   Mechanical debridement of nails 1-5  bilaterally performed with a nail nipper.  Filed with dremel without incident.    Return office visit    9 weeks               Told patient to return for periodic foot care and evaluation due to potential at risk complications.   Helane Gunther DPM

## 2023-02-17 ENCOUNTER — Ambulatory Visit (HOSPITAL_COMMUNITY)
Admission: RE | Admit: 2023-02-17 | Discharge: 2023-02-17 | Disposition: A | Discharge status: Home / Self Care | Payer: Medicare HMO | Source: Ambulatory Visit | Attending: Student | Admitting: Student

## 2023-02-17 DIAGNOSIS — I255 Ischemic cardiomyopathy: Secondary | ICD-10-CM

## 2023-02-17 DIAGNOSIS — R600 Localized edema: Secondary | ICD-10-CM

## 2023-02-17 DIAGNOSIS — E785 Hyperlipidemia, unspecified: Secondary | ICD-10-CM | POA: Diagnosis not present

## 2023-02-17 LAB — ECHOCARDIOGRAM COMPLETE
AR max vel: 2.37 cm2
AV Area VTI: 2.62 cm2
AV Area mean vel: 2.34 cm2
AV Mean grad: 3 mm[Hg]
AV Peak grad: 6 mm[Hg]
Ao pk vel: 1.22 m/s
Area-P 1/2: 3.12 cm2
S' Lateral: 3.23 cm

## 2023-02-17 LAB — LIPID PANEL
Chol/HDL Ratio: 2.2 {ratio} (ref 0.0–5.0)
Cholesterol, Total: 133 mg/dL (ref 100–199)
HDL: 61 mg/dL (ref >39)
LDL Chol Calc (NIH): 59 mg/dL (ref 0–99)
Triglycerides: 65 mg/dL (ref 0–149)
VLDL Cholesterol Cal: 13 mg/dL (ref 5–40)

## 2023-02-19 ENCOUNTER — Telehealth: Payer: Self-pay | Admitting: Cardiology

## 2023-02-19 ENCOUNTER — Other Ambulatory Visit (HOSPITAL_COMMUNITY): Payer: Self-pay

## 2023-02-19 MED ORDER — DAPAGLIFLOZIN PROPANEDIOL 10 MG PO TABS: 10.0000 mg | ORAL_TABLET | Freq: Every day | ORAL | 6 refills | Status: DC

## 2023-02-19 NOTE — Telephone Encounter (Signed)
Pt c/o medication issue:  1. Name of Medication: dapagliflozin propanediol (FARXIGA) 10 MG TABS tablet   2. How are you currently taking this medication (dosage and times per day)? As written  3. Are you having a reaction (difficulty breathing--STAT)? No   4. What is your medication issue? Pt wants to know where is the cheapest place to get this medication

## 2023-02-20 ENCOUNTER — Other Ambulatory Visit (HOSPITAL_COMMUNITY): Payer: Self-pay

## 2023-02-20 ENCOUNTER — Telehealth: Payer: Self-pay | Admitting: Pharmacy Technician

## 2023-02-20 NOTE — Telephone Encounter (Signed)
Perfect.  Thank you.Jeffery KitchenMarland KitchenHarriett Sine

## 2023-02-20 NOTE — Telephone Encounter (Signed)
Called pt to relay message. Pt is interested in patient assistance

## 2023-02-20 NOTE — Telephone Encounter (Signed)
Pharmacy Patient Advocate Encounter   Received notification from Fax that prior authorization for Dapagliflozin 10mg  is required/requested.   Insurance verification completed.   The patient is insured through [employer] .   Per test claim: the generic needs a prior authorization but the brand does not need a prior authorization.  Last filled 02/19/23, next fill 03/14/23

## 2023-02-21 ENCOUNTER — Telehealth: Payer: Self-pay | Admitting: Pharmacy Technician

## 2023-02-21 NOTE — Telephone Encounter (Signed)
PAP: Patient assistance application for Farxiga through [employer] (AZ[employer]) has been mailed to pt's home address on file. Provider portion of application will be faxed to provider's office.

## 2023-03-03 ENCOUNTER — Ambulatory Visit: Payer: Medicare HMO | Admitting: Podiatry

## 2023-03-03 NOTE — Telephone Encounter (Signed)
 I called the patient and he said that he put the wrong AIG on the form. He will come into the office today to give them the correct income info. Then I will send that in once we receive his forms he mailed earlier last week.

## 2023-03-03 NOTE — Telephone Encounter (Signed)
 Scan of patient's income in media now. Waiting on mailed forms

## 2023-03-03 NOTE — Telephone Encounter (Signed)
 Patient stated he mailed his application back but made an error on his form.  Patient wants a call back to discuss how he can correct the error.

## 2023-03-05 NOTE — Telephone Encounter (Signed)
 The scan of the provider portion for AZ&ME is scanned in media under farxiga patient assistance doctor form. Can someone get the provider to sign that form and scan it back in so I can send to the patient assistance, please?

## 2023-03-06 ENCOUNTER — Other Ambulatory Visit: Payer: Self-pay | Admitting: Nurse Practitioner

## 2023-03-12 NOTE — Telephone Encounter (Signed)
 Yes I faxed it yesterday but have not had a chance to look for it in onbase to route to you, sorry

## 2023-03-12 NOTE — Telephone Encounter (Signed)
 PAP: Application for Marcelline Deist has been submitted to [employer] ([employer]), via fax

## 2023-03-12 NOTE — Telephone Encounter (Signed)
 Hi I was checking to see if the dr has had time to sign the provider form yet? Thank you!

## 2023-03-13 NOTE — Telephone Encounter (Addendum)
 PAP: Patient assistance application for Marcelline Deist has been approved by PAP Companies: [employer] from 03/13/23 to 01/07/24. Medication should be delivered to PAP Delivery: Home. For further shipping updates, please contact [employer] ([employer]) at [phone]. Patient ID is: [id number] . They are asking to allow 7-10 business days for medication to ship.  I called the patient to notified him in a  message
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
## 2023-04-03 ENCOUNTER — Encounter: Payer: Self-pay | Admitting: Cardiology

## 2023-04-03 ENCOUNTER — Ambulatory Visit: Payer: PPO | Attending: Cardiology | Admitting: Cardiology

## 2023-04-03 VITALS — BP 130/78 | HR 50 | Ht 69.5 in | Wt 264.0 lb

## 2023-04-03 DIAGNOSIS — I10 Essential (primary) hypertension: Secondary | ICD-10-CM | POA: Diagnosis not present

## 2023-04-03 DIAGNOSIS — E785 Hyperlipidemia, unspecified: Secondary | ICD-10-CM

## 2023-04-03 DIAGNOSIS — R600 Localized edema: Secondary | ICD-10-CM | POA: Diagnosis not present

## 2023-04-03 DIAGNOSIS — I255 Ischemic cardiomyopathy: Secondary | ICD-10-CM

## 2023-04-03 DIAGNOSIS — I2510 Atherosclerotic heart disease of native coronary artery without angina pectoris: Secondary | ICD-10-CM | POA: Diagnosis not present

## 2023-04-03 LAB — COMPREHENSIVE METABOLIC PANEL WITH GFR
ALT: 20 IU/L (ref 0–44)
AST: 22 IU/L (ref 0–40)
Albumin: 4.5 g/dL (ref 3.8–4.8)
Alkaline Phosphatase: 89 IU/L (ref 44–121)
BUN/Creatinine Ratio: 12 (ref 10–24)
BUN: 16 mg/dL (ref 8–27)
Bilirubin Total: 0.6 mg/dL (ref 0.0–1.2)
CO2: 26 mmol/L (ref 20–29)
Calcium: 9.8 mg/dL (ref 8.6–10.2)
Chloride: 105 mmol/L (ref 96–106)
Creatinine, Ser: 1.3 mg/dL — ABNORMAL HIGH (ref 0.76–1.27)
Globulin, Total: 2.2 g/dL (ref 1.5–4.5)
Glucose: 106 mg/dL — ABNORMAL HIGH (ref 70–99)
Potassium: 5.7 mmol/L — ABNORMAL HIGH (ref 3.5–5.2)
Sodium: 144 mmol/L (ref 134–144)
Total Protein: 6.7 g/dL (ref 6.0–8.5)
eGFR: 58 mL/min/{1.73_m2} — ABNORMAL LOW (ref >59)

## 2023-04-03 NOTE — Patient Instructions (Signed)
 Medication Instructions:  Your physician recommends that you continue on your current medications as directed. Please refer to the Current Medication list given to you today.  *If you need a refill on your cardiac medications before your next appointment, please call your pharmacy*   Lab Work: Your physician recommends that you have labs drawn today: CMET  If you have labs (blood work) drawn today and your tests are completely normal, you will receive your results only by: MyChart Message (if you have MyChart) OR A paper copy in the mail If you have any lab test that is abnormal or we need to change your treatment, we will call you to review the results.   Follow-Up: At [facility], you and your health needs are our priority.  As part of our continuing mission to provide you with exceptional heart care, we have created designated Provider Care Teams.  These Care Teams include your primary Cardiologist (physician) and Advanced Practice Providers (APPs -  Physician Assistants and Nurse Practitioners) who all work together to provide you with the care you need, when you need it.  We recommend signing up for the patient portal called "MyChart".  Sign up information is provided on this After Visit Summary.  MyChart is used to connect with patients for Virtual Visits (Telemedicine).  Patients are able to view lab/test results, encounter notes, upcoming appointments, etc.  Non-urgent messages can be sent to your provider as well.   To learn more about what you can do with MyChart, go to [email or web address].    Your next appointment:   12 month(s)  Provider:   Olga Millers, MD      Other Instructions   1st Floor: - Lobby - Registration  - Pharmacy  - Lab - Cafe  2nd Floor: - PV Lab - Diagnostic Testing (echo, CT, nuclear med)  3rd Floor: - Vacant  4th Floor: - TCTS (cardiothoracic surgery) - AFib Clinic - Structural Heart Clinic - Vascular Surgery  - Vascular  Ultrasound  5th Floor: - [facility] (general and EP) - Clinical Pharmacy for coumadin, hypertension, lipid, weight-loss medications, and med management appointments    Valet parking services will be available as well.

## 2023-04-04 ENCOUNTER — Other Ambulatory Visit: Payer: Self-pay

## 2023-04-04 DIAGNOSIS — E875 Hyperkalemia: Secondary | ICD-10-CM

## 2023-04-05 ENCOUNTER — Other Ambulatory Visit: Payer: Self-pay | Admitting: Nurse Practitioner

## 2023-04-07 ENCOUNTER — Telehealth: Payer: Self-pay | Admitting: Cardiology

## 2023-04-07 DIAGNOSIS — I2510 Atherosclerotic heart disease of native coronary artery without angina pectoris: Secondary | ICD-10-CM

## 2023-04-07 MED ORDER — NITROGLYCERIN 0.4 MG SL SUBL: 0.4000 mg | SUBLINGUAL_TABLET | SUBLINGUAL | 11 refills | Status: AC | PRN

## 2023-04-07 NOTE — Telephone Encounter (Signed)
*  STAT* If patient is at the pharmacy, call can be transferred to refill team.   1. Which medications need to be refilled? (please list name of each medication and dose if known)  nitroGLYCERIN (NITROSTAT) 0.4 MG SL tablet  2. Which pharmacy/location (including street and city if local pharmacy) is medication to be sent to? [id number] - [place], [place] - [address]    3. Do they need a 30 day or 90 day supply?   Standard supply. Current Rx has expired.

## 2023-04-07 NOTE — Telephone Encounter (Signed)
 Pt's medication was sent to pt's pharmacy as requested. Confirmation received.

## 2023-04-11 ENCOUNTER — Ambulatory Visit: Payer: Medicare HMO | Admitting: Podiatry

## 2023-04-11 ENCOUNTER — Encounter: Payer: Self-pay | Admitting: Podiatry

## 2023-04-11 ENCOUNTER — Telehealth: Payer: Self-pay | Admitting: Cardiology

## 2023-04-11 DIAGNOSIS — M79674 Pain in right toe(s): Secondary | ICD-10-CM | POA: Diagnosis not present

## 2023-04-11 DIAGNOSIS — B351 Tinea unguium: Secondary | ICD-10-CM | POA: Diagnosis not present

## 2023-04-11 DIAGNOSIS — M79675 Pain in left toe(s): Secondary | ICD-10-CM

## 2023-04-11 DIAGNOSIS — E875 Hyperkalemia: Secondary | ICD-10-CM | POA: Diagnosis not present

## 2023-04-11 NOTE — Telephone Encounter (Signed)
 Pt requesting cb regarding earlier conversation about labs

## 2023-04-11 NOTE — Progress Notes (Signed)
 This patient returns to my office for at risk foot care.  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  This patient is unable to cut nails himself since the patient cannot reach his nails.These nails are painful walking and wearing shoes.  This patient presents for at risk foot care today.  General Appearance  Alert, conversant and in no acute stress.  Vascular  Dorsalis pedis and posterior tibial  pulses are palpable  bilaterally.  Capillary return is within normal limits  bilaterally. Temperature is within normal limits  bilaterally.  Neurologic  Senn-Weinstein monofilament wire test within normal limits  bilaterally. Muscle power within normal limits bilaterally.  Nails Thick disfigured discolored nails with subungual debris  from hallux to fifth toes bilaterally. No evidence of bacterial infection or drainage bilaterally.  Orthopedic  No limitations of motion  feet .  No crepitus or effusions noted.  No bony pathology or digital deformities noted.  Skin  normotropic skin with no porokeratosis noted bilaterally.  No signs of infections or ulcers noted.     Onychomycosis  Pain in right toes  Pain in left toes  Consent was obtained for treatment procedures.   Mechanical debridement of nails 1-5  bilaterally performed with a nail nipper.  Filed with dremel without incident.    Return office visit    10 weeks               Told patient to return for periodic foot care and evaluation due to potential at risk complications.   Helane Gunther DPM

## 2023-04-11 NOTE — Telephone Encounter (Signed)
 Spoke with pt, he wanted to wait until Monday for labs because he has not been drinking plenty of water. Advised patient it would be best to have drawn today if possible. Patient agreed.

## 2023-04-12 LAB — BASIC METABOLIC PANEL WITH GFR
BUN/Creatinine Ratio: 14 (ref 10–24)
BUN: 16 mg/dL (ref 8–27)
CO2: 23 mmol/L (ref 20–29)
Calcium: 9.6 mg/dL (ref 8.6–10.2)
Chloride: 103 mmol/L (ref 96–106)
Creatinine, Ser: 1.14 mg/dL (ref 0.76–1.27)
Glucose: 94 mg/dL (ref 70–99)
Potassium: 5 mmol/L (ref 3.5–5.2)
Sodium: 141 mmol/L (ref 134–144)
eGFR: 68 mL/min/{1.73_m2} (ref >59)

## 2023-04-24 DIAGNOSIS — Z96652 Presence of left artificial knee joint: Secondary | ICD-10-CM | POA: Diagnosis not present

## 2023-04-24 DIAGNOSIS — M25562 Pain in left knee: Secondary | ICD-10-CM | POA: Diagnosis not present

## 2023-04-28 DIAGNOSIS — Z6841 Body Mass Index (BMI) 40.0 and over, adult: Secondary | ICD-10-CM | POA: Diagnosis not present

## 2023-04-28 DIAGNOSIS — J301 Allergic rhinitis due to pollen: Secondary | ICD-10-CM | POA: Diagnosis not present

## 2023-05-07 NOTE — Telephone Encounter (Signed)
 Scanned application in media

## 2023-05-08 DIAGNOSIS — I700 Atherosclerosis of aorta: Secondary | ICD-10-CM | POA: Diagnosis not present

## 2023-05-08 DIAGNOSIS — I10 Essential (primary) hypertension: Secondary | ICD-10-CM | POA: Diagnosis not present

## 2023-05-13 DIAGNOSIS — A048 Other specified bacterial intestinal infections: Secondary | ICD-10-CM | POA: Diagnosis not present

## 2023-05-28 DIAGNOSIS — Z6841 Body Mass Index (BMI) 40.0 and over, adult: Secondary | ICD-10-CM | POA: Diagnosis not present

## 2023-05-28 DIAGNOSIS — H00022 Hordeolum internum right lower eyelid: Secondary | ICD-10-CM | POA: Diagnosis not present

## 2023-06-04 ENCOUNTER — Telehealth: Payer: Self-pay | Admitting: Pharmacy Technician

## 2023-06-04 NOTE — Telephone Encounter (Signed)
   Faxed information to [facility] like requested

## 2023-06-20 ENCOUNTER — Ambulatory Visit: Admitting: Podiatry

## 2023-06-20 ENCOUNTER — Encounter: Payer: Self-pay | Admitting: Podiatry

## 2023-06-20 DIAGNOSIS — M79674 Pain in right toe(s): Secondary | ICD-10-CM

## 2023-06-20 DIAGNOSIS — M79675 Pain in left toe(s): Secondary | ICD-10-CM

## 2023-06-20 DIAGNOSIS — B351 Tinea unguium: Secondary | ICD-10-CM

## 2023-06-20 NOTE — Progress Notes (Signed)
 This patient returns to my office for at risk foot care.  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  This patient is unable to cut nails himself since the patient cannot reach his nails.These nails are painful walking and wearing shoes.  This patient presents for at risk foot care today.  General Appearance  Alert, conversant and in no acute stress.  Vascular  Dorsalis pedis and posterior tibial  pulses are palpable  bilaterally.  Capillary return is within normal limits  bilaterally. Temperature is within normal limits  bilaterally.  Neurologic  Senn-Weinstein monofilament wire test within normal limits  bilaterally. Muscle power within normal limits bilaterally.  Nails Thick disfigured discolored nails with subungual debris  from hallux to fifth toes bilaterally. No evidence of bacterial infection or drainage bilaterally.  Orthopedic  No limitations of motion  feet .  No crepitus or effusions noted.  No bony pathology or digital deformities noted.  Skin  normotropic skin with no porokeratosis noted bilaterally.  No signs of infections or ulcers noted.     Onychomycosis  Pain in right toes  Pain in left toes  Consent was obtained for treatment procedures.   Mechanical debridement of nails 1-5  bilaterally performed with a nail nipper.  Filed with dremel without incident.    Return office visit    10 weeks               Told patient to return for periodic foot care and evaluation due to potential at risk complications.   Helane Gunther DPM

## 2023-07-16 ENCOUNTER — Telehealth: Payer: Self-pay | Admitting: Pharmacy Technician

## 2023-07-16 DIAGNOSIS — I8312 Varicose veins of left lower extremity with inflammation: Secondary | ICD-10-CM | POA: Diagnosis not present

## 2023-07-16 DIAGNOSIS — I890 Lymphedema, not elsewhere classified: Secondary | ICD-10-CM | POA: Diagnosis not present

## 2023-07-16 DIAGNOSIS — I83891 Varicose veins of right lower extremities with other complications: Secondary | ICD-10-CM | POA: Diagnosis not present

## 2023-07-16 DIAGNOSIS — M7989 Other specified soft tissue disorders: Secondary | ICD-10-CM | POA: Diagnosis not present

## 2023-07-16 DIAGNOSIS — M79661 Pain in right lower leg: Secondary | ICD-10-CM | POA: Diagnosis not present

## 2023-07-16 DIAGNOSIS — I83813 Varicose veins of bilateral lower extremities with pain: Secondary | ICD-10-CM | POA: Diagnosis not present

## 2023-07-28 DIAGNOSIS — R609 Edema, unspecified: Secondary | ICD-10-CM | POA: Diagnosis not present

## 2023-07-28 DIAGNOSIS — R0981 Nasal congestion: Secondary | ICD-10-CM | POA: Diagnosis not present

## 2023-07-28 DIAGNOSIS — M7989 Other specified soft tissue disorders: Secondary | ICD-10-CM | POA: Diagnosis not present

## 2023-07-28 DIAGNOSIS — Z6841 Body Mass Index (BMI) 40.0 and over, adult: Secondary | ICD-10-CM | POA: Diagnosis not present

## 2023-07-28 DIAGNOSIS — I868 Varicose veins of other specified sites: Secondary | ICD-10-CM | POA: Diagnosis not present

## 2023-09-03 ENCOUNTER — Encounter: Payer: Self-pay | Admitting: Podiatry

## 2023-09-03 ENCOUNTER — Ambulatory Visit: Admitting: Podiatry

## 2023-09-03 DIAGNOSIS — B351 Tinea unguium: Secondary | ICD-10-CM

## 2023-09-03 DIAGNOSIS — M79675 Pain in left toe(s): Secondary | ICD-10-CM

## 2023-09-03 DIAGNOSIS — M79674 Pain in right toe(s): Secondary | ICD-10-CM | POA: Diagnosis not present

## 2023-09-03 NOTE — Progress Notes (Signed)
 This patient returns to my office for at risk foot care.  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  This patient is unable to cut nails himself since the patient cannot reach his nails.These nails are painful walking and wearing shoes.  This patient presents for at risk foot care today.  General Appearance  Alert, conversant and in no acute stress.  Vascular  Dorsalis pedis and posterior tibial  pulses are palpable  bilaterally.  Capillary return is within normal limits  bilaterally. Temperature is within normal limits  bilaterally.  Neurologic  Senn-Weinstein monofilament wire test within normal limits  bilaterally. Muscle power within normal limits bilaterally.  Nails Thick disfigured discolored nails with subungual debris  from hallux to fifth toes bilaterally. No evidence of bacterial infection or drainage bilaterally.  Orthopedic  No limitations of motion  feet .  No crepitus or effusions noted.  No bony pathology or digital deformities noted.  Skin  normotropic skin with no porokeratosis noted bilaterally.  No signs of infections or ulcers noted.     Onychomycosis  Pain in right toes  Pain in left toes  Consent was obtained for treatment procedures.   Mechanical debridement of nails 1-5  bilaterally performed with a nail nipper.  Filed with dremel without incident.    Return office visit    10 weeks               Told patient to return for periodic foot care and evaluation due to potential at risk complications.   Helane Gunther DPM

## 2023-09-05 DIAGNOSIS — I890 Lymphedema, not elsewhere classified: Secondary | ICD-10-CM | POA: Diagnosis not present

## 2023-09-05 DIAGNOSIS — A048 Other specified bacterial intestinal infections: Secondary | ICD-10-CM | POA: Diagnosis not present

## 2023-09-12 ENCOUNTER — Telehealth: Payer: Self-pay | Admitting: Pharmacy Technician

## 2023-09-12 MED ORDER — DAPAGLIFLOZIN PROPANEDIOL 10 MG PO TABS: 10.0000 mg | ORAL_TABLET | Freq: Every day | ORAL | 6 refills | Status: AC

## 2023-09-12 NOTE — Addendum Note (Signed)
 Addended by: BLUFORD RAMP D on: 09/12/2023 10:24 AM   Modules accepted: Orders

## 2023-09-12 NOTE — Telephone Encounter (Signed)
 Hi, can a refill please be sent to [facility] for this patient's farxiga ? Thank you!

## 2023-09-12 NOTE — Telephone Encounter (Signed)
 Pt's medication was sent to pt's pharmacy as requested. Confirmation received.

## 2023-10-17 ENCOUNTER — Telehealth: Payer: Self-pay | Admitting: Pharmacy Technician

## 2023-10-17 NOTE — Telephone Encounter (Signed)
 SABRA

## 2023-10-22 DIAGNOSIS — M62830 Muscle spasm of back: Secondary | ICD-10-CM | POA: Diagnosis not present

## 2023-10-29 DIAGNOSIS — Z23 Encounter for immunization: Secondary | ICD-10-CM | POA: Diagnosis not present

## 2023-11-13 ENCOUNTER — Ambulatory Visit: Admitting: Podiatry

## 2023-11-13 ENCOUNTER — Encounter: Payer: Self-pay | Admitting: Podiatry

## 2023-11-13 DIAGNOSIS — M79674 Pain in right toe(s): Secondary | ICD-10-CM

## 2023-11-13 DIAGNOSIS — M79675 Pain in left toe(s): Secondary | ICD-10-CM

## 2023-11-13 DIAGNOSIS — B351 Tinea unguium: Secondary | ICD-10-CM | POA: Diagnosis not present

## 2023-11-13 NOTE — Progress Notes (Signed)
 This patient returns to my office for at risk foot care.  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  This patient is unable to cut nails himself since the patient cannot reach his nails.These nails are painful walking and wearing shoes.  This patient presents for at risk foot care today.  General Appearance  Alert, conversant and in no acute stress.  Vascular  Dorsalis pedis and posterior tibial  pulses are palpable  bilaterally.  Capillary return is within normal limits  bilaterally. Temperature is within normal limits  bilaterally.  Neurologic  Senn-Weinstein monofilament wire test within normal limits  bilaterally. Muscle power within normal limits bilaterally.  Nails Thick disfigured discolored nails with subungual debris  from hallux to fifth toes bilaterally. No evidence of bacterial infection or drainage bilaterally.  Orthopedic  No limitations of motion  feet .  No crepitus or effusions noted.  No bony pathology or digital deformities noted.  Skin  normotropic skin with no porokeratosis noted bilaterally.  No signs of infections or ulcers noted.     Onychomycosis  Pain in right toes  Pain in left toes  Consent was obtained for treatment procedures.   Mechanical debridement of nails 1-5  bilaterally performed with a nail nipper.  Filed with dremel without incident.    Return office visit    10 weeks               Told patient to return for periodic foot care and evaluation due to potential at risk complications.   Helane Gunther DPM

## 2023-11-26 ENCOUNTER — Telehealth: Payer: Self-pay | Admitting: Pharmacy Technician

## 2023-12-03 DIAGNOSIS — K3189 Other diseases of stomach and duodenum: Secondary | ICD-10-CM | POA: Diagnosis not present

## 2023-12-12 ENCOUNTER — Telehealth: Payer: Self-pay | Admitting: Cardiology

## 2023-12-12 NOTE — Telephone Encounter (Signed)
 Patient would like a c/b to see if he has to have labs done for his yearly visit please advise

## 2023-12-12 NOTE — Telephone Encounter (Signed)
Left message for patient with Dr Creshaw's recommendations.   

## 2024-01-14 ENCOUNTER — Other Ambulatory Visit (HOSPITAL_COMMUNITY): Payer: Self-pay | Admitting: Family Medicine

## 2024-01-14 ENCOUNTER — Ambulatory Visit (HOSPITAL_COMMUNITY)
Admission: RE | Admit: 2024-01-14 | Discharge: 2024-01-14 | Disposition: A | Discharge status: Home / Self Care | Source: Ambulatory Visit | Attending: Vascular Surgery | Admitting: Vascular Surgery

## 2024-01-14 DIAGNOSIS — M79604 Pain in right leg: Secondary | ICD-10-CM | POA: Diagnosis present

## 2024-01-22 ENCOUNTER — Ambulatory Visit: Admitting: Podiatry

## 2024-01-29 ENCOUNTER — Encounter: Payer: Self-pay | Admitting: Podiatry

## 2024-01-29 ENCOUNTER — Ambulatory Visit: Admitting: Podiatry

## 2024-01-29 DIAGNOSIS — B351 Tinea unguium: Secondary | ICD-10-CM

## 2024-01-29 DIAGNOSIS — M79674 Pain in right toe(s): Secondary | ICD-10-CM | POA: Diagnosis not present

## 2024-01-29 DIAGNOSIS — L600 Ingrowing nail: Secondary | ICD-10-CM

## 2024-01-29 DIAGNOSIS — M79675 Pain in left toe(s): Secondary | ICD-10-CM | POA: Diagnosis not present

## 2024-01-29 NOTE — Progress Notes (Signed)
 This patient returns to my office for at risk foot care.  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  This patient is unable to cut nails himself since the patient cannot reach his nails.These nails are painful walking and wearing shoes.  This patient presents for at risk foot care today.  General Appearance  Alert, conversant and in no acute stress.  Vascular  Dorsalis pedis and posterior tibial  pulses are palpable  bilaterally.  Capillary return is within normal limits  bilaterally. Temperature is within normal limits  bilaterally.  Neurologic  Senn-Weinstein monofilament wire test within normal limits  bilaterally. Muscle power within normal limits bilaterally.  Nails Thick disfigured discolored nails with subungual debris  from hallux to fifth toes bilaterally. No evidence of bacterial infection or drainage bilaterally.  Orthopedic  No limitations of motion  feet .  No crepitus or effusions noted.  No bony pathology or digital deformities noted.  Skin  normotropic skin with no porokeratosis noted bilaterally.  No signs of infections or ulcers noted.     Onychomycosis  Pain in right toes  Pain in left toes  Consent was obtained for treatment procedures.   Mechanical debridement of nails 1-5  bilaterally performed with a nail nipper.  Filed with dremel without incident.    Return office visit    10 weeks               Told patient to return for periodic foot care and evaluation due to potential at risk complications.   Helane Gunther DPM

## 2024-02-04 ENCOUNTER — Telehealth: Payer: Self-pay | Admitting: Podiatry

## 2024-02-04 NOTE — Telephone Encounter (Signed)
 Spoke to pt this evening.  Pt was seen 1/22, is concerned toenails were cut to short and had sharp points on corners.There was blood on socks when he got home. Toes are irritated.  Will call back on Friday if continues to have problems.

## 2024-02-09 ENCOUNTER — Telehealth: Payer: Self-pay | Admitting: Pharmacy Technician

## 2024-02-09 NOTE — Telephone Encounter (Signed)
   FARXIGA  [employer]

## 2024-04-01 ENCOUNTER — Ambulatory Visit: Payer: Self-pay | Admitting: Cardiology

## 2024-04-01 ENCOUNTER — Ambulatory Visit: Admitting: Cardiology

## 2024-04-08 ENCOUNTER — Ambulatory Visit: Admitting: Podiatry

## 2024-04-21 ENCOUNTER — Encounter

## 2024-04-21 ENCOUNTER — Ambulatory Visit (HOSPITAL_COMMUNITY)

## 2024-05-11 ENCOUNTER — Ambulatory Visit: Admitting: Podiatry
